# Patient Record
Sex: Female | Born: 1940
Health system: Southern US, Community
[De-identification: ages and names within clinical notes are randomized; demographics above are authoritative.]

## PROBLEM LIST (undated history)

## (undated) DIAGNOSIS — J189 Pneumonia, unspecified organism: Secondary | ICD-10-CM

## (undated) DIAGNOSIS — I10 Essential (primary) hypertension: Secondary | ICD-10-CM

## (undated) DIAGNOSIS — C801 Malignant (primary) neoplasm, unspecified: Secondary | ICD-10-CM

## (undated) DIAGNOSIS — D509 Iron deficiency anemia, unspecified: Secondary | ICD-10-CM

## (undated) DIAGNOSIS — K922 Gastrointestinal hemorrhage, unspecified: Secondary | ICD-10-CM

## (undated) DIAGNOSIS — E7800 Pure hypercholesterolemia, unspecified: Secondary | ICD-10-CM

## (undated) DIAGNOSIS — K219 Gastro-esophageal reflux disease without esophagitis: Secondary | ICD-10-CM

## (undated) HISTORY — PX: COLONOSCOPY: SHX174

## (undated) HISTORY — PX: TONSILLECTOMY: SUR1361

## (undated) HISTORY — DX: Pneumonia, unspecified organism: J18.9

---

## 1998-12-08 ENCOUNTER — Inpatient Hospital Stay (HOSPITAL_COMMUNITY): Admission: AD | Admit: 1998-12-08 | Discharge: 1998-12-09 | Payer: Self-pay | Admitting: Family Medicine

## 1998-12-08 ENCOUNTER — Encounter: Payer: Self-pay | Admitting: Family Medicine

## 1998-12-11 ENCOUNTER — Encounter: Admission: RE | Admit: 1998-12-11 | Discharge: 1998-12-11 | Payer: Self-pay | Admitting: Family Medicine

## 2000-11-07 ENCOUNTER — Inpatient Hospital Stay (HOSPITAL_COMMUNITY): Admission: EM | Admit: 2000-11-07 | Discharge: 2000-11-08 | Payer: Self-pay | Admitting: *Deleted

## 2000-11-07 ENCOUNTER — Encounter: Payer: Self-pay | Admitting: Emergency Medicine

## 2010-07-05 HISTORY — PX: TOTAL HIP ARTHROPLASTY: SHX124

## 2016-01-05 ENCOUNTER — Observation Stay (HOSPITAL_COMMUNITY)
Admission: AD | Admit: 2016-01-05 | Discharge: 2016-01-07 | Disposition: A | Discharge status: Home / Self Care | Payer: Commercial Managed Care - HMO | Source: Other Acute Inpatient Hospital | Attending: Internal Medicine | Admitting: Internal Medicine

## 2016-01-05 DIAGNOSIS — I159 Secondary hypertension, unspecified: Secondary | ICD-10-CM | POA: Diagnosis not present

## 2016-01-05 DIAGNOSIS — K922 Gastrointestinal hemorrhage, unspecified: Principal | ICD-10-CM | POA: Diagnosis present

## 2016-01-05 DIAGNOSIS — D62 Acute posthemorrhagic anemia: Secondary | ICD-10-CM | POA: Diagnosis present

## 2016-01-05 DIAGNOSIS — I10 Essential (primary) hypertension: Secondary | ICD-10-CM | POA: Diagnosis present

## 2016-01-05 DIAGNOSIS — Z96641 Presence of right artificial hip joint: Secondary | ICD-10-CM | POA: Diagnosis not present

## 2016-01-05 HISTORY — DX: Essential (primary) hypertension: I10

## 2016-01-06 ENCOUNTER — Encounter (HOSPITAL_COMMUNITY): Payer: Self-pay | Admitting: Family Medicine

## 2016-01-06 ENCOUNTER — Ambulatory Visit: Payer: Self-pay | Admitting: Gastroenterology

## 2016-01-06 DIAGNOSIS — I10 Essential (primary) hypertension: Secondary | ICD-10-CM | POA: Diagnosis present

## 2016-01-06 DIAGNOSIS — D509 Iron deficiency anemia, unspecified: Secondary | ICD-10-CM | POA: Diagnosis not present

## 2016-01-06 DIAGNOSIS — Z96641 Presence of right artificial hip joint: Secondary | ICD-10-CM | POA: Diagnosis not present

## 2016-01-06 DIAGNOSIS — D62 Acute posthemorrhagic anemia: Secondary | ICD-10-CM | POA: Diagnosis present

## 2016-01-06 DIAGNOSIS — K922 Gastrointestinal hemorrhage, unspecified: Secondary | ICD-10-CM | POA: Diagnosis present

## 2016-01-06 LAB — CBC
HEMATOCRIT: 27.5 % — AB (ref 36.0–46.0)
HEMATOCRIT: 29.2 % — AB (ref 36.0–46.0)
HEMOGLOBIN: 9.1 g/dL — AB (ref 12.0–15.0)
Hemoglobin: 8.6 g/dL — ABNORMAL LOW (ref 12.0–15.0)
MCH: 24 pg — ABNORMAL LOW (ref 26.0–34.0)
MCH: 23.9 pg — AB (ref 26.0–34.0)
MCHC: 31.3 g/dL (ref 30.0–36.0)
MCHC: 31.2 g/dL (ref 30.0–36.0)
MCV: 76.6 fL — ABNORMAL LOW (ref 78.0–100.0)
MCV: 76.6 fL — ABNORMAL LOW (ref 78.0–100.0)
Platelets: 174 10*3/uL (ref 150–400)
Platelets: 175 10*3/uL (ref 150–400)
RBC: 3.59 MIL/uL — AB (ref 3.87–5.11)
RBC: 3.81 MIL/uL — AB (ref 3.87–5.11)
RDW: 16.3 % — AB (ref 11.5–15.5)
RDW: 16.4 % — ABNORMAL HIGH (ref 11.5–15.5)
WBC: 4 10*3/uL (ref 4.0–10.5)
WBC: 4.4 10*3/uL (ref 4.0–10.5)

## 2016-01-06 LAB — MAGNESIUM: MAGNESIUM: 2 mg/dL (ref 1.7–2.4)

## 2016-01-06 LAB — BASIC METABOLIC PANEL
Anion gap: 6 (ref 5–15)
BUN: 12 mg/dL (ref 6–20)
CHLORIDE: 111 mmol/L (ref 101–111)
CO2: 24 mmol/L (ref 22–32)
CREATININE: 0.78 mg/dL (ref 0.44–1.00)
Calcium: 8.4 mg/dL — ABNORMAL LOW (ref 8.9–10.3)
GFR calc Af Amer: >60 mL/min (ref >60)
GLUCOSE: 106 mg/dL — AB (ref 65–99)
Potassium: 3.1 mmol/L — ABNORMAL LOW (ref 3.5–5.1)
SODIUM: 141 mmol/L (ref 135–145)

## 2016-01-06 LAB — GLUCOSE, CAPILLARY: GLUCOSE-CAPILLARY: 102 mg/dL — AB (ref 65–99)

## 2016-01-06 MED ORDER — ACETAMINOPHEN 325 MG PO TABS: 650.0000 mg | ORAL_TABLET | Freq: Four times a day (QID) | ORAL | Status: DC | PRN

## 2016-01-06 MED ORDER — ACETAMINOPHEN 650 MG RE SUPP: 650.0000 mg | Freq: Four times a day (QID) | RECTAL | Status: DC | PRN

## 2016-01-06 MED ORDER — POLYETHYLENE GLYCOL 3350 17 GM/SCOOP PO POWD
1.0000 | Freq: Once | ORAL | Status: AC
  Administered 2016-01-06: 255 g via ORAL
  Filled 2016-01-06: qty 255

## 2016-01-06 MED ORDER — SODIUM CHLORIDE 0.9 % IV SOLN
INTRAVENOUS | Status: DC
  Administered 2016-01-06: 13:00:00 via INTRAVENOUS

## 2016-01-06 MED ORDER — ONDANSETRON HCL 4 MG/2ML IJ SOLN: 4.0000 mg | Freq: Four times a day (QID) | INTRAMUSCULAR | Status: DC | PRN

## 2016-01-06 MED ORDER — POTASSIUM CHLORIDE CRYS ER 20 MEQ PO TBCR
40.0000 meq | EXTENDED_RELEASE_TABLET | ORAL | Status: AC
  Administered 2016-01-06 (×2): 40 meq via ORAL
  Filled 2016-01-06: qty 2
  Filled 2016-01-06: qty 4

## 2016-01-06 MED ORDER — SODIUM CHLORIDE 0.9 % IV SOLN
INTRAVENOUS | Status: DC
  Administered 2016-01-06: 03:00:00 via INTRAVENOUS

## 2016-01-06 MED ORDER — ONDANSETRON HCL 4 MG PO TABS
4.0000 mg | ORAL_TABLET | Freq: Four times a day (QID) | ORAL | Status: DC | PRN
  Administered 2016-01-06: 4 mg via ORAL
  Filled 2016-01-06: qty 1

## 2016-01-06 MED ORDER — AMLODIPINE BESYLATE 5 MG PO TABS
5.0000 mg | ORAL_TABLET | Freq: Every day | ORAL | Status: DC
  Administered 2016-01-06 – 2016-01-07 (×2): 5 mg via ORAL
  Filled 2016-01-06 (×3): qty 1

## 2016-01-06 MED ORDER — PEG 3350-KCL-NA BICARB-NACL 420 G PO SOLR
4000.0000 mL | Freq: Once | ORAL | Status: DC
Start: 2016-01-06 — End: 2016-01-06
  Administered 2016-01-06: 4000 mL via ORAL
  Filled 2016-01-06: qty 4000

## 2016-01-06 NOTE — H&P (Signed)
History and Physical  Patient Name: Elizabeth Durham     IBB:[id number]    DOB: 03-03-1941    DOA: 01/05/2016 PCP: Arlyss Queen   Patient coming from: Home  Chief Complaint: Rectal bleeding  HPI: Elizabeth Durham is a 75 y.o. female with a past medical history significant for HTN who presents with rectal bleeding.  The patient was in her usual state of health until last week when she was wiping and "there was a blood clot on the toilet tissue."  This resolved that day, but two days ago, she had several loose bowel movements and there was bright red blood "squirting in the bowl" and filling the commode.  Then yesterday (Monday) she had blood mixed with the bowel movement, went to her PCP, who sent her straight to the ER.  ED course: -Afebrile, hemodynamically stable, hypertensive, saturating well on room air -Na 142, K 3.3, BUN 22, Cr 0.9, Hgb 8.3 -She was given a PPI, the case was declined by the local hospitalist given lack of GI coverage at [facility], discussed with Dr. Matthias Hughs of [facility], and transferred after transfusion of 1U PRBCs -She had one further bright red bowel movement on arrival to [facility], not observed by nursing     ROS: Pt complains of rectal bleeding, loose stools.  Pt denies any melena, hematemesis, dyspnea on exertion, chest pressure, abdominal discomfort.    All other systems negative except as just noted or noted in the history of present illness.    Past Medical History  Diagnosis Date  . Essential hypertension     Past Surgical History  Procedure Laterality Date  . Total hip arthroplasty Right 2012    Social History: Patient lives With her brother.  The patient walks unassisted. She does not smoke. She is not using alcohol. She does not use a cane. She does not work. Her daughter lives in [place].    No Known Allergies  Family history: family history includes Leukemia in her mother.  Prior to Admission medications   Not on File        Physical Exam: BP 194/96 mmHg  Pulse 63  Temp(Src) 98 F (36.7 C) (Oral)  Resp 16  Ht 5' 4.8" (1.646 m)  Wt 51.256 kg (113 lb)  BMI 18.92 kg/m2  SpO2 100% General appearance: Well-developed, elderly female, alert and in no acute distress.   Eyes: Conjunctiva normal, lids and lashes normal.   PERRL, right esotropia.  ENT: No nasal deformity, discharge.  OP moist without lesions.  Dentures in place. Lymph: No cervical or supraclavicular lymphadenopathy. Skin: Warm and dry.  No suspicious rashes or lesions. Cardiac: RRR, nl S1-S2, no murmurs appreciated.  Capillary refill is brisk.  JVP normal.  No LE edema.  Radial and DP pulses 2+ and symmetric. Respiratory: Normal respiratory rate and rhythm.  CTAB without rales or wheezes. GI: Abdomen soft without rigidity.  No TTP. No ascites, distension, hepatosplenomegaly.   Rectal: There is a small noninflamed exterior hemorrhoid, bright red blood around the anus. MSK: No deformities or effusions.  No clubbing/cyanosis. Neuro: Cranial nerves normal.  Sensorium intact and responding to questions, attention normal.  Speech is fluent.  Moves all extremities equally and with normal coordination.    Psych: Affect normal.  Judgment and insight appear normal.       Labs on Admission:  I have personally reviewed following labs and imaging studies: CBC: No results for input(s): WBC, NEUTROABS, HGB, HCT, MCV, PLT in the last 168  hours. Basic Metabolic Panel: No results for input(s): NA, K, CL, CO2, GLUCOSE, BUN, CREATININE, CALCIUM, MG, PHOS in the last 168 hours. GFR: CrCl cannot be calculated (Patient has no serum creatinine result on file.).  Liver Function Tests: No results for input(s): AST, ALT, ALKPHOS, BILITOT, PROT, ALBUMIN in the last 168 hours. No results for input(s): LIPASE, AMYLASE in the last 168 hours. No results for input(s): AMMONIA in the last 168 hours. Coagulation Profile: No results for input(s): INR, PROTIME in the  last 168 hours. Cardiac Enzymes: No results for input(s): CKTOTAL, CKMB, CKMBINDEX, TROPONINI in the last 168 hours. BNP (last 3 results) No results for input(s): PROBNP in the last 8760 hours. HbA1C: No results for input(s): HGBA1C in the last 72 hours. CBG: No results for input(s): GLUCAP in the last 168 hours. Lipid Profile: No results for input(s): CHOL, HDL, LDLCALC, TRIG, CHOLHDL, LDLDIRECT in the last 72 hours. Thyroid Function Tests: No results for input(s): TSH, T4TOTAL, FREET4, T3FREE, THYROIDAB in the last 72 hours. Anemia Panel: No results for input(s): VITAMINB12, FOLATE, FERRITIN, TIBC, IRON, RETICCTPCT in the last 72 hours. Sepsis Labs:  Invalid input(s): PROCALCITONIN, LACTICIDVEN No results found for this or any previous visit (from the past 240 hour(s)).       Radiological Exams on Admission: Personally reviewed: No results found.      Assessment/Plan  1. GI bleed:  Diverticular bleed versus internal hemorrhoid (although nothing observed on my exam).  Ischemic or infectious colitis seem less likely. -Serial hemoglobin q12 hrs -NPO and MIVF -[facility] consulted, appreciate cares   2. HTN:  Not on medications at home.  3. Acute blood loss anemia:  Transfused 1 unit at OSH.   -Serial hemoglobins      DVT prophylaxis: SCDs  Code Status: FULL  Family Communication: Daughter at bedside  Disposition Plan: Anticipate serial hemoglobins and monitor for further bleeding.  Consult to GI, appreciate cares. Consults called: Gastroenterology called by OSH Admission status: OBS, med surg At the point of initial evaluation, it is my clinical opinion that admission for OBSERVATION is reasonable and necessary because the patient's presenting complaints in the context of their chronic conditions represent sufficient risk of deterioration or significant morbidity to constitute reasonable grounds for close observation in the hospital setting, but that the patient  may be medically stable for discharge from the hospital within 24 to 48 hours.    Medical decision making: Patient seen at 1:58 AM on 01/06/2016.  The patient was discussed with Ezra Sites, PA-C at [place]. What exists of the patient's chart was reviewed in depth.  Clinical condition: stable.        Edwin Dada [facility] Pager [phone]

## 2016-01-06 NOTE — Care Management Obs Status (Signed)
[place] NOTIFICATION   Patient Details  Name: Elizabeth Durham MRN: [id number] Date of Birth: 22-Dec-1940   Medicare Observation Status Notification Given:  Yes    Sharin Mons, RN 01/06/2016, 1:42 PM

## 2016-01-06 NOTE — Progress Notes (Signed)
I have seen and assessed the patient and agree with Dr. Sabino Niemann assessment and plan. Patient is a 75 year old female transferred from [facility] due to lack of GI coverage to [facility] for rectal bleeding. Likely diverticular versus hemorrhoidal bleeding. GI has been consulted and patient will be scheduled for inpatient colonoscopy for further evaluation tomorrow. Patient currently on clear liquid diet. Follow.

## 2016-01-06 NOTE — Progress Notes (Signed)
Pt presented with a pitcher of 20 ounces Gatorade and 1/3 the Miralax prep ordered for colonoscopy prep--pt consumed the entire amount in 1 hour and c/o being chilly and was shaking--she began the second 20 ounces of Gatorade with miralax and within a few minutes she vomited approximately 2 cups of the fluid--she states she feels a bit better=less cold and she is continuing to attemp to consume the second pitcher of prep

## 2016-01-06 NOTE — Progress Notes (Signed)
Pt unable to tolerate original ordered prep. Schooler contacted and gave verbal orders to change to Miralax prep with clears.

## 2016-01-06 NOTE — Consult Note (Signed)
Referring Provider: Dr. Janee Morn Primary Care Physician:  Arlyss Queen Primary Gastroenterologist:  Gentry Fitz  Reason for Consultation:  GI Bleed  HPI: Elizabeth Durham is a 75 y.o. female transferred from [facility] due to lack of GI coverage being seen for a consult due to rectal bleeding. Reports onset of red blood per rectum everyday last week where she would see clots with wiping. Saw dark red blood this morning since admit. Denies melena. Denies abdominal pain, nausea, vomiting, dizziness. Rare NSAIDs use. Denies alcohol. Has never had a colonoscopy. Daughter at bedside. Hgb 8.6.    Past Medical History  Diagnosis Date  . Essential hypertension     Past Surgical History  Procedure Laterality Date  . Total hip arthroplasty Right 2012    Prior to Admission medications   Medication Sig Start Date End Date Taking? Authorizing Provider  naproxen sodium (ANAPROX) 220 MG tablet Take 220 mg by mouth 2 (two) times daily with a meal.   Yes Historical Provider, MD    Scheduled Meds: . potassium chloride  40 mEq Oral Q4H   Continuous Infusions: . sodium chloride 100 mL/hr at 01/06/16 0250   PRN Meds:.acetaminophen **OR** acetaminophen, ondansetron **OR** ondansetron (ZOFRAN) IV  Allergies as of 01/05/2016  . (Not on File)    Family History  Problem Relation Age of Onset  . Leukemia Mother     Social History   Social History  . Marital Status: Married    Spouse Name: N/A  . Number of Children: N/A  . Years of Education: N/A   Occupational History  . Not on file.   Social History Main Topics  . Smoking status: Never Smoker   . Smokeless tobacco: Not on file  . Alcohol Use: No  . Drug Use: Not on file  . Sexual Activity: Not on file   Other Topics Concern  . Not on file   Social History Narrative  . No narrative on file    Review of Systems: All negative except as stated above in HPI.  Physical Exam: Vital signs: Filed Vitals:   01/06/16  0100 01/06/16 0530  BP:  132/69  Pulse: 69 56  Temp:  98.1 F (36.7 C)  Resp:  16   Last BM Date: 01/05/16 General:   Alert,  Thin, elderly, pleasant and cooperative in NAD HEENT: anicteric sclera, oropharynx clear Neck: supple, nontender Lungs:  Clear throughout to auscultation.   No wheezes, crackles, or rhonchi. No acute distress. Heart:  Regular rate and rhythm; no murmurs, clicks, rubs,  or gallops. Abdomen: soft, nontender, nondistended, +BS  Rectal:  Deferred Ext: no edema  GI:  Lab Results:  Recent Labs  01/06/16 0526  WBC 4.0  HGB 8.6*  HCT 27.5*  PLT 174   BMET  Recent Labs  01/06/16 0526  NA 141  K 3.1*  CL 111  CO2 24  GLUCOSE 106*  BUN 12  CREATININE 0.78  CALCIUM 8.4*   LFT No results for input(s): PROT, ALBUMIN, AST, ALT, ALKPHOS, BILITOT, BILIDIR, IBILI in the last 72 hours. PT/INR No results for input(s): LABPROT, INR in the last 72 hours.   Studies/Results: No results found.  Impression/Plan: Rectal bleeding likely diverticular in origin but malignancy still possible. Will recommend an inpt colonoscopy to further evaluate. Clear liquid diet. Colon prep this afternoon. Colonoscopy tomorrow. NPO p MN.    LOS: 1 day   Stuart Guillen C.  01/06/2016, 11:28 AM  Pager [phone]  If no answer or after 5 PM  call [phone]

## 2016-01-07 ENCOUNTER — Encounter (HOSPITAL_COMMUNITY)
Admission: AD | Disposition: A | Discharge status: Home / Self Care | Payer: Self-pay | Source: Other Acute Inpatient Hospital | Attending: Internal Medicine

## 2016-01-07 DIAGNOSIS — D62 Acute posthemorrhagic anemia: Secondary | ICD-10-CM | POA: Diagnosis not present

## 2016-01-07 DIAGNOSIS — Z96641 Presence of right artificial hip joint: Secondary | ICD-10-CM | POA: Diagnosis not present

## 2016-01-07 DIAGNOSIS — K922 Gastrointestinal hemorrhage, unspecified: Secondary | ICD-10-CM

## 2016-01-07 DIAGNOSIS — D509 Iron deficiency anemia, unspecified: Secondary | ICD-10-CM | POA: Diagnosis not present

## 2016-01-07 DIAGNOSIS — I10 Essential (primary) hypertension: Secondary | ICD-10-CM | POA: Diagnosis not present

## 2016-01-07 LAB — BASIC METABOLIC PANEL
ANION GAP: 8 (ref 5–15)
BUN: 9 mg/dL (ref 6–20)
CHLORIDE: 108 mmol/L (ref 101–111)
CO2: 25 mmol/L (ref 22–32)
CREATININE: 0.9 mg/dL (ref 0.44–1.00)
Calcium: 9.5 mg/dL (ref 8.9–10.3)
GFR calc non Af Amer: >60 mL/min (ref >60)
GLUCOSE: 110 mg/dL — AB (ref 65–99)
Potassium: 4.1 mmol/L (ref 3.5–5.1)
Sodium: 141 mmol/L (ref 135–145)

## 2016-01-07 LAB — CBC
HEMATOCRIT: 32.3 % — AB (ref 36.0–46.0)
HEMOGLOBIN: 9.9 g/dL — AB (ref 12.0–15.0)
MCH: 24 pg — ABNORMAL LOW (ref 26.0–34.0)
MCHC: 30.7 g/dL (ref 30.0–36.0)
MCV: 78.2 fL (ref 78.0–100.0)
Platelets: 202 10*3/uL (ref 150–400)
RBC: 4.13 MIL/uL (ref 3.87–5.11)
RDW: 16.3 % — AB (ref 11.5–15.5)
WBC: 6.4 10*3/uL (ref 4.0–10.5)

## 2016-01-07 LAB — GLUCOSE, CAPILLARY
Glucose-Capillary: 110 mg/dL — ABNORMAL HIGH (ref 65–99)
Glucose-Capillary: 102 mg/dL — ABNORMAL HIGH (ref 65–99)

## 2016-01-07 LAB — MAGNESIUM: Magnesium: 2 mg/dL (ref 1.7–2.4)

## 2016-01-07 SURGERY — COLONOSCOPY
Anesthesia: Monitor Anesthesia Care

## 2016-01-07 MED ORDER — AMLODIPINE BESYLATE 5 MG PO TABS: 5.0000 mg | ORAL_TABLET | Freq: Every day | ORAL | Status: DC

## 2016-01-07 MED ORDER — PROMETHAZINE HCL 12.5 MG PO TABS: 12.5000 mg | ORAL_TABLET | Freq: Four times a day (QID) | ORAL | Status: DC | PRN

## 2016-01-07 MED ORDER — PANTOPRAZOLE SODIUM 40 MG PO TBEC: 40.0000 mg | DELAYED_RELEASE_TABLET | Freq: Every day | ORAL | Status: DC

## 2016-01-07 NOTE — Progress Notes (Signed)
Patient ID: Elizabeth Durham, female   DOB: 1941-06-18, 75 y.o.   MRN: [id number] [facility] Gastroenterology Progress Note  Elizabeth Durham 75 y.o. 27-Sep-1940   Subjective: Could not tolerate colon preps without vomiting and no BMs. Denies further bleeding. Feels ok now.  Objective: Vital signs in last 24 hours: Filed Vitals:   01/06/16 2123 01/07/16 0619  BP: 139/79 134/77  Pulse: 61 66  Temp: 98 F (36.7 C) 98.4 F (36.9 C)  Resp: 18 18    Physical Exam: Gen: alert, no acute distress CV: RRR Chest: CTA B Abd: soft, nontender, nondistended, +BS  Lab Results:  Recent Labs  01/06/16 0526 01/06/16 0840 01/07/16 0528  NA 141  --  141  K 3.1*  --  4.1  CL 111  --  108  CO2 24  --  25  GLUCOSE 106*  --  110*  BUN 12  --  9  CREATININE 0.78  --  0.90  CALCIUM 8.4*  --  9.5  MG  --  2.0 2.0   No results for input(s): AST, ALT, ALKPHOS, BILITOT, PROT, ALBUMIN in the last 72 hours.  Recent Labs  01/06/16 1631 01/07/16 0528  WBC 4.4 6.4  HGB 9.1* 9.9*  HCT 29.2* 32.3*  MCV 76.6* 78.2  PLT 175 202   No results for input(s): LABPROT, INR in the last 72 hours.    Assessment/Plan: Resolved lower GI bleed likely diverticular. Hgb stable . Colonoscopy cancelled due to inability to tolerate prep and since bleeding has resolved will do procedure as an outpt with a lower volume prep. Changed to regular diet. Ok to go home if she can tolerate lunch without vomiting. Will have office set her up to see me in August to arrange outpt colon. Will sign off. Call if questions.   Crandon C. 01/07/2016, 9:17 AM  Pager [phone]  If no answer or after 5 PM call [phone]

## 2016-01-07 NOTE — Progress Notes (Signed)
Nsg Discharge Note  Admit Date:  01/05/2016 Discharge date: 01/07/2016   Elizabeth Durham to be D/C'd Home per MD order.  AVS completed.  Copy for chart, and copy for patient signed, and dated. Patient/caregiver able to verbalize understanding.  Discharge Medication:   Medication List    STOP taking these medications        naproxen sodium 220 MG tablet  Commonly known as:  ANAPROX      TAKE these medications        amLODipine 5 MG tablet  Commonly known as:  NORVASC  Take 1 tablet (5 mg total) by mouth daily.     pantoprazole 40 MG tablet  Commonly known as:  PROTONIX  Take 1 tablet (40 mg total) by mouth daily.     promethazine 12.5 MG tablet  Commonly known as:  PHENERGAN  Take 1 tablet (12.5 mg total) by mouth every 6 (six) hours as needed for nausea or vomiting.        Discharge Assessment: Filed Vitals:   01/06/16 2123 01/07/16 0619  BP: 139/79 134/77  Pulse: 61 66  Temp: 98 F (36.7 C) 98.4 F (36.9 C)  Resp: 18 18   Skin clean, dry and intact without evidence of skin break down, no evidence of skin tears noted. IV catheter discontinued intact. Site without signs and symptoms of complications - no redness or edema noted at insertion site, patient denies c/o pain - only slight tenderness at site.  Dressing with slight pressure applied.  D/c Instructions-Education: Discharge instructions given to patient/family with verbalized understanding. D/c education completed with patient/family including follow up instructions, medication list, d/c activities limitations if indicated, with other d/c instructions as indicated by MD - patient able to verbalize understanding, all questions fully answered. Patient instructed to return to ED, call 911, or call MD for any changes in condition.  Patient escorted via [place], and D/C home via private auto.  Dayle Points, RN 01/07/2016 1:21 PM

## 2016-01-07 NOTE — Progress Notes (Signed)
Call to Dr Michail Sermon at this time to report pt was unsuccessful in completing the oral prep for her colonoscopy scheduled for today.  Pt did make a serious attempt to complete the prep but did nothing but vomit it all back up until she finally refused to try anymore.  Dr Michail Sermon stated he wi;; call Endo to cancel the procedure.

## 2016-01-07 NOTE — Discharge Summary (Signed)
Physician Discharge Summary   Patient ID: SADIELYNN ONION MRN: [id number] DOB/AGE: [birth year] 75 y.o.  Admit date: 01/05/2016 Discharge date: 01/07/2016  Primary Care Physician:  Fae Pippin  Discharge Diagnoses:    . Essential hypertension . GI bleed . Acute blood loss anemia  Consults:Gastroenterology  Recommendations for Outpatient Follow-up:  1. Patient to follow up with GI, Dr. Michail Sermon outpatient in 2 weeks to schedule colonoscopy 2. Please repeat CBC/BMET at next visit 3. Please note patient started on amlodipine this admission 5 mg daily naproxen has been discontinued 4. Patient also placed on PPI daily   DIET: Heart healthy diet    Allergies:  No Known Allergies   DISCHARGE MEDICATIONS: Current Discharge Medication List    START taking these medications   Details  amLODipine (NORVASC) 5 MG tablet Take 1 tablet (5 mg total) by mouth daily. Qty: 30 tablet, Refills: 3    pantoprazole (PROTONIX) 40 MG tablet Take 1 tablet (40 mg total) by mouth daily. Qty: 30 tablet, Refills: 0    promethazine (PHENERGAN) 12.5 MG tablet Take 1 tablet (12.5 mg total) by mouth every 6 (six) hours as needed for nausea or vomiting. Qty: 30 tablet, Refills: 0      STOP taking these medications     naproxen sodium (ANAPROX) 220 MG tablet          Brief H and P: For complete details please refer to admission H and P, but in brief Patient is a 75 year old female with hypertension presented with rectal bleeding. Patient was in her usual state of health until last week when she noticed she was wiping and there was bleeding on the toilet tissue. This was resolved however 2 days before the admission she had several loose bowel movements with bright red blood. Patient went to her PCP and was sent to the ER. Patient was transferred from [facility]. She was admitted for further workup.  Hospital Course:     GI bleed: Possibly diverticular bleeding versus internal  hemorrhoid, resolved.  - Bleeding has resolved. Serial hemoglobin was checked and remained stable. Hemoglobin 9.9 at the time of discharge. GI was consulted and recommended colonoscopy. Patient however was not able to tolerate the colonoscopy prep and since the bleeding had resolved, GI, Dr. Michail Sermon recommended outpatient colonoscopy at this time. She is not tolerating diet without any difficulty and will be discharged home. Naproxen was discontinued and patient was placed on PPI.     Essential hypertension -Patient was started on Norvasc     Acute blood loss anemia Likely due to #1, hemoglobin has remained stable   Day of Discharge BP 134/77 mmHg  Pulse 66  Temp(Src) 98.4 F (36.9 C) (Oral)  Resp 18  Ht 5' 4.8" (1.646 m)  Wt 51.256 kg (113 lb)  BMI 18.92 kg/m2  SpO2 100%  Physical Exam: General: Alert and awake oriented x3 not in any acute distress. HEENT: anicteric sclera, pupils reactive to light and accommodation CVS: S1-S2 clear no murmur rubs or gallops Chest: clear to auscultation bilaterally, no wheezing rales or rhonchi Abdomen: soft nontender, nondistended, normal bowel sounds Extremities: no cyanosis, clubbing or edema noted bilaterally Neuro: Cranial nerves II-XII intact, no focal neurological deficits   The results of significant diagnostics from this hospitalization (including imaging, microbiology, ancillary and laboratory) are listed below for reference.    LAB RESULTS: Basic Metabolic Panel:  Recent Labs Lab 01/06/16 0526  01/07/16 0528  NA 141  --  141  K 3.1*  --  4.1  CL 111  --  108  CO2 24  --  25  GLUCOSE 106*  --  110*  BUN 12  --  9  CREATININE 0.78  --  0.90  CALCIUM 8.4*  --  9.5  MG  --   < > 2.0  < > = values in this interval not displayed. Liver Function Tests: No results for input(s): AST, ALT, ALKPHOS, BILITOT, PROT, ALBUMIN in the last 168 hours. No results for input(s): LIPASE, AMYLASE in the last 168 hours. No results for  input(s): AMMONIA in the last 168 hours. CBC:  Recent Labs Lab 01/06/16 1631 01/07/16 0528  WBC 4.4 6.4  HGB 9.1* 9.9*  HCT 29.2* 32.3*  MCV 76.6* 78.2  PLT 175 202   Cardiac Enzymes: No results for input(s): CKTOTAL, CKMB, CKMBINDEX, TROPONINI in the last 168 hours. BNP: Invalid input(s): POCBNP CBG:  Recent Labs Lab 01/07/16 0805 01/07/16 1156  GLUCAP 110* 102*    Significant Diagnostic Studies:  No results found.  2D ECHO:   Disposition and Follow-up:    DISPOSITION:Home  DISCHARGE FOLLOW-UP Follow-up Information    Follow up with SCHOOLER,VINCENT C., MD. Schedule an appointment as soon as possible for a visit in 2 weeks.   Specialty:  Gastroenterology   Why:  for hospital follow-up   Contact information:   [address] [place] [address] [phone]       Follow up with Cyndi Bender, PA-C. Schedule an appointment as soon as possible for a visit in 2 weeks.   Specialty:  Physician Assistant   Why:  for hospital follow-up   Contact information:   [place] [address] [phone]        Time spent on Discharge: 77mins   Signed:   Dave Mannes M.D. [facility] 01/07/2016, 12:45 PM Pager: [phone]

## 2016-01-07 NOTE — Progress Notes (Signed)
Pt has continued to sip at the colonoscopy prep and has experienced 2 vomiting episodes since--she has not had a BM and is refusing at this time to drink anymore

## 2016-01-21 DIAGNOSIS — D649 Anemia, unspecified: Secondary | ICD-10-CM | POA: Diagnosis not present

## 2016-01-21 DIAGNOSIS — I10 Essential (primary) hypertension: Secondary | ICD-10-CM | POA: Diagnosis not present

## 2016-01-21 DIAGNOSIS — Z79899 Other long term (current) drug therapy: Secondary | ICD-10-CM | POA: Diagnosis not present

## 2016-01-21 DIAGNOSIS — K922 Gastrointestinal hemorrhage, unspecified: Secondary | ICD-10-CM | POA: Diagnosis not present

## 2016-01-21 DIAGNOSIS — Z1389 Encounter for screening for other disorder: Secondary | ICD-10-CM | POA: Diagnosis not present

## 2016-01-21 DIAGNOSIS — Z9181 History of falling: Secondary | ICD-10-CM | POA: Diagnosis not present

## 2016-02-05 DIAGNOSIS — D509 Iron deficiency anemia, unspecified: Secondary | ICD-10-CM | POA: Diagnosis not present

## 2016-05-07 DIAGNOSIS — K922 Gastrointestinal hemorrhage, unspecified: Secondary | ICD-10-CM | POA: Diagnosis not present

## 2016-05-07 DIAGNOSIS — I10 Essential (primary) hypertension: Secondary | ICD-10-CM | POA: Diagnosis not present

## 2016-05-07 DIAGNOSIS — Z681 Body mass index (BMI) 19 or less, adult: Secondary | ICD-10-CM | POA: Diagnosis not present

## 2016-05-07 DIAGNOSIS — D509 Iron deficiency anemia, unspecified: Secondary | ICD-10-CM | POA: Diagnosis not present

## 2016-09-21 DIAGNOSIS — Z6820 Body mass index (BMI) 20.0-20.9, adult: Secondary | ICD-10-CM | POA: Diagnosis not present

## 2016-09-21 DIAGNOSIS — R509 Fever, unspecified: Secondary | ICD-10-CM | POA: Diagnosis not present

## 2016-09-21 DIAGNOSIS — J209 Acute bronchitis, unspecified: Secondary | ICD-10-CM | POA: Diagnosis not present

## 2016-11-08 DIAGNOSIS — Z681 Body mass index (BMI) 19 or less, adult: Secondary | ICD-10-CM | POA: Diagnosis not present

## 2016-11-08 DIAGNOSIS — D509 Iron deficiency anemia, unspecified: Secondary | ICD-10-CM | POA: Diagnosis not present

## 2016-11-08 DIAGNOSIS — K922 Gastrointestinal hemorrhage, unspecified: Secondary | ICD-10-CM | POA: Diagnosis not present

## 2016-11-08 DIAGNOSIS — I10 Essential (primary) hypertension: Secondary | ICD-10-CM | POA: Diagnosis not present

## 2016-12-08 DIAGNOSIS — S92312A Displaced fracture of first metatarsal bone, left foot, initial encounter for closed fracture: Secondary | ICD-10-CM | POA: Diagnosis not present

## 2016-12-08 DIAGNOSIS — S92902A Unspecified fracture of left foot, initial encounter for closed fracture: Secondary | ICD-10-CM | POA: Diagnosis not present

## 2016-12-08 DIAGNOSIS — S92232A Displaced fracture of intermediate cuneiform of left foot, initial encounter for closed fracture: Secondary | ICD-10-CM | POA: Diagnosis not present

## 2016-12-08 DIAGNOSIS — S92242A Displaced fracture of medial cuneiform of left foot, initial encounter for closed fracture: Secondary | ICD-10-CM | POA: Diagnosis not present

## 2016-12-17 DIAGNOSIS — S92245A Nondisplaced fracture of medial cuneiform of left foot, initial encounter for closed fracture: Secondary | ICD-10-CM | POA: Diagnosis not present

## 2016-12-22 DIAGNOSIS — S92315A Nondisplaced fracture of first metatarsal bone, left foot, initial encounter for closed fracture: Secondary | ICD-10-CM | POA: Diagnosis not present

## 2016-12-22 DIAGNOSIS — S92325A Nondisplaced fracture of second metatarsal bone, left foot, initial encounter for closed fracture: Secondary | ICD-10-CM | POA: Diagnosis not present

## 2016-12-23 DIAGNOSIS — S92315A Nondisplaced fracture of first metatarsal bone, left foot, initial encounter for closed fracture: Secondary | ICD-10-CM | POA: Diagnosis not present

## 2016-12-23 DIAGNOSIS — S92325A Nondisplaced fracture of second metatarsal bone, left foot, initial encounter for closed fracture: Secondary | ICD-10-CM | POA: Diagnosis not present

## 2017-01-19 DIAGNOSIS — S92325D Nondisplaced fracture of second metatarsal bone, left foot, subsequent encounter for fracture with routine healing: Secondary | ICD-10-CM | POA: Diagnosis not present

## 2017-01-19 DIAGNOSIS — S92245D Nondisplaced fracture of medial cuneiform of left foot, subsequent encounter for fracture with routine healing: Secondary | ICD-10-CM | POA: Diagnosis not present

## 2017-01-19 DIAGNOSIS — S92315D Nondisplaced fracture of first metatarsal bone, left foot, subsequent encounter for fracture with routine healing: Secondary | ICD-10-CM | POA: Diagnosis not present

## 2017-01-25 DIAGNOSIS — Z681 Body mass index (BMI) 19 or less, adult: Secondary | ICD-10-CM | POA: Diagnosis not present

## 2017-01-25 DIAGNOSIS — R600 Localized edema: Secondary | ICD-10-CM | POA: Diagnosis not present

## 2017-01-25 DIAGNOSIS — M810 Age-related osteoporosis without current pathological fracture: Secondary | ICD-10-CM | POA: Diagnosis not present

## 2017-01-25 DIAGNOSIS — Z780 Asymptomatic menopausal state: Secondary | ICD-10-CM | POA: Diagnosis not present

## 2017-01-25 DIAGNOSIS — Z1231 Encounter for screening mammogram for malignant neoplasm of breast: Secondary | ICD-10-CM | POA: Diagnosis not present

## 2017-02-08 DIAGNOSIS — R600 Localized edema: Secondary | ICD-10-CM | POA: Diagnosis not present

## 2017-02-08 DIAGNOSIS — R636 Underweight: Secondary | ICD-10-CM | POA: Diagnosis not present

## 2017-02-08 DIAGNOSIS — Z9181 History of falling: Secondary | ICD-10-CM | POA: Diagnosis not present

## 2017-02-08 DIAGNOSIS — Z1389 Encounter for screening for other disorder: Secondary | ICD-10-CM | POA: Diagnosis not present

## 2017-02-08 DIAGNOSIS — Z139 Encounter for screening, unspecified: Secondary | ICD-10-CM | POA: Diagnosis not present

## 2017-02-08 DIAGNOSIS — Z681 Body mass index (BMI) 19 or less, adult: Secondary | ICD-10-CM | POA: Diagnosis not present

## 2017-02-16 DIAGNOSIS — S92315D Nondisplaced fracture of first metatarsal bone, left foot, subsequent encounter for fracture with routine healing: Secondary | ICD-10-CM | POA: Diagnosis not present

## 2017-02-16 DIAGNOSIS — S92245D Nondisplaced fracture of medial cuneiform of left foot, subsequent encounter for fracture with routine healing: Secondary | ICD-10-CM | POA: Diagnosis not present

## 2017-02-16 DIAGNOSIS — S92325D Nondisplaced fracture of second metatarsal bone, left foot, subsequent encounter for fracture with routine healing: Secondary | ICD-10-CM | POA: Diagnosis not present

## 2017-02-21 DIAGNOSIS — M810 Age-related osteoporosis without current pathological fracture: Secondary | ICD-10-CM | POA: Diagnosis not present

## 2017-02-21 DIAGNOSIS — Z780 Asymptomatic menopausal state: Secondary | ICD-10-CM | POA: Diagnosis not present

## 2017-02-23 DIAGNOSIS — E785 Hyperlipidemia, unspecified: Secondary | ICD-10-CM | POA: Diagnosis not present

## 2017-02-23 DIAGNOSIS — Z1389 Encounter for screening for other disorder: Secondary | ICD-10-CM | POA: Diagnosis not present

## 2017-02-23 DIAGNOSIS — Z139 Encounter for screening, unspecified: Secondary | ICD-10-CM | POA: Diagnosis not present

## 2017-02-23 DIAGNOSIS — Z9181 History of falling: Secondary | ICD-10-CM | POA: Diagnosis not present

## 2017-02-23 DIAGNOSIS — Z0000 Encounter for general adult medical examination without abnormal findings: Secondary | ICD-10-CM | POA: Diagnosis not present

## 2017-02-23 DIAGNOSIS — Z136 Encounter for screening for cardiovascular disorders: Secondary | ICD-10-CM | POA: Diagnosis not present

## 2017-03-10 DIAGNOSIS — Z1211 Encounter for screening for malignant neoplasm of colon: Secondary | ICD-10-CM | POA: Diagnosis not present

## 2017-03-10 DIAGNOSIS — K922 Gastrointestinal hemorrhage, unspecified: Secondary | ICD-10-CM | POA: Diagnosis not present

## 2017-03-10 DIAGNOSIS — D509 Iron deficiency anemia, unspecified: Secondary | ICD-10-CM | POA: Diagnosis not present

## 2017-03-10 DIAGNOSIS — K5900 Constipation, unspecified: Secondary | ICD-10-CM | POA: Diagnosis not present

## 2017-03-22 DIAGNOSIS — E785 Hyperlipidemia, unspecified: Secondary | ICD-10-CM | POA: Diagnosis not present

## 2017-03-22 DIAGNOSIS — K635 Polyp of colon: Secondary | ICD-10-CM | POA: Diagnosis not present

## 2017-03-22 DIAGNOSIS — K219 Gastro-esophageal reflux disease without esophagitis: Secondary | ICD-10-CM | POA: Diagnosis not present

## 2017-03-22 DIAGNOSIS — C189 Malignant neoplasm of colon, unspecified: Secondary | ICD-10-CM | POA: Diagnosis not present

## 2017-03-22 DIAGNOSIS — I10 Essential (primary) hypertension: Secondary | ICD-10-CM | POA: Diagnosis not present

## 2017-03-22 DIAGNOSIS — K6389 Other specified diseases of intestine: Secondary | ICD-10-CM | POA: Diagnosis not present

## 2017-03-22 DIAGNOSIS — D649 Anemia, unspecified: Secondary | ICD-10-CM | POA: Diagnosis not present

## 2017-03-22 DIAGNOSIS — K922 Gastrointestinal hemorrhage, unspecified: Secondary | ICD-10-CM | POA: Diagnosis not present

## 2017-03-22 DIAGNOSIS — D509 Iron deficiency anemia, unspecified: Secondary | ICD-10-CM | POA: Diagnosis not present

## 2017-03-22 DIAGNOSIS — K5900 Constipation, unspecified: Secondary | ICD-10-CM | POA: Diagnosis not present

## 2017-03-22 DIAGNOSIS — D126 Benign neoplasm of colon, unspecified: Secondary | ICD-10-CM | POA: Diagnosis not present

## 2017-03-22 DIAGNOSIS — M810 Age-related osteoporosis without current pathological fracture: Secondary | ICD-10-CM | POA: Diagnosis not present

## 2017-03-30 ENCOUNTER — Other Ambulatory Visit: Payer: Self-pay | Admitting: General Surgery

## 2017-03-30 DIAGNOSIS — C187 Malignant neoplasm of sigmoid colon: Secondary | ICD-10-CM | POA: Diagnosis not present

## 2017-03-30 MED ORDER — SODIUM CHLORIDE 0.9 % IV SOLN: 1.0000 "application " | INTRAVENOUS | Status: AC

## 2017-04-01 NOTE — Pre-Procedure Instructions (Signed)
Elizabeth Durham  04/01/2017      [id number] - [address], [address] [place] [address] Phone: [phone] Fax: [phone]    Your procedure is scheduled on October 4  Report to [place] at 1200 A.M.  Call this number if you have problems the morning of surgery:  [phone]   Remember:  Do not eat food or drink liquids after midnight.  Continue all other medications as directed by your physician except follow these medication instructions before surgery   Take these medicines the morning of surgery with A SIP OF WATER amLODipine (NORVASC)  pantoprazole (PROTONIX) promethazine (PHENERGAN)  7 days prior to surgery STOP taking any Aspirin, Aleve, Naproxen, Ibuprofen, Motrin, Advil, Goody's, BC's, all herbal medications, fish oil, and all vitamins    Do not wear jewelry, make-up or nail polish.  Do not wear lotions, powders, or perfumes, or deoderant.  Do not shave 48 hours prior to surgery.  Men may shave face and neck.  Do not bring valuables to the hospital.  [facility] is not responsible for any belongings or valuables.  Contacts, dentures or bridgework may not be worn into surgery.  Leave your suitcase in the car.  After surgery it may be brought to your room.  For patients admitted to the hospital, discharge time will be determined by your treatment team.  Patients discharged the day of surgery will not be allowed to drive home.    Special instructions:   [place]- Preparing For Surgery  Before surgery, you can play an important role. Because skin is not sterile, your skin needs to be as free of germs as possible. You can reduce the number of germs on your skin by washing with CHG (chlorahexidine gluconate) Soap before surgery.  CHG is an antiseptic cleaner which kills germs and bonds with the skin to continue killing germs even after washing.  Please do not use if you  have an allergy to CHG or antibacterial soaps. If your skin becomes reddened/irritated stop using the CHG.  Do not shave (including legs and underarms) for at least 48 hours prior to first CHG shower. It is OK to shave your face.  Please follow these instructions carefully.   1. Shower the NIGHT BEFORE SURGERY and the MORNING OF SURGERY with CHG.   2. If you chose to wash your hair, wash your hair first as usual with your normal shampoo.  3. After you shampoo, rinse your hair and body thoroughly to remove the shampoo.  4. Use CHG as you would any other liquid soap. You can apply CHG directly to the skin and wash gently with a scrungie or a clean washcloth.   5. Apply the CHG Soap to your body ONLY FROM THE NECK DOWN.  Do not use on open wounds or open sores. Avoid contact with your eyes, ears, mouth and genitals (private parts). Wash genitals (private parts) with your normal soap.  6. Wash thoroughly, paying special attention to the area where your surgery will be performed.  7. Thoroughly rinse your body with warm water from the neck down.  8. DO NOT shower/wash with your normal soap after using and rinsing off the CHG Soap.  9. Pat yourself dry with a CLEAN TOWEL.   10. Wear CLEAN PAJAMAS   11. Place CLEAN SHEETS on your bed the night of your first shower and DO NOT SLEEP WITH PETS.  Day of Surgery: Do not apply any deodorants/lotions. Please wear clean clothes to the hospital/surgery center.      Please read over the following fact sheets that you were given.

## 2017-04-04 ENCOUNTER — Encounter (HOSPITAL_COMMUNITY)
Admission: RE | Admit: 2017-04-04 | Discharge: 2017-04-04 | Disposition: A | Discharge status: Home / Self Care | Payer: Medicare HMO | Source: Ambulatory Visit | Attending: General Surgery | Admitting: General Surgery

## 2017-04-04 ENCOUNTER — Encounter (HOSPITAL_COMMUNITY): Payer: Self-pay

## 2017-04-04 ENCOUNTER — Other Ambulatory Visit: Payer: Self-pay | Admitting: General Surgery

## 2017-04-04 DIAGNOSIS — M810 Age-related osteoporosis without current pathological fracture: Secondary | ICD-10-CM | POA: Diagnosis present

## 2017-04-04 DIAGNOSIS — E876 Hypokalemia: Secondary | ICD-10-CM | POA: Diagnosis not present

## 2017-04-04 DIAGNOSIS — Z87891 Personal history of nicotine dependence: Secondary | ICD-10-CM | POA: Diagnosis not present

## 2017-04-04 DIAGNOSIS — K922 Gastrointestinal hemorrhage, unspecified: Secondary | ICD-10-CM | POA: Diagnosis not present

## 2017-04-04 DIAGNOSIS — C187 Malignant neoplasm of sigmoid colon: Secondary | ICD-10-CM | POA: Diagnosis present

## 2017-04-04 DIAGNOSIS — I10 Essential (primary) hypertension: Secondary | ICD-10-CM | POA: Diagnosis present

## 2017-04-04 DIAGNOSIS — Z681 Body mass index (BMI) 19 or less, adult: Secondary | ICD-10-CM | POA: Diagnosis not present

## 2017-04-04 DIAGNOSIS — K219 Gastro-esophageal reflux disease without esophagitis: Secondary | ICD-10-CM | POA: Diagnosis present

## 2017-04-04 DIAGNOSIS — Z806 Family history of leukemia: Secondary | ICD-10-CM | POA: Diagnosis not present

## 2017-04-04 DIAGNOSIS — R634 Abnormal weight loss: Secondary | ICD-10-CM | POA: Diagnosis present

## 2017-04-04 DIAGNOSIS — C772 Secondary and unspecified malignant neoplasm of intra-abdominal lymph nodes: Secondary | ICD-10-CM | POA: Diagnosis not present

## 2017-04-04 DIAGNOSIS — D62 Acute posthemorrhagic anemia: Secondary | ICD-10-CM | POA: Diagnosis not present

## 2017-04-04 DIAGNOSIS — E785 Hyperlipidemia, unspecified: Secondary | ICD-10-CM | POA: Diagnosis present

## 2017-04-04 HISTORY — DX: Gastro-esophageal reflux disease without esophagitis: K21.9

## 2017-04-04 HISTORY — DX: Gastrointestinal hemorrhage, unspecified: K92.2

## 2017-04-04 HISTORY — DX: Pure hypercholesterolemia, unspecified: E78.00

## 2017-04-04 HISTORY — DX: Iron deficiency anemia, unspecified: D50.9

## 2017-04-04 LAB — CBC WITH DIFFERENTIAL/PLATELET
BASOS PCT: 0 %
Basophils Absolute: 0 10*3/uL (ref 0.0–0.1)
EOS ABS: 0.1 10*3/uL (ref 0.0–0.7)
EOS PCT: 1 %
HEMATOCRIT: 31.7 % — AB (ref 36.0–46.0)
Hemoglobin: 10.6 g/dL — ABNORMAL LOW (ref 12.0–15.0)
Lymphocytes Relative: 15 %
Lymphs Abs: 0.9 10*3/uL (ref 0.7–4.0)
MCH: 30.2 pg (ref 26.0–34.0)
MCHC: 33.4 g/dL (ref 30.0–36.0)
MCV: 90.3 fL (ref 78.0–100.0)
MONO ABS: 0.6 10*3/uL (ref 0.1–1.0)
MONOS PCT: 9 %
NEUTROS ABS: 4.4 10*3/uL (ref 1.7–7.7)
Neutrophils Relative %: 75 %
PLATELETS: 209 10*3/uL (ref 150–400)
RBC: 3.51 MIL/uL — ABNORMAL LOW (ref 3.87–5.11)
RDW: 13.5 % (ref 11.5–15.5)
WBC: 6 10*3/uL (ref 4.0–10.5)

## 2017-04-04 LAB — COMPREHENSIVE METABOLIC PANEL
ALBUMIN: 3.9 g/dL (ref 3.5–5.0)
ALT: 10 U/L — ABNORMAL LOW (ref 14–54)
ANION GAP: 8 (ref 5–15)
AST: 21 U/L (ref 15–41)
Alkaline Phosphatase: 67 U/L (ref 38–126)
BILIRUBIN TOTAL: 0.8 mg/dL (ref 0.3–1.2)
BUN: 19 mg/dL (ref 6–20)
CHLORIDE: 109 mmol/L (ref 101–111)
CO2: 22 mmol/L (ref 22–32)
Calcium: 9.4 mg/dL (ref 8.9–10.3)
Creatinine, Ser: 1.24 mg/dL — ABNORMAL HIGH (ref 0.44–1.00)
GFR calc Af Amer: 49 mL/min — ABNORMAL LOW (ref >60)
GFR calc non Af Amer: 42 mL/min — ABNORMAL LOW (ref >60)
GLUCOSE: 167 mg/dL — AB (ref 65–99)
POTASSIUM: 3.2 mmol/L — AB (ref 3.5–5.1)
Sodium: 139 mmol/L (ref 135–145)
TOTAL PROTEIN: 6.3 g/dL — AB (ref 6.5–8.1)

## 2017-04-04 LAB — HEMOGLOBIN A1C
HEMOGLOBIN A1C: 4.8 % (ref 4.8–5.6)
Mean Plasma Glucose: 91.06 mg/dL

## 2017-04-04 LAB — PROTIME-INR
INR: 0.93
Prothrombin Time: 12.4 seconds (ref 11.4–15.2)

## 2017-04-04 NOTE — Progress Notes (Signed)
PCP - Dr. Heloise Purpura Cardiologist - patient denies  Chest x-ray - n/a EKG - 04/04/2017 Stress Test - patient denies ECHO - patient denies Cardiac Cath - patient denies  Sleep Study - patient denies   Patient denies shortness of breath, fever, cough and chest pain at PAT appointment   Patient verbalized understanding of instructions that were given to them at the PAT appointment. Patient was also instructed that they will need to review over the PAT instructions again at home before surgery.

## 2017-04-05 LAB — CEA: CEA: 1.4 ng/mL (ref 0.0–4.7)

## 2017-04-05 NOTE — Progress Notes (Signed)
Can one of you folks contact pt and ask her to make sure she is taking in adequate fluids (and maybe add some gatorade)?  Also recommend taking KCl 20 mEq BID.  tx FB

## 2017-04-06 MED ORDER — ACETAMINOPHEN 500 MG PO TABS
1000.0000 mg | ORAL_TABLET | ORAL | Status: AC
  Administered 2017-04-07: 1000 mg via ORAL
  Filled 2017-04-06: qty 2

## 2017-04-06 MED ORDER — GABAPENTIN 300 MG PO CAPS
300.0000 mg | ORAL_CAPSULE | ORAL | Status: AC
  Administered 2017-04-07: 300 mg via ORAL
  Filled 2017-04-06: qty 1

## 2017-04-06 MED ORDER — ALVIMOPAN 12 MG PO CAPS
12.0000 mg | ORAL_CAPSULE | ORAL | Status: AC
  Administered 2017-04-07: 12 mg via ORAL
  Filled 2017-04-06: qty 1

## 2017-04-06 MED ORDER — CEFOTETAN DISODIUM-DEXTROSE 2-2.08 GM-% IV SOLR
2.0000 g | INTRAVENOUS | Status: AC
  Administered 2017-04-07: 2 g via INTRAVENOUS
  Filled 2017-04-06: qty 50

## 2017-04-06 NOTE — H&P (Deleted)
Elizabeth Durham 03/14/2017 9:39 AM Location: [facility] Patient #: [id number] DOB: 10/17/1952 Married / Language: English / Race: White Female   History of Present Illness [name] MD; 03/14/2017 10:54 AM) The patient is a 76 year old female who presents for a follow-up for Breast cancer. Pt is a 76 yo F referred for consultation by Dr. Owens Shark for a new diagnosis of left breast cancer. She presented with screening detected left breast architectural distortion. She subsequently underwent diagnostic imaging. She was found to have an 8 mm area at 3 o'clock and a suspicious axillary lymph node. these were both biopsied and were invasive ductal carcinoma with DCIS, weakly +/-/+. She denies breast pain. She has not been able to palpate a mass. She does not have any family history of cancer of which she is aware.   She subsequently underwent a MRI which showed 5 positive lymph nodes and a 1.7 cm mass. She had neoadjuvant chemotherapy. MRI shows very good response with resolution of abnormality in the breast and significant decrease in size of the metastatic lymph nodes. She is here for treatment planning.  She has had some issues with chemotherapy. She has some tremors in her hands. She also has felt like they are extremely tight. There were some lesions in her spine that have been biopsied twice and were negative for metastatic disease despite appearing suspicious.  MRI 03/02/2017 FINDINGS: Breast composition: b. Scattered fibroglandular tissue.  Background parenchymal enhancement: Minimal.  Right breast: No mass or abnormal enhancement.  Left breast: The previously noted biopsy proven mass/cancer in the lateral left breast is not seen on the current exam. No mass or abnormal enhancement.  Lymph nodes: The previously noted biopsy proven metastatic enlarged lymph nodes in the left axilla are significantly smaller, largest measuring 1.3 x 0.5 cm on the current  exam.  Ancillary findings: None.  IMPRESSION: The previously biopsy proven cancer in the lateral left breast is not seen on current exam. No abnormal enhancement is identified in either breast.  The previously noted biopsy-proven metastatic left axillary lymph node are significantly smaller.  RECOMMENDATION: Treatment plan.  BI-RADS CATEGORY 6: Known biopsy-proven malignancy.   Allergies (April Staton, CMA; 03/14/2017 9:40 AM) No Known Drug Allergies 03/14/2017  Medication History (April Staton, CMA; 03/14/2017 9:46 AM) Acetaminophen-Codeine #3 (300-30MG  Tablet, Oral) Active. Albuterol (90MCG/ACT Aerosol Soln, Inhalation) Active. Benzonatate (200MG  Capsule, Oral) Active. Dexamethasone (4MG  Tablet, Oral) Active. Diclofenac Sodium (75MG  Tablet DR, Oral) Active. Hydrocodone-Acetaminophen (5-325MG  Tablet, Oral) Active. Lidocaine-Prilocaine (External) Specific strength unknown - Active. LORazepam (0.5MG  Tablet, Oral) Active. Magnesium Chloride (64MG  Tablet ER, Oral) Active. Singulair (10MG  Tablet, Oral) Active. Zofran ODT (8MG  Tablet Disint, Oral) Active. Potassium Chloride (20 MEQ/15ML(10%) Solution, Oral) Active. Prochlorperazine Maleate (10MG  Tablet, Oral) Active. Medications Reconciled    Review of Systems [name] MD; 03/14/2017 10:54 AM) All other systems negative  Vitals (April Staton Burdette; 03/14/2017 9:46 AM) 03/14/2017 9:46 AM Weight: 236 lb Height: 61in Body Surface Area: 2.03 m Body Mass Index: 44.59 kg/m  Temp.: 98.76F(Oral)  Pulse: 94 (Regular)  BP: 128/82 (Sitting, Left Arm, Standard)       Physical Exam [name] MD; 03/14/2017 10:55 AM) General Mental Status-Alert. General Appearance-Consistent with stated age. Hydration-Well hydrated. Voice-Normal.  Head and Neck Head-normocephalic, atraumatic with no lesions or palpable masses. Note: alopecia   Eye Sclera/Conjunctiva - Bilateral-No scleral  icterus.  Chest and Lung Exam Chest and lung exam reveals -quiet, even and easy respiratory effort with no use of accessory muscles. Inspection  Chest Wall - Normal. Back - normal.  Breast Note: no palpable mass or LAD. no erythema. no nipple retraction or skin dimpling.   Cardiovascular Cardiovascular examination reveals -normal pedal pulses bilaterally. Note: regular rate and rhythm  Abdomen Inspection-Inspection Normal. Palpation/Percussion Palpation and Percussion of the abdomen reveal - Soft, Non Tender, No Rebound tenderness, No Rigidity (guarding) and No hepatosplenomegaly.  Peripheral Vascular Upper Extremity Inspection - Bilateral - Normal - No Clubbing, No Cyanosis, No Edema, Pulses Intact. Lower Extremity Palpation - Edema - Bilateral - No edema.  Neurologic Neurologic evaluation reveals -alert and oriented x 3 with no impairment of recent or remote memory. Mental Status-Normal.  Musculoskeletal Global Assessment -Note: no gross deformities.  Normal Exam - Left-Upper Extremity Strength Normal and Lower Extremity Strength Normal. Normal Exam - Right-Upper Extremity Strength Normal and Lower Extremity Strength Normal.  Lymphatic Head & Neck  General Head & Neck Lymphatics: Bilateral - Description - Normal. Axillary  General Axillary Region: Bilateral - Description - Normal. Tenderness - Non Tender.    Assessment & Plan [name] MD; 03/14/2017 10:56 AM) MALIGNANT NEOPLASM OF UPPER-OUTER QUADRANT OF LEFT BREAST IN FEMALE, ESTROGEN RECEPTOR POSITIVE (C50.412) Impression: We had a long discussion about surgery. Because of the number of abnormal lymph nodes, we're not to proceed with an axillary lymph node dissection. We will also plan to see local lumpectomy. I discussed the risks of surgery including bleeding, infection, seroma, possible need for additional surgery, heart or lung complications, possible recurrence of cancer. Patient  understands and wished to proceed.  We discussed the potential for targeted node dissection, but given the number of abnormal nodes appearing up front, we mutually decided that the better to perform a axillary lymph node dissection rather than risk leaving behind cancer in the axilla. She is most comfortable with this plan.  I advised the patient this evening to stay overnight and would have a drain. We will schedule this at the first available opportunity.  45 min spent in evaluation, examination, counseling, and coordination of care. >50% spent in counseling. Current Plans Pt Education - [facility] breast cancer surgery: discussed with patient and provided information. Schedule for Surgery   Signed by [name], MD (03/14/2017 10:57 AM)

## 2017-04-07 ENCOUNTER — Inpatient Hospital Stay (HOSPITAL_COMMUNITY): Payer: Medicare HMO | Admitting: Anesthesiology

## 2017-04-07 ENCOUNTER — Inpatient Hospital Stay (HOSPITAL_COMMUNITY)
Admission: RE | Admit: 2017-04-07 | Discharge: 2017-04-10 | DRG: 330 | Disposition: A | Discharge status: Home / Self Care | Payer: Medicare HMO | Source: Ambulatory Visit | Attending: General Surgery | Admitting: General Surgery

## 2017-04-07 ENCOUNTER — Encounter (HOSPITAL_COMMUNITY)
Admission: RE | Disposition: A | Discharge status: Home / Self Care | Payer: Self-pay | Source: Ambulatory Visit | Attending: General Surgery

## 2017-04-07 ENCOUNTER — Encounter (HOSPITAL_COMMUNITY): Payer: Self-pay | Admitting: *Deleted

## 2017-04-07 DIAGNOSIS — E785 Hyperlipidemia, unspecified: Secondary | ICD-10-CM | POA: Diagnosis present

## 2017-04-07 DIAGNOSIS — Z87891 Personal history of nicotine dependence: Secondary | ICD-10-CM | POA: Diagnosis not present

## 2017-04-07 DIAGNOSIS — C772 Secondary and unspecified malignant neoplasm of intra-abdominal lymph nodes: Secondary | ICD-10-CM | POA: Diagnosis not present

## 2017-04-07 DIAGNOSIS — R634 Abnormal weight loss: Secondary | ICD-10-CM | POA: Diagnosis present

## 2017-04-07 DIAGNOSIS — Z806 Family history of leukemia: Secondary | ICD-10-CM | POA: Diagnosis not present

## 2017-04-07 DIAGNOSIS — E876 Hypokalemia: Secondary | ICD-10-CM | POA: Diagnosis not present

## 2017-04-07 DIAGNOSIS — D62 Acute posthemorrhagic anemia: Secondary | ICD-10-CM | POA: Diagnosis not present

## 2017-04-07 DIAGNOSIS — I10 Essential (primary) hypertension: Secondary | ICD-10-CM | POA: Diagnosis present

## 2017-04-07 DIAGNOSIS — M810 Age-related osteoporosis without current pathological fracture: Secondary | ICD-10-CM | POA: Diagnosis present

## 2017-04-07 DIAGNOSIS — K219 Gastro-esophageal reflux disease without esophagitis: Secondary | ICD-10-CM | POA: Diagnosis present

## 2017-04-07 DIAGNOSIS — Z681 Body mass index (BMI) 19 or less, adult: Secondary | ICD-10-CM

## 2017-04-07 DIAGNOSIS — C187 Malignant neoplasm of sigmoid colon: Secondary | ICD-10-CM | POA: Diagnosis present

## 2017-04-07 HISTORY — PX: LAPAROSCOPIC SIGMOID COLECTOMY: SHX5928

## 2017-04-07 LAB — CBC
HCT: 29.5 % — ABNORMAL LOW (ref 36.0–46.0)
HEMOGLOBIN: 10 g/dL — AB (ref 12.0–15.0)
MCH: 30.3 pg (ref 26.0–34.0)
MCHC: 33.9 g/dL (ref 30.0–36.0)
MCV: 89.4 fL (ref 78.0–100.0)
Platelets: 185 10*3/uL (ref 150–400)
RBC: 3.3 MIL/uL — AB (ref 3.87–5.11)
RDW: 13.2 % (ref 11.5–15.5)
WBC: 11.3 10*3/uL — ABNORMAL HIGH (ref 4.0–10.5)

## 2017-04-07 LAB — CREATININE, SERUM
Creatinine, Ser: 1.01 mg/dL — ABNORMAL HIGH (ref 0.44–1.00)
GFR calc Af Amer: >60 mL/min (ref >60)
GFR calc non Af Amer: 54 mL/min — ABNORMAL LOW (ref >60)

## 2017-04-07 SURGERY — COLECTOMY, SIGMOID, LAPAROSCOPIC
Anesthesia: General | Site: Abdomen

## 2017-04-07 MED ORDER — OXYCODONE HCL 5 MG/5ML PO SOLN: 5.0000 mg | Freq: Once | ORAL | Status: DC | PRN

## 2017-04-07 MED ORDER — SUGAMMADEX SODIUM 200 MG/2ML IV SOLN
INTRAVENOUS | Status: AC
  Filled 2017-04-07: qty 2

## 2017-04-07 MED ORDER — MORPHINE SULFATE (PF) 4 MG/ML IV SOLN: 0.5000 mg | INTRAVENOUS | Status: DC | PRN

## 2017-04-07 MED ORDER — PROPOFOL 10 MG/ML IV BOLUS
INTRAVENOUS | Status: AC
  Filled 2017-04-07: qty 20

## 2017-04-07 MED ORDER — ONDANSETRON HCL 4 MG/2ML IJ SOLN: 4.0000 mg | Freq: Once | INTRAMUSCULAR | Status: DC | PRN

## 2017-04-07 MED ORDER — GABAPENTIN 300 MG PO CAPS
300.0000 mg | ORAL_CAPSULE | Freq: Two times a day (BID) | ORAL | Status: DC
  Administered 2017-04-08 – 2017-04-10 (×5): 300 mg via ORAL
  Filled 2017-04-07 (×5): qty 1

## 2017-04-07 MED ORDER — CHLORHEXIDINE GLUCONATE CLOTH 2 % EX PADS: 6.0000 | MEDICATED_PAD | Freq: Once | CUTANEOUS | Status: DC

## 2017-04-07 MED ORDER — ACETAMINOPHEN 325 MG PO TABS: 650.0000 mg | ORAL_TABLET | Freq: Four times a day (QID) | ORAL | Status: DC | PRN

## 2017-04-07 MED ORDER — ENOXAPARIN SODIUM 40 MG/0.4ML ~~LOC~~ SOLN
40.0000 mg | SUBCUTANEOUS | Status: DC
  Administered 2017-04-08 – 2017-04-09 (×2): 40 mg via SUBCUTANEOUS
  Filled 2017-04-07 (×2): qty 0.4

## 2017-04-07 MED ORDER — ROCURONIUM BROMIDE 10 MG/ML (PF) SYRINGE
PREFILLED_SYRINGE | INTRAVENOUS | Status: AC
  Filled 2017-04-07: qty 5

## 2017-04-07 MED ORDER — PROPOFOL 10 MG/ML IV BOLUS: INTRAVENOUS | Status: DC | PRN

## 2017-04-07 MED ORDER — TRAMADOL HCL 50 MG PO TABS: 50.0000 mg | ORAL_TABLET | Freq: Four times a day (QID) | ORAL | Status: DC | PRN

## 2017-04-07 MED ORDER — LIDOCAINE HCL (PF) 1 % IJ SOLN
INTRAMUSCULAR | Status: AC
  Filled 2017-04-07: qty 30

## 2017-04-07 MED ORDER — ROCURONIUM BROMIDE 100 MG/10ML IV SOLN
INTRAVENOUS | Status: DC | PRN
  Administered 2017-04-07: 50 mg via INTRAVENOUS

## 2017-04-07 MED ORDER — ALBUMIN HUMAN 5 % IV SOLN
INTRAVENOUS | Status: DC | PRN
  Administered 2017-04-07 (×2): via INTRAVENOUS

## 2017-04-07 MED ORDER — ALVIMOPAN 12 MG PO CAPS
12.0000 mg | ORAL_CAPSULE | Freq: Two times a day (BID) | ORAL | Status: DC
  Administered 2017-04-08: 12 mg via ORAL
  Filled 2017-04-07 (×2): qty 1

## 2017-04-07 MED ORDER — SODIUM CHLORIDE 0.9% FLUSH: 9.0000 mL | INTRAVENOUS | Status: DC | PRN

## 2017-04-07 MED ORDER — DIPHENHYDRAMINE HCL 50 MG/ML IJ SOLN: 12.5000 mg | Freq: Four times a day (QID) | INTRAMUSCULAR | Status: DC | PRN

## 2017-04-07 MED ORDER — PANTOPRAZOLE SODIUM 40 MG PO TBEC
40.0000 mg | DELAYED_RELEASE_TABLET | Freq: Every day | ORAL | Status: DC
Start: 2017-04-08 — End: 2017-04-10
  Administered 2017-04-08 – 2017-04-10 (×3): 40 mg via ORAL
  Filled 2017-04-07 (×3): qty 1

## 2017-04-07 MED ORDER — OXYCODONE HCL 5 MG PO TABS: 5.0000 mg | ORAL_TABLET | Freq: Once | ORAL | Status: DC | PRN

## 2017-04-07 MED ORDER — AMLODIPINE BESYLATE 10 MG PO TABS
10.0000 mg | ORAL_TABLET | Freq: Every day | ORAL | Status: DC
  Administered 2017-04-08 – 2017-04-10 (×3): 10 mg via ORAL
  Filled 2017-04-07 (×3): qty 1

## 2017-04-07 MED ORDER — SODIUM CHLORIDE 0.9 % IR SOLN
Status: DC | PRN
  Administered 2017-04-07: 1000 mL

## 2017-04-07 MED ORDER — DEXTROSE 5 % IV SOLN
2.0000 g | Freq: Two times a day (BID) | INTRAVENOUS | Status: AC
  Administered 2017-04-07: 2 g via INTRAVENOUS
  Filled 2017-04-07: qty 2

## 2017-04-07 MED ORDER — FENTANYL CITRATE (PF) 250 MCG/5ML IJ SOLN
INTRAMUSCULAR | Status: AC
  Filled 2017-04-07: qty 5

## 2017-04-07 MED ORDER — PHENYLEPHRINE 40 MCG/ML (10ML) SYRINGE FOR IV PUSH (FOR BLOOD PRESSURE SUPPORT)
PREFILLED_SYRINGE | INTRAVENOUS | Status: AC
  Filled 2017-04-07: qty 10

## 2017-04-07 MED ORDER — MORPHINE SULFATE 2 MG/ML IV SOLN
INTRAVENOUS | Status: DC
  Administered 2017-04-07: 1 mg via INTRAVENOUS
  Filled 2017-04-07 (×2): qty 30

## 2017-04-07 MED ORDER — EPHEDRINE 5 MG/ML INJ
INTRAVENOUS | Status: AC
  Filled 2017-04-07: qty 10

## 2017-04-07 MED ORDER — BUPIVACAINE-EPINEPHRINE (PF) 0.25% -1:200000 IJ SOLN
INTRAMUSCULAR | Status: AC
  Filled 2017-04-07: qty 30

## 2017-04-07 MED ORDER — LIDOCAINE 2% (20 MG/ML) 5 ML SYRINGE
INTRAMUSCULAR | Status: AC
  Filled 2017-04-07: qty 5

## 2017-04-07 MED ORDER — ONDANSETRON 4 MG PO TBDP: 4.0000 mg | ORAL_TABLET | Freq: Four times a day (QID) | ORAL | Status: DC | PRN

## 2017-04-07 MED ORDER — SIMETHICONE 80 MG PO CHEW: 40.0000 mg | CHEWABLE_TABLET | Freq: Four times a day (QID) | ORAL | Status: DC | PRN

## 2017-04-07 MED ORDER — ONDANSETRON HCL 4 MG/2ML IJ SOLN: 4.0000 mg | Freq: Four times a day (QID) | INTRAMUSCULAR | Status: DC | PRN

## 2017-04-07 MED ORDER — PHENYLEPHRINE HCL 10 MG/ML IJ SOLN
INTRAMUSCULAR | Status: DC | PRN
  Administered 2017-04-07 (×2): 80 ug via INTRAVENOUS

## 2017-04-07 MED ORDER — FENTANYL CITRATE (PF) 100 MCG/2ML IJ SOLN
INTRAMUSCULAR | Status: DC | PRN
  Administered 2017-04-07 (×3): 25 ug via INTRAVENOUS
  Administered 2017-04-07 (×2): 50 ug via INTRAVENOUS
  Administered 2017-04-07: 25 ug via INTRAVENOUS

## 2017-04-07 MED ORDER — 0.9 % SODIUM CHLORIDE (POUR BTL) OPTIME
TOPICAL | Status: DC | PRN
  Administered 2017-04-07 (×2): 1000 mL

## 2017-04-07 MED ORDER — METHOCARBAMOL 500 MG PO TABS: 500.0000 mg | ORAL_TABLET | Freq: Four times a day (QID) | ORAL | Status: DC | PRN

## 2017-04-07 MED ORDER — ONDANSETRON HCL 4 MG/2ML IJ SOLN
INTRAMUSCULAR | Status: AC
  Filled 2017-04-07: qty 2

## 2017-04-07 MED ORDER — ONDANSETRON HCL 4 MG/2ML IJ SOLN
INTRAMUSCULAR | Status: DC | PRN
  Administered 2017-04-07: 4 mg via INTRAVENOUS

## 2017-04-07 MED ORDER — EPHEDRINE SULFATE 50 MG/ML IJ SOLN
INTRAMUSCULAR | Status: DC | PRN
  Administered 2017-04-07: 5 mg via INTRAVENOUS
  Administered 2017-04-07: 10 mg via INTRAVENOUS
  Administered 2017-04-07: 5 mg via INTRAVENOUS

## 2017-04-07 MED ORDER — PROPOFOL 10 MG/ML IV BOLUS
INTRAVENOUS | Status: DC | PRN
  Administered 2017-04-07: 150 mg via INTRAVENOUS

## 2017-04-07 MED ORDER — SUGAMMADEX SODIUM 200 MG/2ML IV SOLN
INTRAVENOUS | Status: DC | PRN
  Administered 2017-04-07: 200 mg via INTRAVENOUS

## 2017-04-07 MED ORDER — ZOLPIDEM TARTRATE 5 MG PO TABS: 5.0000 mg | ORAL_TABLET | Freq: Every evening | ORAL | Status: DC | PRN

## 2017-04-07 MED ORDER — OXYCODONE HCL 5 MG PO TABS: 5.0000 mg | ORAL_TABLET | ORAL | Status: DC | PRN

## 2017-04-07 MED ORDER — LIDOCAINE HCL 1 % IJ SOLN
INTRAMUSCULAR | Status: DC | PRN
  Administered 2017-04-07: 20 mL
  Administered 2017-04-07: 9 mL

## 2017-04-07 MED ORDER — FUROSEMIDE 20 MG PO TABS: 20.0000 mg | ORAL_TABLET | Freq: Every day | ORAL | Status: DC | PRN

## 2017-04-07 MED ORDER — DIPHENHYDRAMINE HCL 12.5 MG/5ML PO ELIX: 12.5000 mg | ORAL_SOLUTION | Freq: Four times a day (QID) | ORAL | Status: DC | PRN

## 2017-04-07 MED ORDER — LACTATED RINGERS IV SOLN
INTRAVENOUS | Status: DC
  Administered 2017-04-07 (×2): via INTRAVENOUS

## 2017-04-07 MED ORDER — LIDOCAINE HCL (CARDIAC) 20 MG/ML IV SOLN
INTRAVENOUS | Status: DC | PRN
  Administered 2017-04-07: 80 mg via INTRAVENOUS

## 2017-04-07 MED ORDER — METOPROLOL TARTRATE 5 MG/5ML IV SOLN: 5.0000 mg | Freq: Four times a day (QID) | INTRAVENOUS | Status: DC | PRN

## 2017-04-07 MED ORDER — KCL IN DEXTROSE-NACL 20-5-0.45 MEQ/L-%-% IV SOLN
INTRAVENOUS | Status: DC
  Administered 2017-04-07 – 2017-04-10 (×4): via INTRAVENOUS
  Filled 2017-04-07 (×4): qty 1000

## 2017-04-07 MED ORDER — NALOXONE HCL 0.4 MG/ML IJ SOLN: 0.4000 mg | INTRAMUSCULAR | Status: DC | PRN

## 2017-04-07 MED ORDER — FENTANYL CITRATE (PF) 100 MCG/2ML IJ SOLN: 25.0000 ug | INTRAMUSCULAR | Status: DC | PRN

## 2017-04-07 MED ORDER — ACETAMINOPHEN 650 MG RE SUPP: 650.0000 mg | Freq: Four times a day (QID) | RECTAL | Status: DC | PRN

## 2017-04-07 SURGICAL SUPPLY — 95 items
APPLIER CLIP ROT 10 11.4 M/L (STAPLE)
BLADE CLIPPER SURG (BLADE) ×3 IMPLANT
BLADE SURG 10 STRL SS (BLADE) ×3 IMPLANT
CANISTER SUCT 3000ML PPV (MISCELLANEOUS) ×3 IMPLANT
CELLS DAT CNTRL 66122 CELL SVR (MISCELLANEOUS) ×1 IMPLANT
CHLORAPREP W/TINT 26ML (MISCELLANEOUS) ×3 IMPLANT
CLIP APPLIE ROT 10 11.4 M/L (STAPLE) IMPLANT
CLOSURE STERI-STRIP 1/2X4 (GAUZE/BANDAGES/DRESSINGS) ×1
CLSR STERI-STRIP ANTIMIC 1/2X4 (GAUZE/BANDAGES/DRESSINGS) ×2 IMPLANT
COVER MAYO STAND STRL (DRAPES) ×6 IMPLANT
COVER SURGICAL LIGHT HANDLE (MISCELLANEOUS) ×6 IMPLANT
DECANTER SPIKE VIAL GLASS SM (MISCELLANEOUS) ×3 IMPLANT
DERMABOND ADVANCED (GAUZE/BANDAGES/DRESSINGS) ×2
DERMABOND ADVANCED .7 DNX12 (GAUZE/BANDAGES/DRESSINGS) ×1 IMPLANT
DRAPE HALF SHEET 40X57 (DRAPES) ×3 IMPLANT
DRAPE UTILITY XL STRL (DRAPES) ×3 IMPLANT
DRAPE WARM FLUID 44X44 (DRAPE) ×3 IMPLANT
DRSG OPSITE POSTOP 4X10 (GAUZE/BANDAGES/DRESSINGS) IMPLANT
DRSG OPSITE POSTOP 4X8 (GAUZE/BANDAGES/DRESSINGS) IMPLANT
ELECT BLADE 6.5 EXT (BLADE) IMPLANT
ELECT CAUTERY BLADE 6.4 (BLADE) ×6 IMPLANT
ELECT REM PT RETURN 9FT ADLT (ELECTROSURGICAL) ×3
ELECTRODE REM PT RTRN 9FT ADLT (ELECTROSURGICAL) ×1 IMPLANT
GEL ULTRASOUND 20GR AQUASONIC (MISCELLANEOUS) IMPLANT
GLOVE BIO SURGEON STRL SZ 6 (GLOVE) ×6 IMPLANT
GLOVE BIO SURGEON STRL SZ7 (GLOVE) ×6 IMPLANT
GLOVE BIO SURGEON STRL SZ7.5 (GLOVE) ×6 IMPLANT
GLOVE BIO SURGEON STRL SZ8 (GLOVE) ×3 IMPLANT
GLOVE BIOGEL PI IND STRL 6.5 (GLOVE) ×5 IMPLANT
GLOVE BIOGEL PI IND STRL 7.0 (GLOVE) ×5 IMPLANT
GLOVE BIOGEL PI IND STRL 7.5 (GLOVE) ×2 IMPLANT
GLOVE BIOGEL PI IND STRL 8.5 (GLOVE) ×1 IMPLANT
GLOVE BIOGEL PI INDICATOR 6.5 (GLOVE) ×10
GLOVE BIOGEL PI INDICATOR 7.0 (GLOVE) ×10
GLOVE BIOGEL PI INDICATOR 7.5 (GLOVE) ×4
GLOVE BIOGEL PI INDICATOR 8.5 (GLOVE) ×2
GLOVE ECLIPSE 6.5 STRL STRAW (GLOVE) ×12 IMPLANT
GOWN STRL REUS W/ TWL LRG LVL3 (GOWN DISPOSABLE) ×6 IMPLANT
GOWN STRL REUS W/ TWL XL LVL3 (GOWN DISPOSABLE) ×1 IMPLANT
GOWN STRL REUS W/TWL 2XL LVL3 (GOWN DISPOSABLE) ×6 IMPLANT
GOWN STRL REUS W/TWL LRG LVL3 (GOWN DISPOSABLE) ×12
GOWN STRL REUS W/TWL XL LVL3 (GOWN DISPOSABLE) ×2
KIT BASIN OR (CUSTOM PROCEDURE TRAY) ×3 IMPLANT
KIT ROOM TURNOVER OR (KITS) ×3 IMPLANT
L-HOOK LAP DISP 36CM (ELECTROSURGICAL) ×3
LEGGING LITHOTOMY PAIR STRL (DRAPES) ×3 IMPLANT
LHOOK LAP DISP 36CM (ELECTROSURGICAL) ×1 IMPLANT
LIGASURE IMPACT 36 18CM CVD LR (INSTRUMENTS) IMPLANT
LIGASURE MARYLAND LAP STAND (ELECTROSURGICAL) IMPLANT
NS IRRIG 1000ML POUR BTL (IV SOLUTION) ×6 IMPLANT
PAD ARMBOARD 7.5X6 YLW CONV (MISCELLANEOUS) ×6 IMPLANT
PENCIL BUTTON HOLSTER BLD 10FT (ELECTRODE) ×6 IMPLANT
RTRCTR WOUND ALEXIS 18CM MED (MISCELLANEOUS) ×3
SCISSORS LAP 5X35 DISP (ENDOMECHANICALS) ×3 IMPLANT
SEALER TISSUE G2 STRG ARTC 35C (ENDOMECHANICALS) ×3 IMPLANT
SET IRRIG TUBING LAPAROSCOPIC (IRRIGATION / IRRIGATOR) ×3 IMPLANT
SHEARS HARMONIC ACE PLUS 36CM (ENDOMECHANICALS) IMPLANT
SLEEVE ENDOPATH XCEL 5M (ENDOMECHANICALS) ×9 IMPLANT
SPECIMEN JAR LARGE (MISCELLANEOUS) ×3 IMPLANT
SPONGE LAP 18X18 X RAY DECT (DISPOSABLE) ×3 IMPLANT
STAPLER CIRC CVD 29MM 37CM (STAPLE) ×3 IMPLANT
STAPLER PROXIMATE 75MM BLUE (STAPLE) ×3 IMPLANT
STAPLER VISISTAT 35W (STAPLE) IMPLANT
SUCTION POOLE TIP (SUCTIONS) ×3 IMPLANT
SURGILUBE 2OZ TUBE FLIPTOP (MISCELLANEOUS) IMPLANT
SUT MNCRL AB 4-0 PS2 18 (SUTURE) ×6 IMPLANT
SUT PDS AB 1 CT  36 (SUTURE) ×4
SUT PDS AB 1 CT 36 (SUTURE) ×2 IMPLANT
SUT PDS II 0 TP-1 LOOPED 60 (SUTURE) ×6 IMPLANT
SUT PROLENE 0 SH 30 (SUTURE) ×3 IMPLANT
SUT PROLENE 2 0 CT2 30 (SUTURE) ×3 IMPLANT
SUT PROLENE 2 0 KS (SUTURE) IMPLANT
SUT VIC AB 0 CT1 27 (SUTURE) ×2
SUT VIC AB 0 CT1 27XBRD ANBCTR (SUTURE) ×1 IMPLANT
SUT VIC AB 2-0 SH 18 (SUTURE) ×3 IMPLANT
SUT VIC AB 3-0 SH 18 (SUTURE) ×3 IMPLANT
SUT VIC AB 3-0 SH 8-18 (SUTURE) ×3 IMPLANT
SUT VICRYL AB 2 0 TIES (SUTURE) ×3 IMPLANT
SUT VICRYL AB 3 0 TIES (SUTURE) ×3 IMPLANT
SYR BULB IRRIGATION 50ML (SYRINGE) ×3 IMPLANT
SYS LAPSCP GELPORT 120MM (MISCELLANEOUS)
SYSTEM LAPSCP GELPORT 120MM (MISCELLANEOUS) IMPLANT
TOWEL OR 17X26 10 PK STRL BLUE (TOWEL DISPOSABLE) ×3 IMPLANT
TRAY FOLEY W/METER SILVER 16FR (SET/KITS/TRAYS/PACK) ×3 IMPLANT
TRAY LAPAROSCOPIC MC (CUSTOM PROCEDURE TRAY) ×3 IMPLANT
TRAY PROCTOSCOPIC FIBER OPTIC (SET/KITS/TRAYS/PACK) ×3 IMPLANT
TROCAR XCEL 12X100 BLDLESS (ENDOMECHANICALS) IMPLANT
TROCAR XCEL BLUNT TIP 100MML (ENDOMECHANICALS) IMPLANT
TROCAR XCEL NON-BLD 11X100MML (ENDOMECHANICALS) IMPLANT
TROCAR XCEL NON-BLD 5MMX100MML (ENDOMECHANICALS) ×3 IMPLANT
TUBE CONNECTING 12'X1/4 (SUCTIONS) ×2
TUBE CONNECTING 12X1/4 (SUCTIONS) ×4 IMPLANT
TUBING INSUF HEATED (TUBING) ×3 IMPLANT
TUBING INSUFFLATION (TUBING) IMPLANT
YANKAUER SUCT BULB TIP NO VENT (SUCTIONS) ×6 IMPLANT

## 2017-04-07 NOTE — Anesthesia Preprocedure Evaluation (Signed)
Anesthesia Evaluation  Patient identified by MRN, date of birth, ID band Patient awake    Reviewed: Allergy & Precautions, NPO status , Patient's Chart, lab work & pertinent test results  Airway Mallampati: II  TM Distance: >3 FB     Dental  (+) Edentulous Upper, Edentulous Lower   Pulmonary    breath sounds clear to auscultation       Cardiovascular hypertension,  Rhythm:Regular Rate:Normal     Neuro/Psych    GI/Hepatic   Endo/Other    Renal/GU      Musculoskeletal   Abdominal   Peds  Hematology   Anesthesia Other Findings   Reproductive/Obstetrics                             Anesthesia Physical Anesthesia Plan  ASA: III  Anesthesia Plan: General   Post-op Pain Management:    Induction: Intravenous  PONV Risk Score and Plan: 1 and Ondansetron and Dexamethasone  Airway Management Planned: Oral ETT  Additional Equipment:   Intra-op Plan:   Post-operative Plan: Extubation in OR  Informed Consent: I have reviewed the patients History and Physical, chart, labs and discussed the procedure including the risks, benefits and alternatives for the proposed anesthesia with the patient or authorized representative who has indicated his/her understanding and acceptance.     Plan Discussed with: CRNA and Anesthesiologist  Anesthesia Plan Comments:         Anesthesia Quick Evaluation

## 2017-04-07 NOTE — Anesthesia Procedure Notes (Signed)
Procedure Name: Intubation Date/Time: 04/07/2017 1:44 PM Performed by: Scheryl Darter Pre-anesthesia Checklist: Patient identified, Emergency Drugs available, Suction available and Patient being monitored Patient Re-evaluated:Patient Re-evaluated prior to induction Oxygen Delivery Method: Circle System Utilized Preoxygenation: Pre-oxygenation with 100% oxygen Induction Type: IV induction Ventilation: Mask ventilation without difficulty Laryngoscope Size: Mac and 3 Grade View: Grade I Tube type: Oral Tube size: 7.0 mm Number of attempts: 1 Airway Equipment and Method: Stylet and Oral airway Placement Confirmation: ETT inserted through vocal cords under direct vision,  positive ETCO2 and breath sounds checked- equal and bilateral Secured at: 21 cm Tube secured with: Tape Dental Injury: Teeth and Oropharynx as per pre-operative assessment

## 2017-04-07 NOTE — Interval H&P Note (Signed)
History and Physical Interval Note:  04/07/2017 1:07 PM  Elizabeth Durham  has presented today for surgery, with the diagnosis of SIGMOID COLON CANCER  The various methods of treatment have been discussed with the patient and family. After consideration of risks, benefits and other options for treatment, the patient has consented to  Procedure(s): [place] (N/A) as a surgical intervention .  The patient's history has been reviewed, patient examined, no change in status, stable for surgery.  I have reviewed the patient's chart and labs.  Questions were answered to the patient's satisfaction.     Leif Loflin

## 2017-04-07 NOTE — Anesthesia Postprocedure Evaluation (Signed)
Anesthesia Post Note  Patient: Elizabeth Durham  Procedure(s) Performed: LAPAROSCOPIC SIGMOID COLECTOMY, ERAS PATHWAY, splenic flexure takedown (N/A Abdomen)     Patient location during evaluation: PACU Anesthesia Type: General Level of consciousness: awake, awake and alert and oriented Pain management: pain level controlled Vital Signs Assessment: post-procedure vital signs reviewed and stable Respiratory status: spontaneous breathing, nonlabored ventilation and respiratory function stable Cardiovascular status: blood pressure returned to baseline Anesthetic complications: no    Last Vitals:  Vitals:   04/07/17 1943 04/07/17 2000  BP:  (!) 93/57  Pulse:  (!) 57  Resp:  14  Temp: (!) 36.1 C 36.5 C  SpO2:  100%    Last Pain:  Vitals:   04/07/17 2000  TempSrc: Axillary  PainSc:                  [name]

## 2017-04-07 NOTE — Op Note (Signed)
Laparoscopic Sigmoid Colectomy, takedown of splenic flexure  Indications: This patient presents for a laparoscopic partial colectomy for a polyp with carcinoma  Pre-operative Diagnosis: sigmoid colon cancer  Post-operative Diagnosis: Same  Surgeon: [name]   Assistants: Sharyn Dross, MD  Anesthesia: General endotracheal anesthesia and Local anesthesia 1% plain lidocaine, 0.5% bupivacaine, with epinephrine  ASA Class: 3  Procedure Details  The patient was seen in the Holding Room. The risks, benefits, complications, treatment options, and expected outcomes were discussed with the patient. The possibilities of reaction to medication, perforation of viscus, bleeding, recurrent infection, finding a normal colon, the need for additional procedures, failure to diagnose a condition, and creating a complication requiring transfusion or operation were discussed with the patient. The patient was advised of the risk of ostomy.  The patient concurred with the proposed plan, giving informed consent.   The patient was taken to the operating room, identified, and the procedure verified as laparoscopic sigmoid colectomy. A Time Out was held and the above information confirmed.  The patient was brought to the operating room and placed into low lithotomy position. After induction of a general anesthetic, a Foley catheter was inserted and the abdomen was prepped and draped in standard fashion. The patient was then placed into reverse trendelenburg position and rotated to the right. A 5 mm Optiview trocar was placed at the left costal margin under direct visualization. Pneumoperitoneum was insufflated to a pressure of 15 mm Hg. The laparoscope was introduced.   Exploration revealed a normal omentum, small bowel, peritoneum, liver, and stomach. The tattoo was visible in the pelvis.  Two left sided  5-mm trocars were then placed after anesthetizing the skin and peritoneum with Marcaine. One left sided 5 mm  trocar was placed.    The descending colon was mobilized with gentle retraction of the colon in a medial direction with mobilization of the peritoneal reflection with cautery and the EnSeal. Mobilization of this area was complete to expose the retroperitoneum.  The left ureter was identified and traced toward the pelvis.   The left colon was able to mobilize downward.    After completing mobilization, a small lower midline incision was made with a #10 blade for the colon extraction port.  The colon was divided at the proximal rectum with a GIA 75 mm stapler.  The mesocolon was divided with the enseal.   Once the sigmoid was fully mobilized, the proximal sigmoid was divided with the cautery. The sites of division were soft and uninflamed.  The specimen was submitted to pathology.      An end-to-end anastomosis was performed with the EEA stapling device. The proximal end was opened and the sizers used to determine which EEA to use.  The 29 mm stapler was selected.  A 0-0 prolene pursestring suture was placed around the anvil.  The length of the colon was good with no tension.  The stapler was placed via the anus, the spike deployed, and the stapler coupled without difficulty.   The colon ends were pulled together with the stapler.  Pressure was held for 1 minute, the vagina was rechecked to make sure the stapler did not incorporate the posterior vaginal wall.  The stapler was fired and pressure held for another 30 seconds.  The stapler was removed.  The anastamotic rings were robust and complete.  The proctoscope was used to insufflate the rectum to test the anastamosis.  No leak was seen.  The anastamosis was at approximately 15 cm.  No bleeding  was seen.    The colon protocol was followed.  The abdomen was irrigated with antibiotic irrigation.  The peritoneum was closed with 0-0 vicryl.  The fascia was closed with running 0-0 looped PDS suture.  The skin was irrigated.  Local anesthetic was administered in  the preperitoneal space.  The skin of the ports was closed with interrupted 3-0 vicryl and 4-0 monocryl  The port sites were closed with 4-0 monocryl interrupted sutures and dermabond.  The midline wound was dressed with a honeycomb dressing.    Instrument, sponge, and needle counts were correct prior to abdominal closure and at the conclusion of the case.   Findings: inflamed distal sigmoid adhesed to itself.  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    Estimated Blood Loss: less than 50 mL             Specimens: sigmoid            Complications: None; patient tolerated the procedure well.         Disposition: PACU - hemodynamically stable.         Condition: stable

## 2017-04-07 NOTE — Transfer of Care (Signed)
Immediate Anesthesia Transfer of Care Note  Patient: Elizabeth Durham  Procedure(s) Performed: LAPAROSCOPIC SIGMOID COLECTOMY, ERAS PATHWAY, splenic flexure takedown (N/A Abdomen)  Patient Location: PACU  Anesthesia Type:General  Level of Consciousness: drowsy  Airway & Oxygen Therapy: Patient Spontanous Breathing and Patient connected to nasal cannula oxygen  Post-op Assessment: Report given to RN and Post -op Vital signs reviewed and stable  Post vital signs: Reviewed and stable  Last Vitals:  Vitals:   04/07/17 1143 04/07/17 1635  BP: 126/76 96/62  Pulse: 74 64  Resp: 19 15  Temp: 36.6 C 36.6 C  SpO2: 99% 100%    Last Pain:  Vitals:   04/07/17 1143  TempSrc: Oral      Patients Stated Pain Goal: 2 (97/91/50 [id number])  Complications: No apparent anesthesia complications

## 2017-04-07 NOTE — H&P (Signed)
Elizabeth Durham 03/30/2017 7:51 AM Location: [facility] Patient #: [id number] DOB: [birth year] Widowed / Language: Elizabeth Durham / Race: White Female   History of Present Illness [name] MD; 03/30/2017 12:44 PM) The patient is a 76 year old female who presents with colorectal cancer. Pt is a 76 yo F referred by Dr. Kathreen Devoid for new diagnosis of colon cancer. She was admitted to [facility] in July 2017 with GI bleed. She required transfusion and colonoscopy was set up, but patient was unable to tolerate prep and canceled procedure. This was not rescheduled. She had some weight loss associated with this, but has regained it. Her anemia improved as well with iron. She presented to Dr. Marian Sorrow for colonoscopy. This was performed and showed a near obstructing mass at 25 cm. This was biopsied and tattooed 5 cm distal to mass. 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 An additional polyp was biopsied at 15 cm and was a tubular adenoma with a focus of high grade dysplasia. This was completely removed. She is here to discuss surgery.   Her mother had leukemia. She denies nausea/vomiting. She periodically loses weight and regains it, but does think she has lost a little more than normal. She also feels tired. She has been constipated for years, but this is slightly worse. She has decreased GI bleeding since her colonoscopy. She has some blood when she wipes after hard bowel movements.     Past Surgical History Elizabeth Durham, [place]; 03/30/2017 12:01 PM) Colon Polyp Removal - Colonoscopy  Hip Surgery  Left.  Diagnostic Studies History Elizabeth Durham, [place]; 03/30/2017 12:01 PM) Colonoscopy  within last year  Allergies Elizabeth Durham, RMA; 03/30/2017 12:02 PM) No Known Allergies 03/30/2017  Medication History Elizabeth Durham, [place]; 03/30/2017 12:09 PM) Alendronate Sodium (70MG  Tablet, Oral) Active. Atorvastatin Calcium (10MG  Tablet,  Oral) Active. Pantoprazole Sodium (40MG  Tablet DR, Oral) Active. Ferrous Sulfate (325 (65 Fe)MG Tablet, Oral) Active. Medications Reconciled  Social History Elizabeth Durham, [place]; 03/30/2017 12:01 PM) Caffeine use  Carbonated beverages, Tea. No alcohol use  No drug use  Tobacco use  Former smoker.  Family History Elizabeth Durham, [place]; 03/30/2017 12:01 PM) Migraine Headache  Mother.  Pregnancy / Birth History Elizabeth Durham, [place]; 03/30/2017 12:01 PM) Age at menarche  61 years. Age of menopause  36-50 Gravida  3 Irregular periods  Maternal age  49-30 Para  2  Other Problems Elizabeth Durham, [place]; 03/30/2017 12:01 PM) High blood pressure  Hypercholesterolemia     Review of Systems Elizabeth Durham RMA; 03/30/2017 12:01 PM) General Not Present- Appetite Loss, Chills, Fatigue, Fever, Night Sweats, Weight Gain and Weight Loss. Skin Not Present- Change in Wart/Mole, Dryness, Hives, Jaundice, New Lesions, Non-Healing Wounds, Rash and Ulcer. HEENT Not Present- Earache, Hearing Loss, Hoarseness, Nose Bleed, Oral Ulcers, Ringing in the Ears, Seasonal Allergies, Sinus Pain, Sore Throat, Visual Disturbances, Wears glasses/contact lenses and Yellow Eyes. Respiratory Not Present- Bloody sputum, Chronic Cough, Difficulty Breathing, Snoring and Wheezing. Breast Not Present- Breast Mass, Breast Pain, Nipple Discharge and Skin Changes. Cardiovascular Not Present- Chest Pain, Difficulty Breathing Lying Down, Leg Cramps, Palpitations, Rapid Heart Rate, Shortness of Breath and Swelling of Extremities. Gastrointestinal Not Present- Abdominal Pain, Bloating, Bloody Stool, Change in Bowel Habits, Chronic diarrhea, Constipation, Difficulty Swallowing, Excessive gas, Gets full quickly at meals, Hemorrhoids, Indigestion, Nausea, Rectal Pain and Vomiting. Female Genitourinary Not Present- Frequency, Nocturia, Painful Urination, Pelvic Pain and Urgency. Musculoskeletal Not Present- Back  Pain, Joint Pain, Joint Stiffness, Muscle  Pain, Muscle Weakness and Swelling of Extremities. Neurological Not Present- Decreased Memory, Fainting, Headaches, Numbness, Seizures, Tingling, Tremor, Trouble walking and Weakness. Psychiatric Not Present- Anxiety, Bipolar, Change in Sleep Pattern, Depression, Fearful and Frequent crying. Endocrine Not Present- Cold Intolerance, Excessive Hunger, Hair Changes, Heat Intolerance, Hot flashes and New Diabetes.  Vitals Elizabeth Durham RMA; 03/30/2017 12:09 PM) 03/30/2017 12:09 PM Weight: 104.8 lb Height: 64in Body Surface Area: 1.49 m Body Mass Index: 17.99 kg/m  Temp.: 98.65F  Pulse: 68 (Regular)  BP: 120/70 (Sitting, Left Arm, Standard)       Physical Exam [name] MD; 03/30/2017 8:03 AM) General Mental Status-Alert. General Appearance-Consistent with stated age. Hydration-Well hydrated. Voice-Normal.  Head and Neck Head-normocephalic, atraumatic with no lesions or palpable masses. Trachea-midline. Thyroid Gland Characteristics - normal size and consistency.  Eye Eyeball - Bilateral-Extraocular movements intact. Sclera/Conjunctiva - Bilateral-No scleral icterus.  Chest and Lung Exam Chest and lung exam reveals -quiet, even and easy respiratory effort with no use of accessory muscles and on auscultation, normal breath sounds, no adventitious sounds and normal vocal resonance. Inspection Chest Wall - Normal. Back - normal.  Cardiovascular Cardiovascular examination reveals -normal heart sounds, regular rate and rhythm with no murmurs and normal pedal pulses bilaterally.  Abdomen Inspection Inspection of the abdomen reveals - No Hernias. Palpation/Percussion Palpation and Percussion of the abdomen reveal - Soft, Non Tender, No Rebound tenderness, No Rigidity (guarding) and No hepatosplenomegaly. Auscultation Auscultation of the abdomen reveals - Bowel sounds normal.  Neurologic Neurologic  evaluation reveals -alert and oriented x 3 with no impairment of recent or remote memory. Mental Status-Normal.  Musculoskeletal Global Assessment -Note: no gross deformities.  Normal Exam - Left-Upper Extremity Strength Normal and Lower Extremity Strength Normal. Normal Exam - Right-Upper Extremity Strength Normal and Lower Extremity Strength Normal.  Lymphatic Head & Neck  General Head & Neck Lymphatics: Bilateral - Description - Normal. Axillary  General Axillary Region: Bilateral - Description - Normal. Tenderness - Non Tender. Femoral & Inguinal  Generalized Femoral & Inguinal Lymphatics: Bilateral - Description - No Generalized lymphadenopathy.    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 I discussed this with her and her daughter.  I will get staging scans. I reviewed surgery wtih diagrams of anatomy. She was given a colon surgery book.  I discussed recovery time, time in the hospital, and risks. I also reviewed post op follow up. I discussed risks of bleeding, infection, damage to adjacent structures, blood clot, heart or lung complications, wound issues, anastamotic leak, and risk of death.  I discussed that she may need additional treatment following surgery.  She understands and wishes to proceed. She has a girls trip planned 12/11 to [place] at [facility]. Current Plans Pt Education - flb sigmoid colon: discussed with patient and provided information. You are being scheduled for surgery- Our schedulers will call you.  You should hear from our office's scheduling department within 5 working days about the location, date, and time of surgery. We try to make accommodations for patient's preferences in scheduling surgery, but sometimes the OR schedule or the surgeon's schedule prevents [place] from making those accommodations.  If you have not  heard from our office [phone]) in 5 working days, call the office and ask for your surgeon's nurse.  If you have other questions about your diagnosis, plan, or surgery, call the office and ask for your surgeon's nurse.  CT ABDOMEN  AND PELVIS W CONTRAST [phone]) (staging from new dx colon cancer) CT CHEST W CON (21224) (staging new dx colon cancer) Pt Education - [facility] Colon Bowel Prep 2018 ERAS/Miralax/Antibiotics Started Neomycin Sulfate 500MG , 2 (two) Tablet SEE NOTE, #6, 03/30/2017, No Refill. Local Order: TAKE TWO TABLETS AT 2 PM, 3 PM, AND 10 PM THE DAY PRIOR TO SURGERY Started Flagyl 500MG , 2 (two) Tablet SEE NOTE, #6, 03/30/2017, No Refill. Local Order: Take at 2pm, 3pm, and 10pm the day prior to your colon operation   Signed by [name], MD (03/30/2017 12:49 PM)

## 2017-04-08 ENCOUNTER — Encounter (HOSPITAL_COMMUNITY): Payer: Self-pay | Admitting: General Surgery

## 2017-04-08 LAB — CBC
HCT: 27.7 % — ABNORMAL LOW (ref 36.0–46.0)
Hemoglobin: 9.3 g/dL — ABNORMAL LOW (ref 12.0–15.0)
MCH: 30 pg (ref 26.0–34.0)
MCHC: 33.6 g/dL (ref 30.0–36.0)
MCV: 89.4 fL (ref 78.0–100.0)
PLATELETS: 207 10*3/uL (ref 150–400)
RBC: 3.1 MIL/uL — AB (ref 3.87–5.11)
RDW: 13.4 % (ref 11.5–15.5)
WBC: 6.7 10*3/uL (ref 4.0–10.5)

## 2017-04-08 LAB — BASIC METABOLIC PANEL
ANION GAP: 5 (ref 5–15)
BUN: 5 mg/dL — AB (ref 6–20)
CALCIUM: 8.1 mg/dL — AB (ref 8.9–10.3)
CO2: 25 mmol/L (ref 22–32)
Chloride: 104 mmol/L (ref 101–111)
Creatinine, Ser: 0.93 mg/dL (ref 0.44–1.00)
GFR calc Af Amer: >60 mL/min (ref >60)
GFR calc non Af Amer: 60 mL/min — ABNORMAL LOW (ref >60)
GLUCOSE: 134 mg/dL — AB (ref 65–99)
POTASSIUM: 3.1 mmol/L — AB (ref 3.5–5.1)
SODIUM: 134 mmol/L — AB (ref 135–145)

## 2017-04-08 MED ORDER — POTASSIUM CHLORIDE 10 MEQ/100ML IV SOLN
10.0000 meq | INTRAVENOUS | Status: AC
  Administered 2017-04-08 (×6): 10 meq via INTRAVENOUS
  Filled 2017-04-08 (×6): qty 100

## 2017-04-08 NOTE — Progress Notes (Signed)
1 Day Post-Op   Subjective/Chief Complaint: Denies n/v.  Says "sore, but not really hurting."  No flatus yet.  Did not get to floor until 8 pm.    Objective: Vital signs in last 24 hours: Temp:  [97 F (36.1 C)-98.5 F (36.9 C)] 98.5 F (36.9 C) (10/05 0445) Pulse Rate:  [50-74] 61 (10/05 0445) Resp:  [10-21] 16 (10/05 0748) BP: (93-126)/(57-76) 105/59 (10/05 0445) SpO2:  [96 %-100 %] 98 % (10/05 0748) Weight:  [47.2 kg (104 lb)-48.8 kg (107 lb 8 oz)] 48.8 kg (107 lb 8 oz) (10/04 2000) Last BM Date: 04/07/17 (prior to admission)  Intake/Output from previous day: 10/04 0701 - 10/05 0700 In: 1923.8 [I.V.:1673.8; IV Piggyback:250] Out: 1750 [Urine:1650; Blood:100] Intake/Output this shift: No intake/output data recorded.  General appearance: alert, cooperative and no distress Resp: breathing comfortably Cardio: regular rate and rhythm GI: soft, non distended.  Dressing c/d/i.  approp tender at incision. Extremities: extremities normal, atraumatic, no cyanosis or edema  Lab Results:   Recent Labs  04/07/17 2016 04/08/17 0435  WBC 11.3* 6.7  HGB 10.0* 9.3*  HCT 29.5* 27.7*  PLT 185 207   BMET  Recent Labs  04/07/17 2016 04/08/17 0435  NA  --  134*  K  --  3.1*  CL  --  104  CO2  --  25  GLUCOSE  --  134*  BUN  --  5*  CREATININE 1.01* 0.93  CALCIUM  --  8.1*   PT/INR No results for input(s): LABPROT, INR in the last 72 hours. ABG No results for input(s): PHART, HCO3 in the last 72 hours.  Invalid input(s): PCO2, PO2  Studies/Results: No results found.  Anti-infectives: Anti-infectives    Start     Dose/Rate Route Frequency Ordered Stop   04/07/17 2200  cefoTEtan (CEFOTAN) 2 g in dextrose 5 % 50 mL IVPB     2 g 100 mL/hr over 30 Minutes Intravenous Every 12 hours 04/07/17 2002 04/07/17 2127   04/07/17 1330  cefoTEtan in Dextrose 5% (CEFOTAN) IVPB 2 g     2 g Intravenous To ShortStay Surgical 04/06/17 1248 04/07/17 1351      3 Assessment/Plan: s/p Procedure(s): LAPAROSCOPIC SIGMOID COLECTOMY, ERAS PATHWAY, splenic flexure takedown (N/A) ambulate today.  Leave foley until tomorrow since pt has not ambulated yet. PCA/prn meds for pain control.  Not using PCA much, may be able to d/c tomorrow. Entereg, await return of bowel function. Antihypertensives.  Anemia - chronic blood loss anemia + acute blood loss anemia.  Will follow. Hyperkalemia - repleting today.      LOS: 1 day    [facility] 04/08/2017

## 2017-04-09 LAB — BASIC METABOLIC PANEL
Anion gap: 6 (ref 5–15)
CALCIUM: 8.5 mg/dL — AB (ref 8.9–10.3)
CHLORIDE: 105 mmol/L (ref 101–111)
CO2: 25 mmol/L (ref 22–32)
Creatinine, Ser: 0.9 mg/dL (ref 0.44–1.00)
GFR calc Af Amer: >60 mL/min (ref >60)
GFR calc non Af Amer: >60 mL/min (ref >60)
GLUCOSE: 116 mg/dL — AB (ref 65–99)
POTASSIUM: 4 mmol/L (ref 3.5–5.1)
Sodium: 136 mmol/L (ref 135–145)

## 2017-04-09 LAB — CBC
HEMATOCRIT: 30.1 % — AB (ref 36.0–46.0)
HEMOGLOBIN: 9.9 g/dL — AB (ref 12.0–15.0)
MCH: 30.2 pg (ref 26.0–34.0)
MCHC: 32.9 g/dL (ref 30.0–36.0)
MCV: 91.8 fL (ref 78.0–100.0)
Platelets: 207 10*3/uL (ref 150–400)
RBC: 3.28 MIL/uL — AB (ref 3.87–5.11)
RDW: 14 % (ref 11.5–15.5)
WBC: 4.5 10*3/uL (ref 4.0–10.5)

## 2017-04-09 MED ORDER — OXYCODONE HCL 5 MG PO TABS: 5.0000 mg | ORAL_TABLET | ORAL | Status: DC | PRN

## 2017-04-09 MED ORDER — MORPHINE SULFATE (PF) 4 MG/ML IV SOLN: 0.5000 mg | INTRAVENOUS | Status: DC | PRN

## 2017-04-09 NOTE — Progress Notes (Signed)
Pt Morphine PCA discontinued wasted with witness Corky Crafts, charge nurse aware Hassan Rowan.

## 2017-04-09 NOTE — Progress Notes (Signed)
Patient ID: Elizabeth Durham, female   DOB: 1941/07/01, 76 y.o.   MRN: [id number]  [facility] Surgery Progress Note  2 Days Post-Op  Subjective: CC- sore Patient states that she has no abdominal pain, just a little sore. Ambulated yesterday. Passing flatus and has had several BMs in the last 24 hours. Tolerating clear liquids. Denies n/v.  Objective: Vital signs in last 24 hours: Temp:  [98.1 F (36.7 C)-98.6 F (37 C)] 98.1 F (36.7 C) (10/06 0522) Pulse Rate:  [66-88] 66 (10/06 0522) Resp:  [16-20] 18 (10/06 0522) BP: (106-120)/(62-74) 119/74 (10/06 0522) SpO2:  [94 %-98 %] 98 % (10/06 0522) Last BM Date: 04/08/17  Intake/Output from previous day: 10/05 0701 - 10/06 0700 In: 2230 [P.O.:720; I.V.:910; IV Piggyback:600] Out: 1657 [Urine:1651; Stool:6] Intake/Output this shift: Total I/O In: -  Out: 200 [Urine:200]  PE: Gen:  Alert, NAD, pleasant HEENT: EOM's intact, pupils equal and round Pulm:  CTAB, no W/R/R, effort normal Abd: Soft, ND, +BS, midline incision with clean/dry dressing, appropriately tender around incision Ext:  No erythema, edema, or tenderness BUE/BLE  Psych: A&Ox3  Skin: no rashes noted, warm and dry  Lab Results:   Recent Labs  04/08/17 0435 04/09/17 0458  WBC 6.7 4.5  HGB 9.3* 9.9*  HCT 27.7* 30.1*  PLT 207 207   BMET  Recent Labs  04/08/17 0435 04/09/17 0458  NA 134* 136  K 3.1* 4.0  CL 104 105  CO2 25 25  GLUCOSE 134* 116*  BUN 5* <5*  CREATININE 0.93 0.90  CALCIUM 8.1* 8.5*   PT/INR No results for input(s): LABPROT, INR in the last 72 hours. CMP     Component Value Date/Time   NA 136 04/09/2017 0458   K 4.0 04/09/2017 0458   CL 105 04/09/2017 0458   CO2 25 04/09/2017 0458   GLUCOSE 116 (H) 04/09/2017 0458   BUN <5 (L) 04/09/2017 0458   CREATININE 0.90 04/09/2017 0458   CALCIUM 8.5 (L) 04/09/2017 0458   PROT 6.3 (L) 04/04/2017 1606   ALBUMIN 3.9 04/04/2017 1606   AST 21 04/04/2017 1606   ALT 10 (L) 04/04/2017  1606   ALKPHOS 67 04/04/2017 1606   BILITOT 0.8 04/04/2017 1606   GFRNONAA >60 04/09/2017 0458   GFRAA >60 04/09/2017 0458   Lipase  No results found for: LIPASE     Studies/Results: No results found.  Anti-infectives: Anti-infectives    Start     Dose/Rate Route Frequency Ordered Stop   04/07/17 2200  cefoTEtan (CEFOTAN) 2 g in dextrose 5 % 50 mL IVPB     2 g 100 mL/hr over 30 Minutes Intravenous Every 12 hours 04/07/17 2002 04/07/17 2127   04/07/17 1330  cefoTEtan in Dextrose 5% (CEFOTAN) IVPB 2 g     2 g Intravenous To ShortStay Surgical 04/06/17 1248 04/07/17 1351       Assessment/Plan HTN HLD GERD Osteoporosis Anemia - Hg 9.9, stable  Sigmoid colon cancer S/p laparoscopic sigmoid colectomy, takedown of splenic flexure 10/4 Dr. Barry Dienes - POD 2 - surgical path pending - WBC 4.5, afebrile - passing flatus and having BMs  ID - cefotetan perioperative FEN - decrease IVF, FLD VTE - SCDs, lovenox Foley - out Follow up - Dr. Barry Dienes  Plan - Advance to full liquids. Continue ambulating. D/c PCA. Labs in AM.   LOS: 2 days    [name] , [facility] 04/09/2017, 7:49 AM Pager: [phone] Consults: [phone] Mon-Fri 7:00 am-4:30 pm  Sat-Sun 7:00 am-11:30 am

## 2017-04-10 LAB — CBC
HEMATOCRIT: 30.6 % — AB (ref 36.0–46.0)
HEMOGLOBIN: 10.3 g/dL — AB (ref 12.0–15.0)
MCH: 30.8 pg (ref 26.0–34.0)
MCHC: 33.7 g/dL (ref 30.0–36.0)
MCV: 91.6 fL (ref 78.0–100.0)
Platelets: 183 10*3/uL (ref 150–400)
RBC: 3.34 MIL/uL — AB (ref 3.87–5.11)
RDW: 13.7 % (ref 11.5–15.5)
WBC: 3.9 10*3/uL — AB (ref 4.0–10.5)

## 2017-04-10 LAB — BASIC METABOLIC PANEL
Anion gap: 7 (ref 5–15)
CHLORIDE: 105 mmol/L (ref 101–111)
CO2: 26 mmol/L (ref 22–32)
Calcium: 8.7 mg/dL — ABNORMAL LOW (ref 8.9–10.3)
Creatinine, Ser: 0.82 mg/dL (ref 0.44–1.00)
GFR calc Af Amer: >60 mL/min (ref >60)
GFR calc non Af Amer: >60 mL/min (ref >60)
Glucose, Bld: 101 mg/dL — ABNORMAL HIGH (ref 65–99)
POTASSIUM: 3.7 mmol/L (ref 3.5–5.1)
Sodium: 138 mmol/L (ref 135–145)

## 2017-04-10 MED ORDER — GABAPENTIN 300 MG PO CAPS: 300.0000 mg | ORAL_CAPSULE | Freq: Two times a day (BID) | ORAL | 0 refills | Status: DC

## 2017-04-10 MED ORDER — TRAMADOL HCL 50 MG PO TABS: 50.0000 mg | ORAL_TABLET | Freq: Four times a day (QID) | ORAL | 0 refills | Status: DC | PRN

## 2017-04-10 NOTE — Progress Notes (Signed)
Discharge paperwork given to patient. Prescription given. Patient is ready for discharge and is awaiting a ride.

## 2017-04-10 NOTE — Progress Notes (Signed)
Patient ID: Elizabeth Durham, female   DOB: 1941/02/24, 76 y.o.   MRN: [id number]  [facility] Surgery Progress Note  3 Days Post-Op  Subjective: CC-  Patient states that she feels great. Denies abdominal pain. Not using any pain medications. She is passing flatus and had multiple BMs yesterday. Tolerating full liquids. Wants to go home.  Objective: Vital signs in last 24 hours: Temp:  [98.2 F (36.8 C)-98.5 F (36.9 C)] 98.2 F (36.8 C) (10/07 0440) Pulse Rate:  [61-99] 99 (10/07 0440) Resp:  [18] 18 (10/07 0440) BP: (120-123)/(60-74) 120/74 (10/07 0440) SpO2:  [98 %] 98 % (10/07 0440) Last BM Date: 04/09/17  Intake/Output from previous day: 10/06 0701 - 10/07 0700 In: 7902 [P.O.:820; I.V.:759] Out: 200 [Urine:200] Intake/Output this shift: No intake/output data recorded.  PE: Gen:  Alert, NAD, pleasant HEENT: EOM's intact, pupils equal and round Pulm:  CTAB, no W/R/R, effort normal Abd: Soft, ND, +BS, midline incision cdi with steris intact, appropriately tender around incision Ext:  No erythema, edema, or tenderness BUE/BLE  Psych: A&Ox3  Skin: no rashes noted, warm and dry   Lab Results:   Recent Labs  04/09/17 0458 04/10/17 0422  WBC 4.5 3.9*  HGB 9.9* 10.3*  HCT 30.1* 30.6*  PLT 207 183   BMET  Recent Labs  04/09/17 0458 04/10/17 0422  NA 136 138  K 4.0 3.7  CL 105 105  CO2 25 26  GLUCOSE 116* 101*  BUN <5* <5*  CREATININE 0.90 0.82  CALCIUM 8.5* 8.7*   PT/INR No results for input(s): LABPROT, INR in the last 72 hours. CMP     Component Value Date/Time   NA 138 04/10/2017 0422   K 3.7 04/10/2017 0422   CL 105 04/10/2017 0422   CO2 26 04/10/2017 0422   GLUCOSE 101 (H) 04/10/2017 0422   BUN <5 (L) 04/10/2017 0422   CREATININE 0.82 04/10/2017 0422   CALCIUM 8.7 (L) 04/10/2017 0422   PROT 6.3 (L) 04/04/2017 1606   ALBUMIN 3.9 04/04/2017 1606   AST 21 04/04/2017 1606   ALT 10 (L) 04/04/2017 1606   ALKPHOS 67 04/04/2017 1606   BILITOT  0.8 04/04/2017 1606   GFRNONAA >60 04/10/2017 0422   GFRAA >60 04/10/2017 0422   Lipase  No results found for: LIPASE     Studies/Results: No results found.  Anti-infectives: Anti-infectives    Start     Dose/Rate Route Frequency Ordered Stop   04/07/17 2200  cefoTEtan (CEFOTAN) 2 g in dextrose 5 % 50 mL IVPB     2 g 100 mL/hr over 30 Minutes Intravenous Every 12 hours 04/07/17 2002 04/07/17 2127   04/07/17 1330  cefoTEtan in Dextrose 5% (CEFOTAN) IVPB 2 g     2 g Intravenous To ShortStay Surgical 04/06/17 1248 04/07/17 1351       Assessment/Plan HTN HLD GERD Osteoporosis Anemia - Hg 10.3, stable  Sigmoid colon cancer S/p laparoscopic sigmoid colectomy, takedown of splenic flexure 10/4 Dr. Barry Dienes - POD 3 - surgical path pending - WBC 3.9, afebrile - tolerating fulls, bowel functioning  ID - cefotetan perioperative FEN - decrease IVF, soft diet VTE - SCDs, lovenox Foley - out Follow up - Dr. Barry Dienes  Plan - advance to soft diet. If patient tolerates this she may be discharged later today. She already has follow up scheduled with Dr. Barry Dienes in about 3 weeks.    LOS: 3 days    [name] , [facility] Surgery 04/10/2017,  7:49 AM Pager: [phone] Consults: [phone] Mon-Fri 7:00 am-4:30 pm Sat-Sun 7:00 am-11:30 am

## 2017-04-10 NOTE — Discharge Instructions (Signed)
[facility]      [facility] [phone]  OPEN ABDOMINAL SURGERY: POST OP INSTRUCTIONS  Always review your discharge instruction sheet given to you by the facility where your surgery was performed.  IF YOU HAVE DISABILITY OR FAMILY LEAVE FORMS, YOU MUST BRING THEM TO THE OFFICE FOR PROCESSING.  PLEASE DO NOT GIVE THEM TO YOUR DOCTOR.  1. A prescription for pain medication may be given to you upon discharge.  Take your pain medication as prescribed, if needed.  If narcotic pain medicine is not needed, then you may take acetaminophen (Tylenol) or ibuprofen (Advil) as needed. 2. Take your usually prescribed medications unless otherwise directed. 3. If you need a refill on your pain medication, please contact your pharmacy. They will contact our office to request authorization.  Prescriptions will not be filled after 5pm or on week-ends. 4. You should follow a light diet the first few days after arrival home, such as soup and crackers, pudding, etc.unless your doctor has advised otherwise. A high-fiber, low fat diet can be resumed as tolerated.   Be sure to include lots of fluids daily. Most patients will experience some swelling and bruising on the chest and neck area.  Ice packs will help.  Swelling and bruising can take several days to resolve 5. Most patients will experience some swelling and bruising in the area of the incision. Ice pack will help. Swelling and bruising can take several days to resolve..  6. It is common to experience some constipation if taking pain medication after surgery.  Increasing fluid intake and taking a stool softener will usually help or prevent this problem from occurring.  A mild laxative (Milk of Magnesia or Miralax) should be taken according to package directions if there are no bowel movements after 48 hours. 7.  You may have steri-strips (small skin tapes) in place directly over the incision.  These strips should be left on the skin for 7-10 days.  If your  surgeon used skin glue on the incision, you may shower in 24 hours.  The glue will flake off over the next 2-3 weeks.  Any sutures or staples will be removed at the office during your follow-up visit. You may find that a light gauze bandage over your incision may keep your staples from being rubbed or pulled. You may shower and replace the bandage daily. 8. ACTIVITIES:  You may resume regular (light) daily activities beginning the next day--such as daily self-care, walking, climbing stairs--gradually increasing activities as tolerated.  You may have sexual intercourse when it is comfortable.  Refrain from any heavy lifting or straining until approved by your doctor. a. You may drive when you no longer are taking prescription pain medication, you can comfortably wear a seatbelt, and you can safely maneuver your car and apply brakes b. Return to Work: ___________________________________ 9. You should see your doctor in the office for a follow-up appointment approximately two weeks after your surgery.  Make sure that you call for this appointment within a day or two after you arrive home to insure a convenient appointment time. OTHER INSTRUCTIONS:  _____________________________________________________________ _____________________________________________________________  WHEN TO CALL YOUR DOCTOR: 1. Fever over 101.0 2. Inability to urinate 3. Nausea and/or vomiting 4. Extreme swelling or bruising 5. Continued bleeding from incision. 6. Increased pain, redness, or drainage from the incision. 7. Difficulty swallowing or breathing 8. Muscle cramping or spasms. 9. Numbness or tingling in hands or feet or around lips.  The clinic staff is available to   answer your questions during regular business hours.  Please don't hesitate to call and ask to speak to one of the nurses if you have concerns.  For further questions, please visit [email or web address]   

## 2017-04-10 NOTE — Care Management Note (Signed)
Case Management Note  Patient Details  Name: KENTLEY CEDILLO MRN: [id number] Date of Birth: March 05, 1941  Subjective/Objective:    76 y/o s/p lap sig colectomy, advanced to soft diet today and if tolerated will d/c home with no CM needs today.                 Action/Plan:CM will sign off for now but will be available should additional discharge needs arise or disposition change.    Expected Discharge Date:  04/10/17               Expected Discharge Plan:  Home/Self Care  In-House Referral:  NA  Discharge planning Services  CM Consult  Post Acute Care Choice:  NA Choice offered to:     DME Arranged:    DME Agency:     HH Arranged:    HH Agency:     Status of Service:  Completed, signed off  If discussed at [employer] of Stay Meetings, dates discussed:    Additional Comments:  Delrae Sawyers, RN 04/10/2017, 9:08 AM

## 2017-04-12 NOTE — Consult Note (Signed)
           [facility] CM Primary Care Navigator  04/12/2017  EMALI HEYWARD 09-Dec-1940 [id number]   Attemptto seepatient at the bedsideto identify possible discharge needs but she was alreadydischargedper staff report.  Patient was discharged home over the weekend.  Primary care provider's officeis listed as doing transition of care (TOC).   For questions, please contact:  Dannielle Huh, BSN, RN- [facility] Primary Care Navigator  Telephone: [phone] [place]

## 2017-04-13 ENCOUNTER — Telehealth: Payer: Self-pay | Admitting: General Surgery

## 2017-04-13 NOTE — Discharge Summary (Signed)
[facility] Discharge Summary   Patient ID: Elizabeth Durham MRN: [id number] DOB/AGE: Jul 27, 1940 76 y.o.  Admit date: 04/07/2017 Discharge date: 04/10/2017  Admitting Diagnosis: Colorectal cancer  Discharge Diagnosis Patient Active Problem List   Diagnosis Date Noted  . Cancer of sigmoid colon ([place]) 04/07/2017  . Essential hypertension 01/06/2016  . GI bleed 01/06/2016  . Acute blood loss anemia 01/06/2016    Consultants None  Imaging: No results found.  Procedures Dr. Barry Dienes (04/07/17) - Laparoscopic sigmoid colectomy, takedown of splenic flexure  Hospital Course:  Elizabeth Durham is a 76yo female PMH colon cancer diagnosed after colonoscopy by Dr. 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 She was referred to Dr. Marlowe Aschoff office and was admitted to [facility] 10/4 for laparoscopic sigmoid colectomy. Tolerated procedure well and was transferred to the floor.  Diet was advanced as tolerated.  On POD3 the patient was voiding well, tolerating diet, ambulating well, pain well controlled, vital signs stable, incisions c/d/i and felt stable for discharge home.  Patient will follow up with Dr. Barry Dienes in 3 weeks and knows to call with questions or concerns.   Physical Exam: Gen: Alert, NAD, pleasant HEENT: EOM's intact, pupils equal and round Pulm: CTAB, no W/R/R, effort normal Abd: Soft, ND, +BS, midlineincision cdi with steris intact, appropriately tender around incision Ext: No erythema, edema, or tenderness BUE/BLE  Psych: A&Ox3  Skin: no rashes noted, warm and dry    Allergies as of 04/10/2017   No Known Allergies     Medication List    STOP taking these medications   neomycin 500 MG tablet Commonly known as:  MYCIFRADIN     TAKE these medications   alendronate 70 MG tablet Commonly known as:  FOSAMAX Take 70 mg by mouth every 7 (seven) days.   amLODipine 10 MG tablet Commonly known as:   NORVASC Take 10 mg by mouth daily. for blood pressure   atorvastatin 10 MG tablet Commonly known as:  LIPITOR Take 10 mg by mouth daily.   ferrous sulfate 325 (65 FE) MG tablet Take 325 mg by mouth daily.   furosemide 20 MG tablet Commonly known as:  LASIX Take 20 mg by mouth daily as needed for edema.   gabapentin 300 MG capsule Commonly known as:  NEURONTIN Take 1 capsule (300 mg total) by mouth 2 (two) times daily.   pantoprazole 40 MG tablet Commonly known as:  PROTONIX Take 1 tablet (40 mg total) by mouth daily.   promethazine 12.5 MG tablet Commonly known as:  PHENERGAN Take 1 tablet (12.5 mg total) by mouth every 6 (six) hours as needed for nausea or vomiting.   traMADol 50 MG tablet Commonly known as:  ULTRAM Take 1 tablet (50 mg total) by mouth every 6 (six) hours as needed (mild pain).        Follow-up Information    [name], MD. Call in 3 week(s).   Specialty:  General Surgery Contact information: [place] [address] [phone]           Signed: [name], [facility] Surgery 04/13/2017, 1:46 PM Pager: [phone] Consults: [phone] Mon-Fri 7:00 am-4:30 pm Sat-Sun 7:00 am-11:30 am

## 2017-04-13 NOTE — Telephone Encounter (Signed)
Discussed pathology with patient.  Will place referral for medical oncology.

## 2017-04-13 NOTE — Progress Notes (Signed)
Please make referral to oncology for new colon cancer.

## 2017-04-19 ENCOUNTER — Ambulatory Visit
Admission: RE | Admit: 2017-04-19 | Discharge: 2017-04-19 | Disposition: A | Discharge status: Home / Self Care | Payer: Medicare HMO | Source: Ambulatory Visit | Attending: General Surgery | Admitting: General Surgery

## 2017-04-19 DIAGNOSIS — C187 Malignant neoplasm of sigmoid colon: Secondary | ICD-10-CM

## 2017-04-19 MED ORDER — IOPAMIDOL (ISOVUE-300) INJECTION 61%
100.0000 mL | Freq: Once | INTRAVENOUS | Status: AC | PRN
  Administered 2017-04-19: 100 mL via INTRAVENOUS

## 2017-04-19 NOTE — Progress Notes (Signed)
[facility] received referral on 04/18/17 to set patient up for initial med/onc appointment. I called patient to introduce myself and to let patient know that our New Patient Scheduler would be calling patient with an appointment time and date. Patient states that she prepping for her CT scans today and has been in the bathroom and "not feeling so good".  Patient encouraged to call with any questions or concerns.

## 2017-04-21 ENCOUNTER — Encounter: Payer: Self-pay | Admitting: Hematology

## 2017-04-21 ENCOUNTER — Telehealth: Payer: Self-pay | Admitting: Hematology

## 2017-04-21 NOTE — Telephone Encounter (Signed)
Pt has been scheduled for the pt to see Dr. Burr Medico on 10/24 at 230pm. Pt aware to arrive 30 minutes early. Letter mailed to the pt.

## 2017-04-25 ENCOUNTER — Encounter (HOSPITAL_COMMUNITY): Payer: Self-pay | Admitting: General Surgery

## 2017-04-25 NOTE — Addendum Note (Signed)
Addendum  created 04/25/17 1548 by Roberts Gaudy, MD   Anesthesia Event edited, Anesthesia Staff edited

## 2017-04-26 ENCOUNTER — Ambulatory Visit: Payer: Medicare HMO | Admitting: Hematology

## 2017-04-27 ENCOUNTER — Telehealth: Payer: Self-pay

## 2017-04-27 ENCOUNTER — Encounter: Payer: Self-pay | Admitting: Hematology

## 2017-04-27 ENCOUNTER — Ambulatory Visit (HOSPITAL_BASED_OUTPATIENT_CLINIC_OR_DEPARTMENT_OTHER): Payer: Medicare HMO | Admitting: Hematology

## 2017-04-27 ENCOUNTER — Ambulatory Visit (HOSPITAL_BASED_OUTPATIENT_CLINIC_OR_DEPARTMENT_OTHER): Payer: Medicare HMO

## 2017-04-27 VITALS — BP 128/72 | HR 71 | Temp 97.9°F | Resp 18 | Ht 64.0 in | Wt 106.2 lb

## 2017-04-27 DIAGNOSIS — I10 Essential (primary) hypertension: Secondary | ICD-10-CM

## 2017-04-27 DIAGNOSIS — E876 Hypokalemia: Secondary | ICD-10-CM

## 2017-04-27 DIAGNOSIS — C187 Malignant neoplasm of sigmoid colon: Secondary | ICD-10-CM | POA: Diagnosis not present

## 2017-04-27 DIAGNOSIS — D509 Iron deficiency anemia, unspecified: Secondary | ICD-10-CM

## 2017-04-27 LAB — COMPREHENSIVE METABOLIC PANEL
ALBUMIN: 4.5 g/dL (ref 3.5–5.0)
ALK PHOS: 90 U/L (ref 40–150)
ALT: 7 U/L (ref 0–55)
AST: 16 U/L (ref 5–34)
Anion Gap: 13 mEq/L — ABNORMAL HIGH (ref 3–11)
BILIRUBIN TOTAL: 0.57 mg/dL (ref 0.20–1.20)
BUN: 14 mg/dL (ref 7.0–26.0)
CO2: 22 mEq/L (ref 22–29)
CREATININE: 1.2 mg/dL — AB (ref 0.6–1.1)
Calcium: 9.7 mg/dL (ref 8.4–10.4)
Chloride: 107 mEq/L (ref 98–109)
EGFR: 46 mL/min/{1.73_m2} — ABNORMAL LOW (ref >60)
GLUCOSE: 106 mg/dL (ref 70–140)
Potassium: 3.2 mEq/L — ABNORMAL LOW (ref 3.5–5.1)
SODIUM: 142 meq/L (ref 136–145)
TOTAL PROTEIN: 8 g/dL (ref 6.4–8.3)

## 2017-04-27 LAB — CBC WITH DIFFERENTIAL/PLATELET
BASO%: 0.2 % (ref 0.0–2.0)
BASOS ABS: 0 10*3/uL (ref 0.0–0.1)
EOS%: 3.6 % (ref 0.0–7.0)
Eosinophils Absolute: 0.2 10*3/uL (ref 0.0–0.5)
HEMATOCRIT: 34.3 % — AB (ref 34.8–46.6)
HEMOGLOBIN: 11.6 g/dL (ref 11.6–15.9)
LYMPH#: 0.7 10*3/uL — AB (ref 0.9–3.3)
LYMPH%: 14.8 % (ref 14.0–49.7)
MCH: 31.3 pg (ref 25.1–34.0)
MCHC: 33.8 g/dL (ref 31.5–36.0)
MCV: 92.5 fL (ref 79.5–101.0)
MONO#: 0.5 10*3/uL (ref 0.1–0.9)
MONO%: 10 % (ref 0.0–14.0)
NEUT%: 71.4 % (ref 38.4–76.8)
NEUTROS ABS: 3.6 10*3/uL (ref 1.5–6.5)
Platelets: 246 10*3/uL (ref 145–400)
RBC: 3.71 10*6/uL (ref 3.70–5.45)
RDW: 13.5 % (ref 11.2–14.5)
WBC: 5 10*3/uL (ref 3.9–10.3)

## 2017-04-27 NOTE — Progress Notes (Addendum)
[place]  [phone] [phone] [place] Note   Patient Care Team: Cyndi Bender, Hershal Coria as PCP - General (Physician Assistant) 04/27/2017  REFERRAL PHYSICIAN: Dr. Barry Dienes   CHIEF COMPLAINTS/PURPOSE OF CONSULTATION:  Cancer of Sigmoid Colon   Oncology History   Cancer Staging Cancer of sigmoid colon [facility]) Staging form: Colon and Rectum, AJCC 8th Edition - Pathologic stage from 04/07/2017: Stage IIIA (pT2, pN1b, cM0) - Signed by Truitt Merle, MD on 04/26/2017       Cancer of sigmoid colon ([place])   03/22/2017 Procedure    Colonoscopy per Dr. Marian Sorrow A rectal examination was performed and was normal. The endoscope was then inserted into the rectum and advanced under direct visualization to about 25 cm where a large, near obstructing mass was noted. This had a malignant appearance. It was biopsied. That area could not be traversed with the adult colonoscope. Small amount of bleeding was noted from the biopsy site, but this stopped right away. 7 mm pedunculated polyp was noted at 15 cm. This was removed using hot snare and retrieved for pathology. Ink was injected into the submucosa at 20 cm (5 cm distal to the mass). The endoscope was then withdrawn and the procedure terminated.   Findings / Comments: - Mass at 25 cm. Biopsied. - Polyp removed as above.       04/07/2017 Initial Diagnosis    Cancer of sigmoid colon ([place])     04/07/2017 Pathology Results    Diagnosis Colon, segmental resection for tumor, Sigmoid - INVASIVE COLORECTAL ADENOCARCINOMA, 2.5 CM. - CARCINOMA INVADES MUSCULARIS PROPRIA. - MARGINS NOT INVOLVED. - METASTATIC CARCINOMA IN TWO OF EIGHTEEN LYMPH NODES (2/18) - PROXIMAL AND DISTAL ANASTOMOTIC RINGS FREE OF TUMOR.  Specimen: Sigmoid colon with proximal and distal anastomotic rings. Procedure: Resection. Tumor site: Sigmoid colon. Specimen integrity: Intact. Macroscopic intactness of mesorectum: N/A Macroscopic  tumor perforation: No. Invasive tumor: Maximum size: 2.5 cm. Histologic type(s): Colorectal adenocarcinoma. Histologic grade and differentiation: G3 G1: well differentiated/low grade G2: moderately differentiated/low grade G3: poorly differentiated/high grade G4: undifferentiated/high grade Type of polyp in which invasive carcinoma arose: No residual polyp. Microscopic extension of invasive tumor: Into muscularis propria. Lymph-Vascular invasion: Present. Peri-neural invasion: Present. Tumor deposit(s) (discontinuous extramural extension): No.  Resection margins: Proximal margin: Free of tumor. Distal margin: Free of tumor. Circumferential (radial) (posterior ascending, posterior descending; lateral and posterior mid-rectum; and entire lower 1/3 rectum): N/A Mesenteric margin (sigmoid and transverse): Free of tumor. Distance closest margin (if all above margins negative): 6 cm from mesenteric margin. Treatment effect (neo-adjuvant therapy): No. Additional polyp(s): No. Non-neoplastic findings: N/A Lymph nodes: number examined - 18; number positive: 2 Pathologic Staging: pT2, pN1b, pMX Ancillary studies: MSI by PCR and MMR by IHC. (JDP:gt, 04/11/17)      04/07/2017 Surgery    Laparoscopic sigmoid colectomy per Dr. Barry Dienes      04/19/2017 Imaging    CT chest, abdomen and pelvis with contrast showed no definitive evidence of metastasis, numerous indeterminate subcentimeter hypodense lesions scattered throughout the liver, likely benign.      04/27/2017 Tumor Marker    CEA Pre-op 04/04/17: 1.4 Post-op 04/27/17: 1.27        HISTORY OF PRESENTING ILLNESS: 04/27/17  Elizabeth Durham 76 y.o. female is here because of newly diagnosed colon cancer. She presents to the clinic today accompanied by her daughter. She was referred by Dr. Barry Dienes. In 12/2015 the patient was admitted to [facility] hospital with GI bleed and acute  blood loss anemia, she was also found to be iron deficient. Anemia  improved with blood transfusion and she began taking oral iron supplementation. A colonoscopy was scheduled but ultimately cancelled because she could not tolerate prep. It was not rescheduled. She reports chronic constipation with intermittent blood on tissue after hard BM.  She previously lost weight but has gained it back. Colonoscopy was eventually rescheduled and performed on 03/22/2017 by Dr. Marian Sorrow to follow up on GI bleeding and because she had never had one. Colonoscopy revealed a malignant appearing near obstructing mass at about 25 cm, and a pedunculated polyp at 15 cm; both were biopsied. Colon mass pathology revealed scant fragments of intramucosal adenocarcinoma arising in an adenomatous polyp with high grade dysplasia; polyp biopsy revealed tubulovillous adeno suspicious for high grade dysplasia. She was referred to Dr. Barry Dienes for segmental resection on 04/07/17, pathology revealed invasive adenocarcinoma 2.5cm that invades the muscularis propria, margins were negative, with metastatic disease in 2 of 18 lymph nodes.   Today she is recovering well from surgery. She has good appetite. Continues to have constipation, has not used medication; last BM 1 day ago. She denies pain, nausea, vomiting, blood in stool, or fatigue.  Patient is not currently working. She walks around the block and does yard work for exercise. She performs ADL's independently, lives with her brother and daughter; she is without a car, transportation is unreliable. Family has to borrow friend's car for rides.   REVIEW OF SYSTEMS:  Constitutional: Denies fatigue, fevers, chills or abnormal night sweats (+) weight fluctuation, normal weight 110-115 Eyes: Denies blurriness of vision, double vision or watery eyes Ears, nose, mouth, throat, and face: Denies mucositis or sore throat Respiratory: Denies cough, dyspnea or wheezes Cardiovascular: Denies palpitation, chest discomfort (+) intermittent lower extremity  swelling Gastrointestinal:  Denies nausea, vomiting, diarrhea, heartburn, blood in stool, abdominal pain, or change in bowel habits (+) chronic constipation, last BM 1 day ago (+) s/p segmental tumor resection 04/07/17 Skin: Denies abnormal skin rashes Lymphatics: Denies new lymphadenopathy or easy bruising Neurological:Denies numbness, tingling or new weaknesses Behavioral/Psych: Mood is stable, no new changes  All other systems were reviewed with the patient and are negative.   MEDICAL HISTORY:  Past Medical History:  Diagnosis Date  . Essential hypertension   . GERD (gastroesophageal reflux disease)   . GI bleed   . High cholesterol   . Iron deficiency anemia     SURGICAL HISTORY: Past Surgical History:  Procedure Laterality Date  . COLONOSCOPY    . LAPAROSCOPIC SIGMOID COLECTOMY N/A 04/07/2017   Procedure: LAPAROSCOPIC SIGMOID COLECTOMY, ERAS PATHWAY, splenic flexure takedown;  Surgeon: [name], MD;  Location: [place];  Service: General;  Laterality: N/A;  . TONSILLECTOMY    . TOTAL HIP ARTHROPLASTY Right 2012    SOCIAL HISTORY: Social History   Social History  . Marital status: Widowed    Spouse name: N/A  . Number of children: N/A  . Years of education: N/A   Occupational History  . Not on file.   Social History Main Topics  . Smoking status: Former Smoker    Packs/day: 0.50    Years: 7.00    Types: Cigarettes    Quit date: 74  . Smokeless tobacco: Never Used  . Alcohol use No  . Drug use: No  . Sexual activity: Not on file   Other Topics Concern  . Not on file   Social History Narrative  . No narrative on file  FAMILY HISTORY: Family History  Problem Relation Age of Onset  . Leukemia Mother   . Cancer Father        unknown type  Negative family history for GI cancer; her father had unknown type of cancer   ALLERGIES:  has No Known Allergies.  MEDICATIONS:  Current Outpatient Prescriptions  Medication Sig Dispense Refill  .  alendronate (FOSAMAX) 70 MG tablet Take 70 mg by mouth every 7 (seven) days.  11  . amLODipine (NORVASC) 10 MG tablet Take 10 mg by mouth daily. for blood pressure  1  . atorvastatin (LIPITOR) 10 MG tablet Take 10 mg by mouth daily.  3  . ferrous sulfate 325 (65 FE) MG tablet Take 325 mg by mouth daily.  1  . pantoprazole (PROTONIX) 40 MG tablet Take 1 tablet (40 mg total) by mouth daily. 30 tablet 0  . furosemide (LASIX) 20 MG tablet Take 20 mg by mouth daily as needed for edema.  3  . gabapentin (NEURONTIN) 300 MG capsule Take 1 capsule (300 mg total) by mouth 2 (two) times daily. (Patient not taking: Reported on 04/27/2017) 30 capsule 0  . potassium chloride (K-DUR) 10 MEQ tablet Take 10 mEq by mouth daily as needed.  0  . traMADol (ULTRAM) 50 MG tablet Take 1 tablet (50 mg total) by mouth every 6 (six) hours as needed (mild pain). (Patient not taking: Reported on 04/27/2017) 20 tablet 0   No current facility-administered medications for this visit.     PHYSICAL EXAMINATION: ECOG PERFORMANCE STATUS: 1 - Symptomatic but completely ambulatory  Vitals:   04/27/17 1504  BP: 128/72  Pulse: 71  Resp: 18  Temp: 97.9 F (36.6 C)  SpO2: 99%   Filed Weights   04/27/17 1504  Weight: 106 lb 3.2 oz (48.2 kg)   GENERAL:alert, no distress and comfortable SKIN: skin color, texture, turgor are normal, no rashes or significant lesions EYES: normal, conjunctiva are pink and non-injected, sclera clear OROPHARYNX:no exudate, no erythema and lips, buccal mucosa, and tongue normal  NECK: supple, thyroid normal size, non-tender, without nodularity LYMPH:  no palpable cervical, axillary, or inguinal lymphadenopathy  LUNGS: clear to auscultation bilaterally with normal breathing effort HEART: regular rate & rhythm, no murmurs; trace bilateral lower extremity edema ABDOMEN:abdomen soft, non-tender and normal bowel sounds. No palpable hepatomegaly. 4 abdominal incisions and midline incision covered with  steri strips, healing well, no erythema or drainage Musculoskeletal:no cyanosis of digits and no clubbing  PSYCH: alert & oriented x 3 with fluent speech NEURO: no focal motor/sensory deficits  LABORATORY DATA:  I have reviewed the data as listed CBC Latest Ref Rng & Units 04/27/2017 04/10/2017 04/09/2017  WBC 3.9 - 10.3 10e3/uL 5.0 3.9(L) 4.5  Hemoglobin 11.6 - 15.9 g/dL 11.6 10.3(L) 9.9(L)  Hematocrit 34.8 - 46.6 % 34.3(L) 30.6(L) 30.1(L)  Platelets 145 - 400 10e3/uL 246 183 207    CMP Latest Ref Rng & Units 04/27/2017 04/10/2017 04/09/2017  Glucose 70 - 140 mg/dl 106 101(H) 116(H)  BUN 7.0 - 26.0 mg/dL 14.0 <5(L) <5(L)  Creatinine 0.6 - 1.1 mg/dL 1.2(H) 0.82 0.90  Sodium 136 - 145 mEq/L 142 138 136  Potassium 3.5 - 5.1 mEq/L 3.2(L) 3.7 4.0  Chloride 101 - 111 mmol/L - 105 105  CO2 22 - 29 mEq/L '22 26 25  ' Calcium 8.4 - 10.4 mg/dL 9.7 8.7(L) 8.5(L)  Total Protein 6.4 - 8.3 g/dL 8.0 - -  Total Bilirubin 0.20 - 1.20 mg/dL 0.57 - -  Alkaline  Phos 40 - 150 U/L 90 - -  AST 5 - 34 U/L 16 - -  ALT 0 - 55 U/L 7 - -    RADIOGRAPHIC STUDIES: I have personally reviewed the radiological images as listed and agreed with the findings in the report. Ct Chest W Contrast  Result Date: 04/19/2017 CLINICAL DATA:  New diagnosis of sigmoid colon cancer. Status post laparoscopic sigmoid colectomy 04/07/2017. EXAM: CT CHEST, ABDOMEN, AND PELVIS WITH CONTRAST TECHNIQUE: Multidetector CT imaging of the chest, abdomen and pelvis was performed following the standard protocol during bolus administration of intravenous contrast. CONTRAST:  199m ISOVUE-300 IOPAMIDOL (ISOVUE-300) INJECTION 61% COMPARISON:  03/21/2011 chest CT. FINDINGS: CT CHEST FINDINGS Cardiovascular: Normal heart size. No significant pericardial fluid/thickening. Atherosclerotic nonaneurysmal thoracic aorta. Normal caliber pulmonary arteries. No central pulmonary emboli. Mediastinum/Nodes: Subcentimeter hypodense bilateral thyroid lobe nodules.  Unremarkable esophagus. No pathologically enlarged axillary, mediastinal or hilar lymph nodes. Lungs/Pleura: No pneumothorax. No pleural effusion. Calcified 2 mm left lower lobe granuloma. No acute consolidative airspace disease, lung masses or significant pulmonary nodules. Musculoskeletal: No aggressive appearing focal osseous lesions. Mild thoracic spondylosis. Healed deformity in the manubrium. CT ABDOMEN PELVIS FINDINGS Hepatobiliary: Normal liver size. Several small simple liver cysts scattered throughout the liver, largest 2.1 cm in the posterior right liver lobe. Numerous (greater than 10) subcentimeter hypodense lesions scattered throughout the liver, too small to characterize, most of which appear to have been present on the 03/21/2011 chest CT study, suggesting benign lesions. Cholelithiasis, with no gallbladder wall thickening, gallbladder distention or pericholecystic fluid . No biliary ductal dilatation. Pancreas: Normal, with no mass or duct dilation. Spleen: Normal size. No mass. Adrenals/Urinary Tract: No discrete adrenal nodules. Simple 2.7 cm medial upper left renal cyst. Additional subcentimeter hypodense renal cortical lesions in both kidneys are too small to characterize and require no follow-up. No hydronephrosis. Limited bladder visualization due to streak artifact from left hip hardware, with no gross bladder abnormality. Stomach/Bowel: Normal nondistended stomach. Normal caliber small bowel with no small bowel wall thickening. Appendix not discretely visualized. No pericecal inflammatory changes. Status post subtotal distal colectomy with intact appearing colorectal anastomosis. Relatively collapsed large bowel with no definite large bowel wall thickening or unexpected pericolonic fat stranding. Oral contrast traverses to the ascending colon. Vascular/Lymphatic: Atherosclerotic nonaneurysmal abdominal aorta. Patent portal, splenic, hepatic and renal veins. No pathologically enlarged lymph  nodes in the abdomen or pelvis. Reproductive: Grossly normal uterus.  No adnexal mass. Other: Small amount of free fluid in the pelvic cul-de-sac and left adnexa, with no measurable fluid collections. No pneumoperitoneum. Vertical subcutaneous postsurgical fat stranding in the ventral lower abdominal wall. No superficial fluid collections. Musculoskeletal: No aggressive appearing focal osseous lesions. Mild lumbar spondylosis. Left total hip arthroplasty. IMPRESSION: 1. No definite evidence of metastatic disease in the chest, abdomen or pelvis. 2. Numerous indeterminate subcentimeter hypodense lesions scattered throughout the liver, too small to characterize, more likely benign. Recommend attention on follow-up MRI (preferred) abdomen without and with IV contrast or CT abdomen with IV contrast in 3-6 months. This recommendation follows ACR consensus guidelines: Managing Incidental Findings on Abdominal CT: White Paper of the ACR Incidental Findings Committee. J Am Coll Radiol 2010;7:754-773. 3. Nonspecific small volume free fluid in the pelvis, probably postsurgical. 4. Chronic findings include: Aortic Atherosclerosis (ICD10-I70.0). Cholelithiasis. Electronically Signed   By: JIlona SorrelM.D.   On: 04/19/2017 15:55   Ct Abdomen Pelvis W Contrast  Result Date: 04/19/2017 CLINICAL DATA:  New diagnosis of sigmoid colon cancer. Status post  laparoscopic sigmoid colectomy 04/07/2017. EXAM: CT CHEST, ABDOMEN, AND PELVIS WITH CONTRAST TECHNIQUE: Multidetector CT imaging of the chest, abdomen and pelvis was performed following the standard protocol during bolus administration of intravenous contrast. CONTRAST:  175m ISOVUE-300 IOPAMIDOL (ISOVUE-300) INJECTION 61% COMPARISON:  03/21/2011 chest CT. FINDINGS: CT CHEST FINDINGS Cardiovascular: Normal heart size. No significant pericardial fluid/thickening. Atherosclerotic nonaneurysmal thoracic aorta. Normal caliber pulmonary arteries. No central pulmonary emboli.  Mediastinum/Nodes: Subcentimeter hypodense bilateral thyroid lobe nodules. Unremarkable esophagus. No pathologically enlarged axillary, mediastinal or hilar lymph nodes. Lungs/Pleura: No pneumothorax. No pleural effusion. Calcified 2 mm left lower lobe granuloma. No acute consolidative airspace disease, lung masses or significant pulmonary nodules. Musculoskeletal: No aggressive appearing focal osseous lesions. Mild thoracic spondylosis. Healed deformity in the manubrium. CT ABDOMEN PELVIS FINDINGS Hepatobiliary: Normal liver size. Several small simple liver cysts scattered throughout the liver, largest 2.1 cm in the posterior right liver lobe. Numerous (greater than 10) subcentimeter hypodense lesions scattered throughout the liver, too small to characterize, most of which appear to have been present on the 03/21/2011 chest CT study, suggesting benign lesions. Cholelithiasis, with no gallbladder wall thickening, gallbladder distention or pericholecystic fluid . No biliary ductal dilatation. Pancreas: Normal, with no mass or duct dilation. Spleen: Normal size. No mass. Adrenals/Urinary Tract: No discrete adrenal nodules. Simple 2.7 cm medial upper left renal cyst. Additional subcentimeter hypodense renal cortical lesions in both kidneys are too small to characterize and require no follow-up. No hydronephrosis. Limited bladder visualization due to streak artifact from left hip hardware, with no gross bladder abnormality. Stomach/Bowel: Normal nondistended stomach. Normal caliber small bowel with no small bowel wall thickening. Appendix not discretely visualized. No pericecal inflammatory changes. Status post subtotal distal colectomy with intact appearing colorectal anastomosis. Relatively collapsed large bowel with no definite large bowel wall thickening or unexpected pericolonic fat stranding. Oral contrast traverses to the ascending colon. Vascular/Lymphatic: Atherosclerotic nonaneurysmal abdominal aorta. Patent  portal, splenic, hepatic and renal veins. No pathologically enlarged lymph nodes in the abdomen or pelvis. Reproductive: Grossly normal uterus.  No adnexal mass. Other: Small amount of free fluid in the pelvic cul-de-sac and left adnexa, with no measurable fluid collections. No pneumoperitoneum. Vertical subcutaneous postsurgical fat stranding in the ventral lower abdominal wall. No superficial fluid collections. Musculoskeletal: No aggressive appearing focal osseous lesions. Mild lumbar spondylosis. Left total hip arthroplasty. IMPRESSION: 1. No definite evidence of metastatic disease in the chest, abdomen or pelvis. 2. Numerous indeterminate subcentimeter hypodense lesions scattered throughout the liver, too small to characterize, more likely benign. Recommend attention on follow-up MRI (preferred) abdomen without and with IV contrast or CT abdomen with IV contrast in 3-6 months. This recommendation follows ACR consensus guidelines: Managing Incidental Findings on Abdominal CT: White Paper of the ACR Incidental Findings Committee. J Am Coll Radiol 2010;7:754-773. 3. Nonspecific small volume free fluid in the pelvis, probably postsurgical. 4. Chronic findings include: Aortic Atherosclerosis (ICD10-I70.0). Cholelithiasis. Electronically Signed   By: JIlona SorrelM.D.   On: 04/19/2017 15:55    ASSESSMENT & PLAN:  Elizabeth TEASDALEis a 76y.o. female with a history of GI bleed, anemia, HTN, and sigmoid colon cancer    1. Cancer of Sigmoid Colon, invasive adenocarcinoma, grade 3, pT2, pN1b, cM0, stage IIIA -I discussed her colonoscopy, imaging, and surgical pathology findings in detail with the patient and family -Staging CT CAP shows no definite metastatic disease but does show numerous indeterminate sub-cm hypodense lesions scattered throughout the liver, too small to characterize but likely benign -we will  f/u with MRI ABD w/wo contrast in 1-2 weeks -I reviewed her moderate risk for cancer recurrence after  surgery -adjuvant chemotherapy is recommended, which is the standard care for stage III colon cancer. She is 75, but overall healthy and fit, no major medical comorbidities, has good performance status. Would be a candidate for chemo.  I discussed the option of FOLFOX q2 weeks vs CAPOX q3 weeks with xeloda daily BID x2 weeks with 1 week off; the patient is interested in pursuing chemotherapy -I suspect she would tolerate FOLFOX better overall, but due to her transportation issues and lack of a car, she prefers CAPOX on q3 week schedule.  -Based on recently published data, I recommend a total of 3 months of adjuvant chemo  -Chemotherapy consent: Side effects including but does not not limited to, fatigue, nausea, vomiting, diarrhea, hair loss, neuropathy, palmar-plantar erythema, fluid retention, renal and kidney dysfunction, neutropenic fever, needed for blood transfusion, bleeding, coronary artery spasm, heart failure, were discussed with patient in great detail. She agrees to proceed. -the goal of chemo is curative  -she is recovering from surgery well, surgical incisions are healing well; plan to begin chemotherapy 11/8.  -she will attend chemo class in next 1-2 weeks -The option of port-a-cath placement was review with the patient, including discussion on the advantages and disadvantages of having a port-a-cath placed. After discussion, the patient prefers not to have a port, overuse her peripheral line for chemotherapy. --I discussed the surveillance plan, which is a physical exam and lab test (including CBC, CMP and CEA) every 3-4 months for the first 2 years, then every 6-12 months, colonoscopy in one year, and surveilliance CT scan every 6-12 month for up to 5 year.    2. Anemia, iron deficiency -she has been iron deficient in the past, on oral ferrous sulfate 1 tab daily -I will check iron studies today to determine whether she needs IV feraheme  3. Hypokalemia -she takes oral K 10 mEq PRN  with lasix  -K 3.2 today, I recommend she take 20 mEq daily   -will recheck Cmet in 2 weeks with first cycle of CAPOX  4. Elevated creatinine  -Creatinine as fluctuated over the past few weeks, highest 1.24 on 04/04/17 and; normal in 7/ 2017  -I recommend she increase her fluid intake  PLAN: -MRI abdomen w/wo contrast in 1-2 weeks -chemo class same day as MRI -increase oral K to 20 mEq daily -increase water intake  -return 11/8 for f/u and chemo oxaliplatin and begin xeloda, I will send prescription of Xeloda to our oral pharmacy    Orders Placed This Encounter  Procedures  . MR Abdomen W Wo Contrast    Standing Status:   Future    Standing Expiration Date:   06/27/2018    Order Specific Question:   If indicated for the ordered procedure, I authorize the administration of contrast media per Radiology protocol    Answer:   Yes    Order Specific Question:   What is the patient's sedation requirement?    Answer:   No Sedation    Order Specific Question:   Does the patient have a pacemaker or implanted devices?    Answer:   No    Order Specific Question:   Radiology Contrast Protocol - do NOT remove file path    Answer:   [email or web address]    Order Specific Question:   Reason for Exam additional comments    Answer:   sigmoid colon cancer with  liver lesions, r/o metastatic disease    Order Specific Question:   Preferred imaging location?    Answer:   [facility] (table limit-350 lbs)  . CBC with Differential    Standing Status:   Standing    Number of Occurrences:   100    Standing Expiration Date:   04/27/2018  . Comprehensive metabolic panel    Standing Status:   Standing    Number of Occurrences:   100    Standing Expiration Date:   04/27/2018  . Ferritin    Standing Status:   Standing    Number of Occurrences:   50    Standing Expiration Date:   04/27/2018  . Iron and TIBC    Standing Status:   Standing    Number of Occurrences:   50     Standing Expiration Date:   04/27/2018  . CEA    Standing Status:   Standing    Number of Occurrences:   50    Standing Expiration Date:   04/27/2018    All questions were answered. The patient knows to call the clinic with any problems, questions or concerns.      Alla Feeling, NP 04/27/2017 9:36 PM   I have seen the patient, examined her. I agree with the assessment and and plan and have edited the notes.   Truitt Merle  04/27/2017

## 2017-04-27 NOTE — Patient Instructions (Signed)
Leucovorin injection What is this medicine? LEUCOVORIN (loo koe VOR in) is used to prevent or treat the harmful effects of some medicines. This medicine is used to treat anemia caused by a low amount of folic acid in the body. It is also used with 5-fluorouracil (5-FU) to treat colon cancer. This medicine may be used for other purposes; ask your health care provider or pharmacist if you have questions. What should I tell my health care provider before I take this medicine? They need to know if you have any of these conditions: -anemia from low levels of vitamin B-12 in the blood -an unusual or allergic reaction to leucovorin, folic acid, other medicines, foods, dyes, or preservatives -pregnant or trying to get pregnant -breast-feeding How should I use this medicine? This medicine is for injection into a muscle or into a vein. It is given by a health care professional in a hospital or clinic setting. Talk to your pediatrician regarding the use of this medicine in children. Special care may be needed. Overdosage: If you think you have taken too much of this medicine contact a poison control center or emergency room at once. NOTE: This medicine is only for you. Do not share this medicine with others. What if I miss a dose? This does not apply. What may interact with this medicine? -capecitabine -fluorouracil -phenobarbital -phenytoin -primidone -trimethoprim-sulfamethoxazole This list may not describe all possible interactions. Give your health care provider a list of all the medicines, herbs, non-prescription drugs, or dietary supplements you use. Also tell them if you smoke, drink alcohol, or use illegal drugs. Some items may interact with your medicine. What should I watch for while using this medicine? Your condition will be monitored carefully while you are receiving this medicine. This medicine may increase the side effects of 5-fluorouracil, 5-FU. Tell your doctor or health care  professional if you have diarrhea or mouth sores that do not get better or that get worse. What side effects may I notice from receiving this medicine? Side effects that you should report to your doctor or health care professional as soon as possible: -allergic reactions like skin rash, itching or hives, swelling of the face, lips, or tongue -breathing problems -fever, infection -mouth sores -unusual bleeding or bruising -unusually weak or tired Side effects that usually do not require medical attention (report to your doctor or health care professional if they continue or are bothersome): -constipation or diarrhea -loss of appetite -nausea, vomiting This list may not describe all possible side effects. Call your doctor for medical advice about side effects. You may report side effects to FDA at 1-800-FDA-1088. Where should I keep my medicine? This drug is given in a hospital or clinic and will not be stored at home. NOTE: This sheet is a summary. It may not cover all possible information. If you have questions about this medicine, talk to your doctor, pharmacist, or health care provider.  2018 Else[employer]/Gold Standard (2007-12-26 16:50:29) Capecitabine tablets What is this medicine? CAPECITABINE (ka pe SITE a been) is a chemotherapy drug. It slows the growth of cancer cells. This medicine is used to treat breast cancer, and also colon or rectal cancer. This medicine may be used for other purposes; ask your health care provider or pharmacist if you have questions. COMMON BRAND NAME(S): Xeloda What should I tell my health care provider before I take this medicine? They need to know if you have any of these conditions: -bleeding or blood disorders -dihydropyrimidine dehydrogenase (DPD) deficiency -heart disease -  infection (especially a virus infection such as chickenpox, cold sores, or herpes) -kidney disease -liver disease -an unusual or allergic reaction to capecitabine, 5-fluorouracil,  other medicines, foods, dyes, or preservatives -pregnant or trying to get pregnant -breast-feeding How should I use this medicine? Take this medicine by mouth with a glass of water, within 30 minutes of the end of a meal. Do not cut, crush or chew this medicine. Follow the directions on the prescription label. Take your medicine at regular intervals. Do not take it more often than directed. Do not stop taking except on your doctor's advice. Your doctor may want you to take a combination of 150 mg and 500 mg tablets for each dose. It is very important that you know how to correctly take your dose. Taking the wrong tablets could result in an overdose (too much medication) or underdose (too little medication). Talk to your pediatrician regarding the use of this medicine in children. Special care may be needed. Overdosage: If you think you have taken too much of this medicine contact a poison control center or emergency room at once. NOTE: This medicine is only for you. Do not share this medicine with others. What if I miss a dose? If you miss a dose, do not take the missed dose at all. Do not take double or extra doses. Instead, continue with your next scheduled dose and check with your doctor. What may interact with this medicine? -antacids with aluminum and/or magnesium -folic acid -leucovorin -medicines to increase blood counts like filgrastim, pegfilgrastim, sargramostim -phenytoin -vaccines -warfarin Talk to your doctor or health care professional before taking any of these medicines: -acetaminophen -aspirin -ibuprofen -ketoprofen -naproxen This list may not describe all possible interactions. Give your health care provider a list of all the medicines, herbs, non-prescription drugs, or dietary supplements you use. Also tell them if you smoke, drink alcohol, or use illegal drugs. Some items may interact with your medicine. What should I watch for while using this medicine? Visit your  doctor for checks on your progress. This drug may make you feel generally unwell. This is not uncommon, as chemotherapy can affect healthy cells as well as cancer cells. Report any side effects. Continue your course of treatment even though you feel ill unless your doctor tells you to stop. In some cases, you may be given additional medicines to help with side effects. Follow all directions for their use. Call your doctor or health care professional for advice if you get a fever, chills or sore throat, or other symptoms of a cold or flu. Do not treat yourself. This drug decreases your body's ability to fight infections. Try to avoid being around people who are sick. This medicine may increase your risk to bruise or bleed. Call your doctor or health care professional if you notice any unusual bleeding. Be careful brushing and flossing your teeth or using a toothpick because you may get an infection or bleed more easily. If you have any dental work done, tell your dentist you are receiving this medicine. Avoid taking products that contain aspirin, acetaminophen, ibuprofen, naproxen, or ketoprofen unless instructed by your doctor. These medicines may hide a fever. Do not become pregnant while taking this medicine or for 6 months after stopping it. Women should inform their doctor if they wish to become pregnant or think they might be pregnant. There is a potential for serious side effects to an unborn child. Talk to your health care professional or pharmacist for more information. Do not  breast-feed an infant while taking this medicine or for 2 weeks after stopping it. Men are advised not to father a child while taking this medicine or for 3 months after stopping it. This medicine may make it more difficult to get pregnant or father a child. Talk with your doctor or health care professional if you are concerned about your fertility. What side effects may I notice from receiving this medicine? Side effects  that you should report to your doctor or health care professional as soon as possible: -allergic reactions like skin rash, itching or hives, swelling of the face, lips, or tongue -low blood counts - this medicine may decrease the number of white blood cells, red blood cells and platelets. You may be at increased risk for infections and bleeding. -signs of infection - fever or chills, cough, sore throat, pain or difficulty passing urine -signs of decreased platelets or bleeding - bruising, pinpoint red spots on the skin, black, tarry stools, blood in the urine -signs of decreased red blood cells - unusually weak or tired, fainting spells, lightheadedness -breathing problems -changes in vision -chest pain -dark urine -diarrhea of more than 4 bowel movements in one day or any diarrhea at night; bloody or watery diarrhea -dizziness -mouth sores -nausea and vomiting -pain, tingling, numbness in the hands or feet -redness, swelling, or sores on hands or feet -stomach pain -vomiting -yellow color of skin or eyes Side effects that usually do not require medical attention (report to your doctor or health care professional if they continue or are bothersome): -constipation -diarrhea -dry or itchy skin -hair loss -loss of appetite -nausea -weak or tired This list may not describe all possible side effects. Call your doctor for medical advice about side effects. You may report side effects to FDA at 1-800-FDA-1088. Where should I keep my medicine? Keep out of the reach of children. Store at room temperature between 15 and 30 degrees C (59 and 86 degrees F). Keep container tightly closed. Throw away any unused medicine after the expiration date. NOTE: This sheet is a summary. It may not cover all possible information. If you have questions about this medicine, talk to your doctor, pharmacist, or health care provider.  2018 [employer]/Gold Standard (2015-07-09 13:11:21) Fluorouracil, 5-FU  injection What is this medicine? FLUOROURACIL, 5-FU (flure oh YOOR a sil) is a chemotherapy drug. It slows the growth of cancer cells. This medicine is used to treat many types of cancer like breast cancer, colon or rectal cancer, pancreatic cancer, and stomach cancer. This medicine may be used for other purposes; ask your health care provider or pharmacist if you have questions. COMMON BRAND NAME(S): Adrucil What should I tell my health care provider before I take this medicine? They need to know if you have any of these conditions: -blood disorders -dihydropyrimidine dehydrogenase (DPD) deficiency -infection (especially a virus infection such as chickenpox, cold sores, or herpes) -kidney disease -liver disease -malnourished, poor nutrition -recent or ongoing radiation therapy -an unusual or allergic reaction to fluorouracil, other chemotherapy, other medicines, foods, dyes, or preservatives -pregnant or trying to get pregnant -breast-feeding How should I use this medicine? This drug is given as an infusion or injection into a vein. It is administered in a hospital or clinic by a specially trained health care professional. Talk to your pediatrician regarding the use of this medicine in children. Special care may be needed. Overdosage: If you think you have taken too much of this medicine contact a poison control center or emergency  room at once. NOTE: This medicine is only for you. Do not share this medicine with others. What if I miss a dose? It is important not to miss your dose. Call your doctor or health care professional if you are unable to keep an appointment. What may interact with this medicine? -allopurinol -cimetidine -dapsone -digoxin -hydroxyurea -leucovorin -levamisole -medicines for seizures like ethotoin, fosphenytoin, phenytoin -medicines to increase blood counts like filgrastim, pegfilgrastim, sargramostim -medicines that treat or prevent blood clots like  warfarin, enoxaparin, and dalteparin -methotrexate -metronidazole -pyrimethamine -some other chemotherapy drugs like busulfan, cisplatin, estramustine, vinblastine -trimethoprim -trimetrexate -vaccines Talk to your doctor or health care professional before taking any of these medicines: -acetaminophen -aspirin -ibuprofen -ketoprofen -naproxen This list may not describe all possible interactions. Give your health care provider a list of all the medicines, herbs, non-prescription drugs, or dietary supplements you use. Also tell them if you smoke, drink alcohol, or use illegal drugs. Some items may interact with your medicine. What should I watch for while using this medicine? Visit your doctor for checks on your progress. This drug may make you feel generally unwell. This is not uncommon, as chemotherapy can affect healthy cells as well as cancer cells. Report any side effects. Continue your course of treatment even though you feel ill unless your doctor tells you to stop. In some cases, you may be given additional medicines to help with side effects. Follow all directions for their use. Call your doctor or health care professional for advice if you get a fever, chills or sore throat, or other symptoms of a cold or flu. Do not treat yourself. This drug decreases your body's ability to fight infections. Try to avoid being around people who are sick. This medicine may increase your risk to bruise or bleed. Call your doctor or health care professional if you notice any unusual bleeding. Be careful brushing and flossing your teeth or using a toothpick because you may get an infection or bleed more easily. If you have any dental work done, tell your dentist you are receiving this medicine. Avoid taking products that contain aspirin, acetaminophen, ibuprofen, naproxen, or ketoprofen unless instructed by your doctor. These medicines may hide a fever. Do not become pregnant while taking this medicine.  Women should inform their doctor if they wish to become pregnant or think they might be pregnant. There is a potential for serious side effects to an unborn child. Talk to your health care professional or pharmacist for more information. Do not breast-feed an infant while taking this medicine. Men should inform their doctor if they wish to father a child. This medicine may lower sperm counts. Do not treat diarrhea with over the counter products. Contact your doctor if you have diarrhea that lasts more than 2 days or if it is severe and watery. This medicine can make you more sensitive to the sun. Keep out of the sun. If you cannot avoid being in the sun, wear protective clothing and use sunscreen. Do not use sun lamps or tanning beds/booths. What side effects may I notice from receiving this medicine? Side effects that you should report to your doctor or health care professional as soon as possible: -allergic reactions like skin rash, itching or hives, swelling of the face, lips, or tongue -low blood counts - this medicine may decrease the number of white blood cells, red blood cells and platelets. You may be at increased risk for infections and bleeding. -signs of infection - fever or chills, cough, sore  throat, pain or difficulty passing urine -signs of decreased platelets or bleeding - bruising, pinpoint red spots on the skin, black, tarry stools, blood in the urine -signs of decreased red blood cells - unusually weak or tired, fainting spells, lightheadedness -breathing problems -changes in vision -chest pain -mouth sores -nausea and vomiting -pain, swelling, redness at site where injected -pain, tingling, numbness in the hands or feet -redness, swelling, or sores on hands or feet -stomach pain -unusual bleeding Side effects that usually do not require medical attention (report to your doctor or health care professional if they continue or are bothersome): -changes in finger or toe  nails -diarrhea -dry or itchy skin -hair loss -headache -loss of appetite -sensitivity of eyes to the light -stomach upset -unusually teary eyes This list may not describe all possible side effects. Call your doctor for medical advice about side effects. You may report side effects to FDA at 1-800-FDA-1088. Where should I keep my medicine? This drug is given in a hospital or clinic and will not be stored at home. NOTE: This sheet is a summary. It may not cover all possible information. If you have questions about this medicine, talk to your doctor, pharmacist, or health care provider.  2018 Else[employer]/Gold Standard (2007-10-25 13:53:16) Oxaliplatin Injection What is this medicine? OXALIPLATIN (ox AL i PLA tin) is a chemotherapy drug. It targets fast dividing cells, like cancer cells, and causes these cells to die. This medicine is used to treat cancers of the colon and rectum, and many other cancers. This medicine may be used for other purposes; ask your health care provider or pharmacist if you have questions. COMMON BRAND NAME(S): Eloxatin What should I tell my health care provider before I take this medicine? They need to know if you have any of these conditions: -kidney disease -an unusual or allergic reaction to oxaliplatin, other chemotherapy, other medicines, foods, dyes, or preservatives -pregnant or trying to get pregnant -breast-feeding How should I use this medicine? This drug is given as an infusion into a vein. It is administered in a hospital or clinic by a specially trained health care professional. Talk to your pediatrician regarding the use of this medicine in children. Special care may be needed. Overdosage: If you think you have taken too much of this medicine contact a poison control center or emergency room at once. NOTE: This medicine is only for you. Do not share this medicine with others. What if I miss a dose? It is important not to miss a dose. Call your doctor  or health care professional if you are unable to keep an appointment. What may interact with this medicine? -medicines to increase blood counts like filgrastim, pegfilgrastim, sargramostim -probenecid -some antibiotics like amikacin, gentamicin, neomycin, polymyxin B, streptomycin, tobramycin -zalcitabine Talk to your doctor or health care professional before taking any of these medicines: -acetaminophen -aspirin -ibuprofen -ketoprofen -naproxen This list may not describe all possible interactions. Give your health care provider a list of all the medicines, herbs, non-prescription drugs, or dietary supplements you use. Also tell them if you smoke, drink alcohol, or use illegal drugs. Some items may interact with your medicine. What should I watch for while using this medicine? Your condition will be monitored carefully while you are receiving this medicine. You will need important blood work done while you are taking this medicine. This medicine can make you more sensitive to cold. Do not drink cold drinks or use ice. Cover exposed skin before coming in contact with cold temperatures  or cold objects. When out in cold weather wear warm clothing and cover your mouth and nose to warm the air that goes into your lungs. Tell your doctor if you get sensitive to the cold. This drug may make you feel generally unwell. This is not uncommon, as chemotherapy can affect healthy cells as well as cancer cells. Report any side effects. Continue your course of treatment even though you feel ill unless your doctor tells you to stop. In some cases, you may be given additional medicines to help with side effects. Follow all directions for their use. Call your doctor or health care professional for advice if you get a fever, chills or sore throat, or other symptoms of a cold or flu. Do not treat yourself. This drug decreases your body's ability to fight infections. Try to avoid being around people who are sick. This  medicine may increase your risk to bruise or bleed. Call your doctor or health care professional if you notice any unusual bleeding. Be careful brushing and flossing your teeth or using a toothpick because you may get an infection or bleed more easily. If you have any dental work done, tell your dentist you are receiving this medicine. Avoid taking products that contain aspirin, acetaminophen, ibuprofen, naproxen, or ketoprofen unless instructed by your doctor. These medicines may hide a fever. Do not become pregnant while taking this medicine. Women should inform their doctor if they wish to become pregnant or think they might be pregnant. There is a potential for serious side effects to an unborn child. Talk to your health care professional or pharmacist for more information. Do not breast-feed an infant while taking this medicine. Call your doctor or health care professional if you get diarrhea. Do not treat yourself. What side effects may I notice from receiving this medicine? Side effects that you should report to your doctor or health care professional as soon as possible: -allergic reactions like skin rash, itching or hives, swelling of the face, lips, or tongue -low blood counts - This drug may decrease the number of white blood cells, red blood cells and platelets. You may be at increased risk for infections and bleeding. -signs of infection - fever or chills, cough, sore throat, pain or difficulty passing urine -signs of decreased platelets or bleeding - bruising, pinpoint red spots on the skin, black, tarry stools, nosebleeds -signs of decreased red blood cells - unusually weak or tired, fainting spells, lightheadedness -breathing problems -chest pain, pressure -cough -diarrhea -jaw tightness -mouth sores -nausea and vomiting -pain, swelling, redness or irritation at the injection site -pain, tingling, numbness in the hands or feet -problems with balance, talking, walking -redness,  blistering, peeling or loosening of the skin, including inside the mouth -trouble passing urine or change in the amount of urine Side effects that usually do not require medical attention (report to your doctor or health care professional if they continue or are bothersome): -changes in vision -constipation -hair loss -loss of appetite -metallic taste in the mouth or changes in taste -stomach pain This list may not describe all possible side effects. Call your doctor for medical advice about side effects. You may report side effects to FDA at 1-800-FDA-1088. Where should I keep my medicine? This drug is given in a hospital or clinic and will not be stored at home. NOTE: This sheet is a summary. It may not cover all possible information. If you have questions about this medicine, talk to your doctor, pharmacist, or health care provider.  2018 Elsevier/Gold Standard (2008-01-16 17:22:47)

## 2017-04-27 NOTE — Telephone Encounter (Signed)
Printed avs and calender for upcoming appointment. Per 10/24 los

## 2017-04-28 LAB — IRON AND TIBC
%SAT: 9 % — AB (ref 21–57)
Iron: 33 ug/dL — ABNORMAL LOW (ref 41–142)
TIBC: 365 ug/dL (ref 236–444)
UIBC: 332 ug/dL (ref 120–384)

## 2017-04-28 LAB — FERRITIN: FERRITIN: 44 ng/mL (ref 9–269)

## 2017-04-28 LAB — CEA (IN HOUSE-CHCC): CEA (CHCC-IN HOUSE): 1.27 ng/mL (ref 0.00–5.00)

## 2017-04-28 NOTE — Progress Notes (Signed)
04/27/17: Late entry: I met with patient and her daughter prior to her appointment with Cira Rue NP. I provided patient with written information from [email or web address] r/t colon cancer and it's staging as well as handouts on tips and tricks for Chemo. I also provided patient with my contact information for further questions or concerns. Patient stated that she does not have a a computer or Internet access and that she would appreciate phone calls.

## 2017-04-29 ENCOUNTER — Telehealth: Payer: Self-pay | Admitting: Nurse Practitioner

## 2017-04-29 ENCOUNTER — Encounter: Payer: Self-pay | Admitting: Hematology

## 2017-04-29 ENCOUNTER — Encounter: Payer: Self-pay | Admitting: *Deleted

## 2017-04-29 MED ORDER — PROCHLORPERAZINE MALEATE 10 MG PO TABS: 10.0000 mg | ORAL_TABLET | Freq: Four times a day (QID) | ORAL | 1 refills | Status: DC | PRN

## 2017-04-29 MED ORDER — CAPECITABINE 500 MG PO TABS: 1000.0000 mg/m2 | ORAL_TABLET | Freq: Two times a day (BID) | ORAL | 3 refills | Status: DC

## 2017-04-29 MED ORDER — ONDANSETRON HCL 8 MG PO TABS: 8.0000 mg | ORAL_TABLET | Freq: Two times a day (BID) | ORAL | 1 refills | Status: DC | PRN

## 2017-04-29 NOTE — Progress Notes (Signed)
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

## 2017-04-29 NOTE — Telephone Encounter (Signed)
I reviewed iron studies with Elizabeth Durham. Her serum iron is low as well as transferrin saturation, ferritin is normal and Hgb is 11.6. She has been on oral iron supplementation for approx one year. I discussed with Dr. Burr Medico. It appears her oral iron is not enough, we recommend IV feraheme; the patient agrees. The patient has transportation difficulties, and would prefer to have this while here for other appointments. I will arrange for her to receive a dose while here for chemo on 11/8. We also discussed the financial toxicity research study. She qualifies and is interested. She prefers correspondence by mail rather than email. I informed the research team and she will be consented Monday 10/29 when here for chemo class.

## 2017-05-02 ENCOUNTER — Telehealth: Payer: Self-pay | Admitting: Pharmacy Technician

## 2017-05-02 ENCOUNTER — Encounter: Payer: Self-pay | Admitting: *Deleted

## 2017-05-02 ENCOUNTER — Telehealth: Payer: Self-pay | Admitting: Pharmacist

## 2017-05-02 ENCOUNTER — Other Ambulatory Visit: Payer: Medicare HMO

## 2017-05-02 DIAGNOSIS — C187 Malignant neoplasm of sigmoid colon: Secondary | ICD-10-CM

## 2017-05-02 MED ORDER — CAPECITABINE 500 MG PO TABS: ORAL_TABLET | ORAL | 0 refills | Status: DC

## 2017-05-02 MED ORDER — CAPECITABINE 500 MG PO TABS: 1000.0000 mg/m2 | ORAL_TABLET | Freq: Two times a day (BID) | ORAL | 3 refills | Status: DC

## 2017-05-02 NOTE — Addendum Note (Signed)
Addended by: Truitt Merle on: 05/02/2017 08:27 AM   Modules accepted: Orders

## 2017-05-02 NOTE — Telephone Encounter (Signed)
Oral Oncology Patient Advocate Encounter  Received notification from [place] that prior authorization for Xeloda is required.  PA submitted on CoverMyMeds Key [id number] Status is pending  Oral Oncology Clinic will continue to follow.  Fabio Asa. Melynda Keller, [place] Patient [place] [phone] 05/02/2017 10:23 AM

## 2017-05-02 NOTE — Telephone Encounter (Signed)
Oral Oncology Pharmacist Encounter  Received new prescription for Xeloda (capecitabine) for the adjuvant treatment of colon cancer in conjunction with infusional oxaliplatin, planned duration 3-6 months.  Labs from 04/27/17 assessed, OK for treatment. Noted SCr=1.2, est CrCl~30 mL/min, discussed with MD, both oxaliplatin and Xeloda will be reduced 25% for cycle 1, then reassessed.  Current medication list in Epic reviewed, no significant DDIs with Xeloda identified.  Prescription has been e-scribed to the [facility] for benefits analysis and approval. Prior Josem Kaufmann has been submitted and is pending. Oral Oncology Patient Advocate will follow-up with patient about insurance authorization and copayment.   I spoke with patient in chemotherapy education class for overview of: Xeloda (capecitabine).   Pt is doing well. Counseled patient on administration, dosing, side effects, monitoring, drug-food interactions, safe handling, storage, and disposal.  Patient will take Xeloda 500mg  tablets, 2 tablets (1000mg ) by mouth in AM and 2 tabs (1000mg ) by mouth in PM, within 30 minutes of finishing meals, on days 1-14 of each 21 day cycle. Oxaliplatin will be infused on day 1 of each 21 day cycle. Xeloda and oxaliplatin start date: 05/12/17  Side effects of Xeloda include but not limited to: fatigue, decreased blood counts, GI upset, diarrhea, and hand-foot syndrome. Patient has loperamide at home and will call the office if diarrhea develops.    Reviewed with patient importance of keeping a medication schedule and plan for any missed doses.  Ms. Greis voiced understanding and appreciation.   All questions answered. Medication reconciliation performed and medication/allergy list updated.  Will follow up with patient regarding insurance and pharmacy.   Patient knows to call the office with questions or concerns. Oral Oncology Clinic will continue to follow.  Thank you,  Johny Drilling, PharmD, BCPS, BCOP 05/02/2017   12:19 PM Oral Oncology Clinic [phone]

## 2017-05-03 ENCOUNTER — Other Ambulatory Visit: Payer: Self-pay | Admitting: Nurse Practitioner

## 2017-05-03 DIAGNOSIS — D508 Other iron deficiency anemias: Secondary | ICD-10-CM

## 2017-05-03 MED ORDER — SODIUM CHLORIDE 0.9 % IV SOLN
510.0000 mg | Freq: Once | INTRAVENOUS | Status: DC
  Filled 2017-05-03: qty 17

## 2017-05-04 NOTE — Telephone Encounter (Signed)
Oral Oncology Patient Advocate Encounter  Prior Authorization for Xeloda has been approved under the patient's Part B benefit.      Effective dates: 05/04/2017 through 05/05/2019  Oral Oncology Clinic will continue to follow.   Fabio Asa. Melynda Keller, [place] Patient [place] [phone] 05/04/2017 9:11 AM

## 2017-05-05 MED FILL — CAPECITABINE 500 MG TABLET: 500 | 21 days supply | Qty: 56 | Fill #0

## 2017-05-07 ENCOUNTER — Telehealth: Payer: Self-pay | Admitting: Hematology

## 2017-05-07 NOTE — Telephone Encounter (Signed)
Spoke with patient re change in time for 11/8 appointments to 8:30 am. Per dtr called patient at [phone].

## 2017-05-09 ENCOUNTER — Ambulatory Visit (HOSPITAL_COMMUNITY)
Admission: RE | Admit: 2017-05-09 | Discharge: 2017-05-09 | Disposition: A | Discharge status: Home / Self Care | Payer: Medicare HMO | Source: Ambulatory Visit | Attending: Hematology | Admitting: Hematology

## 2017-05-09 ENCOUNTER — Other Ambulatory Visit: Payer: Self-pay | Admitting: Hematology

## 2017-05-09 DIAGNOSIS — C187 Malignant neoplasm of sigmoid colon: Secondary | ICD-10-CM | POA: Diagnosis not present

## 2017-05-09 DIAGNOSIS — K769 Liver disease, unspecified: Secondary | ICD-10-CM | POA: Insufficient documentation

## 2017-05-09 DIAGNOSIS — K7689 Other specified diseases of liver: Secondary | ICD-10-CM | POA: Diagnosis not present

## 2017-05-09 DIAGNOSIS — K8020 Calculus of gallbladder without cholecystitis without obstruction: Secondary | ICD-10-CM | POA: Diagnosis not present

## 2017-05-09 MED ORDER — GADOBENATE DIMEGLUMINE 529 MG/ML IV SOLN
10.0000 mL | Freq: Once | INTRAVENOUS | Status: AC | PRN
  Administered 2017-05-09: 9 mL via INTRAVENOUS

## 2017-05-12 ENCOUNTER — Ambulatory Visit (HOSPITAL_BASED_OUTPATIENT_CLINIC_OR_DEPARTMENT_OTHER): Payer: Medicare HMO

## 2017-05-12 ENCOUNTER — Ambulatory Visit: Payer: Medicare HMO

## 2017-05-12 ENCOUNTER — Telehealth: Payer: Self-pay | Admitting: Hematology

## 2017-05-12 ENCOUNTER — Other Ambulatory Visit (HOSPITAL_BASED_OUTPATIENT_CLINIC_OR_DEPARTMENT_OTHER): Payer: Medicare HMO

## 2017-05-12 ENCOUNTER — Other Ambulatory Visit: Payer: Medicare HMO

## 2017-05-12 ENCOUNTER — Encounter: Payer: Self-pay | Admitting: Nurse Practitioner

## 2017-05-12 ENCOUNTER — Ambulatory Visit (HOSPITAL_BASED_OUTPATIENT_CLINIC_OR_DEPARTMENT_OTHER): Payer: Medicare HMO | Admitting: Nurse Practitioner

## 2017-05-12 ENCOUNTER — Ambulatory Visit: Payer: Medicare HMO | Admitting: Nurse Practitioner

## 2017-05-12 VITALS — BP 120/81 | HR 91 | Temp 98.2°F | Resp 18 | Ht 64.0 in | Wt 99.4 lb

## 2017-05-12 DIAGNOSIS — D509 Iron deficiency anemia, unspecified: Secondary | ICD-10-CM | POA: Diagnosis not present

## 2017-05-12 DIAGNOSIS — R634 Abnormal weight loss: Secondary | ICD-10-CM | POA: Diagnosis not present

## 2017-05-12 DIAGNOSIS — D500 Iron deficiency anemia secondary to blood loss (chronic): Secondary | ICD-10-CM | POA: Diagnosis not present

## 2017-05-12 DIAGNOSIS — E876 Hypokalemia: Secondary | ICD-10-CM

## 2017-05-12 DIAGNOSIS — D62 Acute posthemorrhagic anemia: Secondary | ICD-10-CM

## 2017-05-12 DIAGNOSIS — C187 Malignant neoplasm of sigmoid colon: Secondary | ICD-10-CM

## 2017-05-12 DIAGNOSIS — Z5111 Encounter for antineoplastic chemotherapy: Secondary | ICD-10-CM | POA: Diagnosis not present

## 2017-05-12 LAB — COMPREHENSIVE METABOLIC PANEL
ALBUMIN: 4.5 g/dL (ref 3.5–5.0)
ALK PHOS: 68 U/L (ref 40–150)
ALT: 8 U/L (ref 0–55)
AST: 17 U/L (ref 5–34)
Anion Gap: 14 mEq/L — ABNORMAL HIGH (ref 3–11)
BUN: 13.4 mg/dL (ref 7.0–26.0)
CALCIUM: 9.7 mg/dL (ref 8.4–10.4)
CO2: 24 mEq/L (ref 22–29)
Chloride: 104 mEq/L (ref 98–109)
Creatinine: 1 mg/dL (ref 0.6–1.1)
EGFR: 55 mL/min/{1.73_m2} — ABNORMAL LOW (ref >60)
GLUCOSE: 114 mg/dL (ref 70–140)
POTASSIUM: 2.8 meq/L — AB (ref 3.5–5.1)
SODIUM: 142 meq/L (ref 136–145)
Total Bilirubin: 1.11 mg/dL (ref 0.20–1.20)
Total Protein: 7.6 g/dL (ref 6.4–8.3)

## 2017-05-12 LAB — CBC WITH DIFFERENTIAL/PLATELET
BASO%: 0.3 % (ref 0.0–2.0)
BASOS ABS: 0 10*3/uL (ref 0.0–0.1)
EOS ABS: 0.2 10*3/uL (ref 0.0–0.5)
EOS%: 5.1 % (ref 0.0–7.0)
HCT: 36.7 % (ref 34.8–46.6)
HEMOGLOBIN: 12.4 g/dL (ref 11.6–15.9)
LYMPH%: 22.5 % (ref 14.0–49.7)
MCH: 30.8 pg (ref 25.1–34.0)
MCHC: 33.8 g/dL (ref 31.5–36.0)
MCV: 91.1 fL (ref 79.5–101.0)
MONO#: 0.6 10*3/uL (ref 0.1–0.9)
MONO%: 15.4 % — AB (ref 0.0–14.0)
NEUT#: 2.3 10*3/uL (ref 1.5–6.5)
NEUT%: 56.7 % (ref 38.4–76.8)
Platelets: 193 10*3/uL (ref 145–400)
RBC: 4.03 10*6/uL (ref 3.70–5.45)
RDW: 13 % (ref 11.2–14.5)
WBC: 4 10*3/uL (ref 3.9–10.3)
lymph#: 0.9 10*3/uL (ref 0.9–3.3)

## 2017-05-12 MED ORDER — POTASSIUM CHLORIDE CRYS ER 20 MEQ PO TBCR: 20.0000 meq | EXTENDED_RELEASE_TABLET | Freq: Two times a day (BID) | ORAL | 1 refills | Status: DC

## 2017-05-12 MED ORDER — PALONOSETRON HCL INJECTION 0.25 MG/5ML
0.2500 mg | Freq: Once | INTRAVENOUS | Status: AC
  Administered 2017-05-12: 0.25 mg via INTRAVENOUS

## 2017-05-12 MED ORDER — SODIUM CHLORIDE 0.9 % IV SOLN
510.0000 mg | Freq: Once | INTRAVENOUS | Status: AC
  Administered 2017-05-12: 510 mg via INTRAVENOUS
  Filled 2017-05-12: qty 17

## 2017-05-12 MED ORDER — DEXTROSE 5 % IV SOLN
150.0000 mg | Freq: Once | INTRAVENOUS | Status: AC
  Administered 2017-05-12: 150 mg via INTRAVENOUS
  Filled 2017-05-12: qty 20

## 2017-05-12 MED ORDER — DEXTROSE 5 % IV SOLN
Freq: Once | INTRAVENOUS | Status: AC
  Administered 2017-05-12: 11:00:00 via INTRAVENOUS

## 2017-05-12 MED ORDER — DEXAMETHASONE SODIUM PHOSPHATE 10 MG/ML IJ SOLN
INTRAMUSCULAR | Status: AC
  Filled 2017-05-12: qty 1

## 2017-05-12 MED ORDER — PALONOSETRON HCL INJECTION 0.25 MG/5ML
INTRAVENOUS | Status: AC
  Filled 2017-05-12: qty 5

## 2017-05-12 MED ORDER — POTASSIUM CHLORIDE CRYS ER 20 MEQ PO TBCR
40.0000 meq | EXTENDED_RELEASE_TABLET | Freq: Once | ORAL | Status: DC
  Administered 2017-05-12: 40 meq via ORAL
  Filled 2017-05-12: qty 2

## 2017-05-12 MED ORDER — DEXAMETHASONE SODIUM PHOSPHATE 10 MG/ML IJ SOLN
10.0000 mg | Freq: Once | INTRAMUSCULAR | Status: AC
  Administered 2017-05-12: 10 mg via INTRAVENOUS

## 2017-05-12 NOTE — Addendum Note (Signed)
Addended by: Truitt Merle on: 05/12/2017 11:25 AM   Modules accepted: Orders

## 2017-05-12 NOTE — Progress Notes (Signed)
Nutrition Assessment  Reason for Assessment: RD contacted via phone for nutrition consult   ASSESSMENT:  Spoke with patient of Dr. Burr Medico during first chemotherapy infusion for colon cancer Patient is s/p laparoscopic sigmoid colectomy 04/07/17  Pt down to 99 lbs from 106 lbs 10/24, 6.7% weight loss in ~2 weeks, significant for time frame. This weight loss is concerning given chemotherapy was just initiated today.  Patient reports "trying her best" when it comes to increasing intake. 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 Patient denies any nutrition impact symptoms at this time except constipation, she tries to manage with adequate fluid intake and medication, last BM 2 days ago.    Patient reluctant to nutritional supplements however puts carnation instant breakfast in her "power shake" every morning. Pt consumes a shake in the morning that consists of yogurt, ice cream, carnation instant breakfast powder, 2% milk and seasonal fruit; 1/2 sandwich and 1/2 cup soup for lunch; for dinner they eat a meat and vegetable. Pt snacks throughout the day on fruit (example banana), [employer]/graham crackers with peanut butter, or egg salad. Promoted increased calorie and protein intake with small frequent meals and snacks.  Patient reports working in her garden and walking her dogs. Patient is looking forward to thanksgiving and eating leftover pumpkin pie.   Nutrition Focused Physical Exam: deferred  Medications: Protonix, Zofran, Compazine, ferrous sulfate, potassium chloride  Labs: Potassium 2.8 (low)  Anthropometrics:   Height: 5 foot 4 inches Weight: 99 lbs (45 kg)  UBW: 115 lbs  BMI: 17 kg/m^2  Estimated Energy Needs  Kcals: 1350-1575 Protein: 70-85 grams  Fluid: >/= 1.5 L/d  NUTRITION DIAGNOSIS: Inadequate oral intake related to cancer and cancer related treatments as evidenced by 6.7% weight loss in ~2 weeks and decreased  appetite  INTERVENTION:  - Promoted increased calorie and protein intake with small frequent meals and snacks.  - Emphasized the importance of weight maintenance - Encouraged use of nutritional supplements or increase consumption of carnation instant breakfast if weight continues to decrease - Questions answered. Teachback method used  MONITORING, EVALUATION, GOAL: Patient will consume adequate calories and protein to prevent further weight loss    NEXT VISIT: To be scheduled  Parks Ranger, MS, RDN, LDN 05/12/2017 2:29 PM

## 2017-05-12 NOTE — Progress Notes (Signed)
[facility]  Telephone:([phone]) [phone] [phone]  Clinic Follow up Note   Patient Care Team: Cyndi Bender, PA-C as PCP - General (Physician Assistant) [name], MD as Consulting Physician (General Surgery) Truitt Merle, MD as Consulting Physician (Hematology) Alla Feeling, NP as Nurse Practitioner (Nurse Practitioner) 05/12/2017  SUMMARY OF ONCOLOGIC HISTORY: Oncology History   Cancer Staging Cancer of sigmoid colon [facility]) Staging form: Colon and Rectum, AJCC 8th Edition - Pathologic stage from 04/07/2017: Stage IIIA (pT2, pN1b, cM0) - Signed by Truitt Merle, MD on 04/26/2017       Cancer of sigmoid colon ([place])   03/22/2017 Procedure    Colonoscopy per Dr. Marian Sorrow A rectal examination was performed and was normal. The endoscope was then inserted into the rectum and advanced under direct visualization to about 25 cm where a large, near obstructing mass was noted. This had a malignant appearance. It was biopsied. That area could not be traversed with the adult colonoscope. Small amount of bleeding was noted from the biopsy site, but this stopped right away. 7 mm pedunculated polyp was noted at 15 cm. This was removed using hot snare and retrieved for pathology. Ink was injected into the submucosa at 20 cm (5 cm distal to the mass). The endoscope was then withdrawn and the procedure terminated.   Findings / Comments: - Mass at 25 cm. Biopsied. - Polyp removed as above.       04/07/2017 Initial Diagnosis    Cancer of sigmoid colon ([place])     04/07/2017 Pathology Results    Diagnosis Colon, segmental resection for tumor, Sigmoid - INVASIVE COLORECTAL ADENOCARCINOMA, 2.5 CM. - CARCINOMA INVADES MUSCULARIS PROPRIA. - MARGINS NOT INVOLVED. - METASTATIC CARCINOMA IN TWO OF EIGHTEEN LYMPH NODES (2/18) - PROXIMAL AND DISTAL ANASTOMOTIC RINGS FREE OF TUMOR.  Specimen: Sigmoid colon with proximal and distal anastomotic rings. Procedure: Resection. Tumor site:  Sigmoid colon. Specimen integrity: Intact. Macroscopic intactness of mesorectum: N/A Macroscopic tumor perforation: No. Invasive tumor: Maximum size: 2.5 cm. Histologic type(s): Colorectal adenocarcinoma. Histologic grade and differentiation: G3 G1: well differentiated/low grade G2: moderately differentiated/low grade G3: poorly differentiated/high grade G4: undifferentiated/high grade Type of polyp in which invasive carcinoma arose: No residual polyp. Microscopic extension of invasive tumor: Into muscularis propria. Lymph-Vascular invasion: Present. Peri-neural invasion: Present. Tumor deposit(s) (discontinuous extramural extension): No.  Resection margins: Proximal margin: Free of tumor. Distal margin: Free of tumor. Circumferential (radial) (posterior ascending, posterior descending; lateral and posterior mid-rectum; and entire lower 1/3 rectum): N/A Mesenteric margin (sigmoid and transverse): Free of tumor. Distance closest margin (if all above margins negative): 6 cm from mesenteric margin. Treatment effect (neo-adjuvant therapy): No. Additional polyp(s): No. Non-neoplastic findings: N/A Lymph nodes: number examined - 18; number positive: 2 Pathologic Staging: pT2, pN1b, pMX Ancillary studies: MSI by PCR and MMR by IHC. (JDP:gt, 04/11/17)      04/07/2017 Surgery    Laparoscopic sigmoid colectomy per Dr. Barry Dienes      04/19/2017 Imaging    CT chest, abdomen and pelvis with contrast showed no definitive evidence of metastasis, numerous indeterminate subcentimeter hypodense lesions scattered throughout the liver, likely benign.      04/27/2017 Tumor Marker    CEA Pre-op 04/04/17: 1.4 Post-op 04/27/17: 1.27      05/09/2017 Imaging    MRI Abdomen IMPRESSION: 1. No evidence of metastatic colon cancer in the abdomen. 2. Multiple simple hepatic cysts. 3. Known cholelithiasis, not well visualized. No biliary dilatation.      CURRENT THERAPY: CAPOX beginning  05/12/17  INTERVAL HISTORY: Elizabeth Durham returns today for follow-up prior to cycle 1 Capox.  Elizabeth Durham has attended chemotherapy class and has received Xeloda supply.  Elizabeth Durham has lost weight since Elizabeth Durham last visit.  Elizabeth Durham is confused on what Elizabeth Durham can and cannot eat.  Constipation with BM every 2-3 days, occasionally requires Dulcolax.  Elizabeth Durham otherwise feels well but has concerns about side effects of chemotherapy.  REVIEW OF SYSTEMS:   Constitutional: Denies fatigue, fevers, chills (+) unintentional weight loss 7 pounds in 2 weeks Eyes: Denies blurriness of vision Ears, nose, mouth, throat, and face: Denies mucositis or sore throat Respiratory: Denies dyspnea or wheezes (+) intermittent dry cough  Cardiovascular: Denies palpitation, chest discomfort or lower extremity swelling Gastrointestinal:  Denies nausea, vomiting, diarrhea, heartburn or change in bowel habits. Denies blood in stool (+) Constipation, BM q2-3 days, PRN dulcolax (+) colon resection 04/07/17 Skin: Denies abnormal skin rashes Lymphatics: Denies new lymphadenopathy or easy bruising Neurological:Denies numbness, tingling or new weaknesses Behavioral/Psych: Mood is stable, no new changes  All other systems were reviewed with the patient and are negative.  MEDICAL HISTORY:  Past Medical History:  Diagnosis Date  . Essential hypertension   . GERD (gastroesophageal reflux disease)   . GI bleed   . High cholesterol   . Iron deficiency anemia     SURGICAL HISTORY: Past Surgical History:  Procedure Laterality Date  . COLONOSCOPY    . TONSILLECTOMY    . TOTAL HIP ARTHROPLASTY Right 2012    I have reviewed the social history and family history with the patient and they are unchanged from previous note.  ALLERGIES:  has No Known Allergies.  MEDICATIONS:  Current Outpatient Medications  Medication Sig Dispense Refill  . alendronate (FOSAMAX) 70 MG tablet Take 70 mg by mouth every 7 (seven) days.  11  . amLODipine (NORVASC) 10 MG tablet  Take 10 mg by mouth daily. for blood pressure  1  . atorvastatin (LIPITOR) 10 MG tablet Take 10 mg by mouth daily.  3  . capecitabine (XELODA) 500 MG tablet Take 2 tabs (1040m) in AM & 2 tab (10036m in PM, within 30 minutes of meals, take for 14 days on, 7 days off, repeat every 21 days 56 tablet 0  . ferrous sulfate 325 (65 FE) MG tablet Take 325 mg by mouth daily.  1  . pantoprazole (PROTONIX) 40 MG tablet Take 1 tablet (40 mg total) by mouth daily. 30 tablet 0  . ondansetron (ZOFRAN) 8 MG tablet Take 1 tablet (8 mg total) by mouth 2 (two) times daily as needed for refractory nausea / vomiting. Start on day 3 after chemotherapy. 30 tablet 1  . potassium chloride SA (K-DUR,KLOR-CON) 20 MEQ tablet Take 1 tablet (20 mEq total) 2 (two) times daily by mouth. 60 tablet 1  . prochlorperazine (COMPAZINE) 10 MG tablet Take 1 tablet (10 mg total) by mouth every 6 (six) hours as needed (Nausea or vomiting). 30 tablet 1   Current Facility-Administered Medications  Medication Dose Route Frequency Provider Last Rate Last Dose  . potassium chloride SA (K-DUR,KLOR-CON) CR tablet 40 mEq  40 mEq Oral Once BuAlla FeelingNP        PHYSICAL EXAMINATION: ECOG PERFORMANCE STATUS: 1 - Symptomatic but completely ambulatory  Vitals:   05/12/17 0818  BP: 120/81  Pulse: 91  Resp: 18  Temp: 98.2 F (36.8 C)  SpO2: 98%   Filed Weights   05/12/17 0818  Weight: 99 lb 6.4 oz (45.1 kg)  GENERAL:alert, no distress and comfortable SKIN: skin color, texture, turgor are normal, no rashes or significant lesions EYES: normal, Conjunctiva are pink and non-injected, sclera clear OROPHARYNX:no exudate, no erythema and lips, buccal mucosa, and tongue normal  NECK: supple, thyroid normal size, non-tender, without nodularity LYMPH:  no palpable cervical or supraclavicular lymphadenopathy  LUNGS: clear to auscultation bilaterally with normal breathing effort HEART: regular rate & rhythm and no murmurs and no lower  extremity edema ABDOMEN:abdomen soft, non-tender and normal bowel sounds. (+) 4 abdominal incisions are well-healed Musculoskeletal:no cyanosis of digits and no clubbing  NEURO: alert & oriented x 3 with fluent speech, no focal motor/sensory deficits  LABORATORY DATA:  I have reviewed the data as listed CBC Latest Ref Rng & Units 05/12/2017 04/27/2017 04/10/2017  WBC 3.9 - 10.3 10e3/uL 4.0 5.0 3.9(L)  Hemoglobin 11.6 - 15.9 g/dL 12.4 11.6 10.3(L)  Hematocrit 34.8 - 46.6 % 36.7 34.3(L) 30.6(L)  Platelets 145 - 400 10e3/uL 193 246 183     CMP Latest Ref Rng & Units 05/12/2017 04/27/2017 04/10/2017  Glucose 70 - 140 mg/dl 114 106 101(H)  BUN 7.0 - 26.0 mg/dL 13.4 14.0 <5(L)  Creatinine 0.6 - 1.1 mg/dL 1.0 1.2(H) 0.82  Sodium 136 - 145 mEq/L 142 142 138  Potassium 3.5 - 5.1 mEq/L 2.8(LL) 3.2(L) 3.7  Chloride 101 - 111 mmol/L - - 105  CO2 22 - 29 mEq/L _0 Calcium 8.4 - 10.4 mg/dL 9.7 9.7 8.7(L)  Total Protein 6.4 - 8.3 g/dL 7.6 8.0 -  Total Bilirubin 0.20 - 1.20 mg/dL 1.11 0.57 -  Alkaline Phos 40 - 150 U/L 68 90 -  AST 5 - 34 U/L 17 16 -  ALT 0 - 55 U/L 8 7 -    RADIOGRAPHIC STUDIES: I have personally reviewed the radiological images as listed and agreed with the findings in the report. No results found.   ASSESSMENT & PLAN: Elizabeth Durham is a 76 y.o. female with a history of GI bleed, anemia, HTN, and sigmoid colon cancer   1. Cancer of Sigmoid Colon, invasive adenocarcinoma, grade 3, pT2, pN1b, cM0, stage IIIA 2. Anemia, iron deficiency 3. Hypokalemia 4. Elevated creatinine 5. Weight loss 6. Hepatic cysts on MRI  Ms. Grieb appears stable today, recovered well from colon resection on 04/07/2017.  MRI of abdomen on 05/09/17 revealed multiple non-enhancing hepatic cysts but no evidence of metastatic colon cancer in the abdomen. I reviewed this with the patient. Elizabeth Durham has lost 7 pounds unintentionally since Elizabeth Durham last visit, Elizabeth Durham appetite fluctuates and Elizabeth Durham does not like the taste  of nutrition supplements.  Elizabeth Durham feels Elizabeth Durham diet has been restricted since attending the chemo class last week.  I have encouraged Elizabeth Durham to eat whatever Elizabeth Durham wants in order to gain weight, I placed dietary referral today and spoke with dietitian, Elizabeth Durham will see Elizabeth Durham in infusion today.  For constipation Elizabeth Durham will continue Dulcolax as needed to maintain BM every 1-2 days. Elizabeth Durham is hypokalemic, K 2.8.  Elizabeth Durham reports Elizabeth Durham is taking potassium at home but is not on Elizabeth Durham med list.  Elizabeth Durham will receive 40 mEq oral K x1 in infusion today, I sent a prescription to begin to 20 mEq BID starting tomorrow.  Hgb normalized to 12.4, but iron studies from 10/24 indicate iron deficiency; Elizabeth Durham will receive IV Feraheme x1 in infusion room today. Elizabeth Durham has Xeloda at home but did not take morning dose today, Elizabeth Durham will take p.m. dose then resume BID for 14 days  on, 7 days off.  Elizabeth Durham creatinine is improved today, 0.8, but has been elevated in the past. Elizabeth Durham will receive reduced dose oxaliplatin and Xeloda with cycle 1 due to elevated creatinine and weight loss.  Elizabeth Durham will return in 1 week for toxicity follow-up, if Elizabeth Durham tolerates 1st cycle well will increase dose with second cycle.  Reviewed potential side effects, symptom management, and symptoms that would require Elizabeth Durham to call clinic, Elizabeth Durham verbalizes understanding. Elizabeth Durham will pick up imodium at pharmacy, already has anti-emetics. Elizabeth Durham refused flu vaccine today.  PLAN: -OK to proceed with cycle 1 CAPOX today; Xeloda 1000 mg twice daily x14 days on, 7 days off -IV Feraheme x1 in infusion room today -K 2.8 today, will receive 40 mEq oral K once today while in infusion, will begin 20 mEq BID starting 05/13/17 -Dietician referral placed today, will see patient in infusion today -Dulcolax as needed to maintain BM every 1-2 days -Lab and f/u with NP in 1 week to monitor toxicities, 3 weeks for cycle 2  Orders Placed This Encounter  Procedures  . Ambulatory referral to Nutrition and Diabetic E    Referral Priority:    Routine    Referral Type:   Consultation    Referral Reason:   Specialty Services Required    Number of Visits Requested:   1   All questions were answered. The patient knows to call the clinic with any problems, questions or concerns. No barriers to learning was detected. I spent 25 minutes counseling the patient face to face. The total time spent in the appointment was 30 minutes and more than 50% was on counseling and review of test results, and coordination of care.      Alla Feeling, NP 05/12/17

## 2017-05-12 NOTE — Patient Instructions (Addendum)
[facility] Discharge Instructions for Patients Receiving Chemotherapy  Today you received the following chemotherapy agents:  Oxaliplatin  To help prevent nausea and vomiting after your treatment, we encourage you to take your nausea medication as prescribed.   If you develop nausea and vomiting that is not controlled by your nausea medication, call the clinic.   BELOW ARE SYMPTOMS THAT SHOULD BE REPORTED IMMEDIATELY:  *FEVER GREATER THAN 100.5 F  *CHILLS WITH OR WITHOUT FEVER  NAUSEA AND VOMITING THAT IS NOT CONTROLLED WITH YOUR NAUSEA MEDICATION  *UNUSUAL SHORTNESS OF BREATH  *UNUSUAL BRUISING OR BLEEDING  TENDERNESS IN MOUTH AND THROAT WITH OR WITHOUT PRESENCE OF ULCERS  *URINARY PROBLEMS  *BOWEL PROBLEMS  UNUSUAL RASH Items with * indicate a potential emergency and should be followed up as soon as possible.  Feel free to call the clinic should you have any questions or concerns. The clinic phone number is [phone].  Please show the [place] at check-in to the Emergency Department and triage nurse.     Ferumoxytol injection What is this medicine? FERUMOXYTOL is an iron complex. Iron is used to make healthy red blood cells, which carry oxygen and nutrients throughout the body. This medicine is used to treat iron deficiency anemia in people with chronic kidney disease. This medicine may be used for other purposes; ask your health care provider or pharmacist if you have questions. COMMON BRAND NAME(S): Feraheme What should I tell my health care provider before I take this medicine? They need to know if you have any of these conditions: -anemia not caused by low iron levels -high levels of iron in the blood -magnetic resonance imaging (MRI) test scheduled -an unusual or allergic reaction to iron, other medicines, foods, dyes, or preservatives -pregnant or trying to get pregnant -breast-feeding How should I use this medicine? This medicine  is for injection into a vein. It is given by a health care professional in a hospital or clinic setting. Talk to your pediatrician regarding the use of this medicine in children. Special care may be needed. Overdosage: If you think you have taken too much of this medicine contact a poison control center or emergency room at once. NOTE: This medicine is only for you. Do not share this medicine with others. What if I miss a dose? It is important not to miss your dose. Call your doctor or health care professional if you are unable to keep an appointment. What may interact with this medicine? This medicine may interact with the following medications: -other iron products This list may not describe all possible interactions. Give your health care provider a list of all the medicines, herbs, non-prescription drugs, or dietary supplements you use. Also tell them if you smoke, drink alcohol, or use illegal drugs. Some items may interact with your medicine. What should I watch for while using this medicine? Visit your doctor or healthcare professional regularly. Tell your doctor or healthcare professional if your symptoms do not start to get better or if they get worse. You may need blood work done while you are taking this medicine. You may need to follow a special diet. Talk to your doctor. Foods that contain iron include: whole grains/cereals, dried fruits, beans, or peas, leafy green vegetables, and organ meats (liver, kidney). What side effects may I notice from receiving this medicine? Side effects that you should report to your doctor or health care professional as soon as possible: -allergic reactions like skin rash, itching or hives,  swelling of the face, lips, or tongue -breathing problems -changes in blood pressure -feeling faint or lightheaded, falls -fever or chills -flushing, sweating, or hot feelings -swelling of the ankles or feet Side effects that usually do not require medical  attention (report to your doctor or health care professional if they continue or are bothersome): -diarrhea -headache -nausea, vomiting -stomach pain This list may not describe all possible side effects. Call your doctor for medical advice about side effects. You may report side effects to FDA at 1-800-FDA-1088. Where should I keep my medicine? This drug is given in a hospital or clinic and will not be stored at home. NOTE: This sheet is a summary. It may not cover all possible information. If you have questions about this medicine, talk to your doctor, pharmacist, or health care provider.  2018 Elsevier/Gold Standard (2015-07-24 12:41:49)    Oxaliplatin Injection What is this medicine? OXALIPLATIN (ox AL i PLA tin) is a chemotherapy drug. It targets fast dividing cells, like cancer cells, and causes these cells to die. This medicine is used to treat cancers of the colon and rectum, and many other cancers. This medicine may be used for other purposes; ask your health care provider or pharmacist if you have questions. COMMON BRAND NAME(S): Eloxatin What should I tell my health care provider before I take this medicine? They need to know if you have any of these conditions: -kidney disease -an unusual or allergic reaction to oxaliplatin, other chemotherapy, other medicines, foods, dyes, or preservatives -pregnant or trying to get pregnant -breast-feeding How should I use this medicine? This drug is given as an infusion into a vein. It is administered in a hospital or clinic by a specially trained health care professional. Talk to your pediatrician regarding the use of this medicine in children. Special care may be needed. Overdosage: If you think you have taken too much of this medicine contact a poison control center or emergency room at once. NOTE: This medicine is only for you. Do not share this medicine with others. What if I miss a dose? It is important not to miss a dose. Call your  doctor or health care professional if you are unable to keep an appointment. What may interact with this medicine? -medicines to increase blood counts like filgrastim, pegfilgrastim, sargramostim -probenecid -some antibiotics like amikacin, gentamicin, neomycin, polymyxin B, streptomycin, tobramycin -zalcitabine Talk to your doctor or health care professional before taking any of these medicines: -acetaminophen -aspirin -ibuprofen -ketoprofen -naproxen This list may not describe all possible interactions. Give your health care provider a list of all the medicines, herbs, non-prescription drugs, or dietary supplements you use. Also tell them if you smoke, drink alcohol, or use illegal drugs. Some items may interact with your medicine. What should I watch for while using this medicine? Your condition will be monitored carefully while you are receiving this medicine. You will need important blood work done while you are taking this medicine. This medicine can make you more sensitive to cold. Do not drink cold drinks or use ice. Cover exposed skin before coming in contact with cold temperatures or cold objects. When out in cold weather wear warm clothing and cover your mouth and nose to warm the air that goes into your lungs. Tell your doctor if you get sensitive to the cold. This drug may make you feel generally unwell. This is not uncommon, as chemotherapy can affect healthy cells as well as cancer cells. Report any side effects. Continue your course of  treatment even though you feel ill unless your doctor tells you to stop. In some cases, you may be given additional medicines to help with side effects. Follow all directions for their use. Call your doctor or health care professional for advice if you get a fever, chills or sore throat, or other symptoms of a cold or flu. Do not treat yourself. This drug decreases your body's ability to fight infections. Try to avoid being around people who are  sick. This medicine may increase your risk to bruise or bleed. Call your doctor or health care professional if you notice any unusual bleeding. Be careful brushing and flossing your teeth or using a toothpick because you may get an infection or bleed more easily. If you have any dental work done, tell your dentist you are receiving this medicine. Avoid taking products that contain aspirin, acetaminophen, ibuprofen, naproxen, or ketoprofen unless instructed by your doctor. These medicines may hide a fever. Do not become pregnant while taking this medicine. Women should inform their doctor if they wish to become pregnant or think they might be pregnant. There is a potential for serious side effects to an unborn child. Talk to your health care professional or pharmacist for more information. Do not breast-feed an infant while taking this medicine. Call your doctor or health care professional if you get diarrhea. Do not treat yourself. What side effects may I notice from receiving this medicine? Side effects that you should report to your doctor or health care professional as soon as possible: -allergic reactions like skin rash, itching or hives, swelling of the face, lips, or tongue -low blood counts - This drug may decrease the number of white blood cells, red blood cells and platelets. You may be at increased risk for infections and bleeding. -signs of infection - fever or chills, cough, sore throat, pain or difficulty passing urine -signs of decreased platelets or bleeding - bruising, pinpoint red spots on the skin, black, tarry stools, nosebleeds -signs of decreased red blood cells - unusually weak or tired, fainting spells, lightheadedness -breathing problems -chest pain, pressure -cough -diarrhea -jaw tightness -mouth sores -nausea and vomiting -pain, swelling, redness or irritation at the injection site -pain, tingling, numbness in the hands or feet -problems with balance, talking,  walking -redness, blistering, peeling or loosening of the skin, including inside the mouth -trouble passing urine or change in the amount of urine Side effects that usually do not require medical attention (report to your doctor or health care professional if they continue or are bothersome): -changes in vision -constipation -hair loss -loss of appetite -metallic taste in the mouth or changes in taste -stomach pain This list may not describe all possible side effects. Call your doctor for medical advice about side effects. You may report side effects to FDA at 1-800-FDA-1088. Where should I keep my medicine? This drug is given in a hospital or clinic and will not be stored at home. NOTE: This sheet is a summary. It may not cover all possible information. If you have questions about this medicine, talk to your doctor, pharmacist, or health care provider.  2018 Elsevier/Gold Standard (2008-01-16 17:22:47)

## 2017-05-12 NOTE — Telephone Encounter (Signed)
Gave avs and calendar for November and December  °

## 2017-05-16 NOTE — Progress Notes (Signed)
  Oncology Nurse Navigator Documentation  Navigator Location: [facility] (05/16/17 1435)   )Navigator Encounter Type: Telephone (05/16/17 1435) Telephone: Outgoing Call (05/16/17 1435)                 Treatment Initiated Date: 05/12/17 (05/16/17 1435)   Treatment Phase: Treatment (05/16/17 1435) Barriers/Navigation Needs: Education (05/16/17 1435) Education: Coping with Diagnosis/ Prognosis (05/16/17 1435) Interventions: Psycho-social support (05/16/17 1435)  I called patient to see how she tolerated her Oxaliplatin on 05/12/17. Patient stated "it was rough." Patient shared that it has been hard not being able to drink cold water with her pills. Patient stated that other then being tired and sensitive to cold she has been "OK." I encouraged patient to call with questions or concerns.   Education Method: Verbal (05/16/17 1435)      Acuity: Level 1 (05/16/17 1435) Acuity Level 1: Minimal follow up required (05/16/17 1435)       Time Spent with Patient: 15 (05/16/17 1435)

## 2017-05-19 ENCOUNTER — Ambulatory Visit (HOSPITAL_BASED_OUTPATIENT_CLINIC_OR_DEPARTMENT_OTHER): Payer: Medicare HMO | Admitting: Nurse Practitioner

## 2017-05-19 ENCOUNTER — Other Ambulatory Visit (HOSPITAL_BASED_OUTPATIENT_CLINIC_OR_DEPARTMENT_OTHER): Payer: Medicare HMO

## 2017-05-19 ENCOUNTER — Encounter: Payer: Self-pay | Admitting: Nurse Practitioner

## 2017-05-19 ENCOUNTER — Ambulatory Visit (HOSPITAL_BASED_OUTPATIENT_CLINIC_OR_DEPARTMENT_OTHER): Payer: Medicare HMO

## 2017-05-19 ENCOUNTER — Other Ambulatory Visit: Payer: Self-pay | Admitting: Emergency Medicine

## 2017-05-19 VITALS — BP 125/81 | HR 63 | Temp 98.1°F | Resp 18 | Ht 64.0 in | Wt 91.1 lb

## 2017-05-19 DIAGNOSIS — R634 Abnormal weight loss: Secondary | ICD-10-CM

## 2017-05-19 DIAGNOSIS — C187 Malignant neoplasm of sigmoid colon: Secondary | ICD-10-CM

## 2017-05-19 DIAGNOSIS — D509 Iron deficiency anemia, unspecified: Secondary | ICD-10-CM | POA: Diagnosis not present

## 2017-05-19 LAB — COMPREHENSIVE METABOLIC PANEL
ALT: 28 U/L (ref 0–55)
AST: 27 U/L (ref 5–34)
Albumin: 4.6 g/dL (ref 3.5–5.0)
Alkaline Phosphatase: 58 U/L (ref 40–150)
Anion Gap: 11 mEq/L (ref 3–11)
BUN: 33.9 mg/dL — AB (ref 7.0–26.0)
CHLORIDE: 107 meq/L (ref 98–109)
CO2: 29 meq/L (ref 22–29)
CREATININE: 1.2 mg/dL — AB (ref 0.6–1.1)
Calcium: 10.5 mg/dL — ABNORMAL HIGH (ref 8.4–10.4)
EGFR: 45 mL/min/{1.73_m2} — ABNORMAL LOW (ref >60)
Glucose: 145 mg/dl — ABNORMAL HIGH (ref 70–140)
POTASSIUM: 3.8 meq/L (ref 3.5–5.1)
SODIUM: 146 meq/L — AB (ref 136–145)
Total Bilirubin: 1.3 mg/dL — ABNORMAL HIGH (ref 0.20–1.20)
Total Protein: 7.8 g/dL (ref 6.4–8.3)

## 2017-05-19 LAB — IRON AND TIBC
%SAT: 81 % — AB (ref 21–57)
Iron: 229 ug/dL — ABNORMAL HIGH (ref 41–142)
TIBC: 284 ug/dL (ref 236–444)
UIBC: 54 ug/dL — AB (ref 120–384)

## 2017-05-19 LAB — CEA (IN HOUSE-CHCC): CEA (CHCC-In House): 1.35 ng/mL (ref 0.00–5.00)

## 2017-05-19 LAB — CBC WITH DIFFERENTIAL/PLATELET
BASO%: 0.2 % (ref 0.0–2.0)
Basophils Absolute: 0 10*3/uL (ref 0.0–0.1)
EOS ABS: 0.1 10*3/uL (ref 0.0–0.5)
EOS%: 2.3 % (ref 0.0–7.0)
HEMATOCRIT: 36.6 % (ref 34.8–46.6)
HEMOGLOBIN: 12.3 g/dL (ref 11.6–15.9)
LYMPH#: 0.7 10*3/uL — AB (ref 0.9–3.3)
LYMPH%: 15.3 % (ref 14.0–49.7)
MCH: 31 pg (ref 25.1–34.0)
MCHC: 33.6 g/dL (ref 31.5–36.0)
MCV: 92.2 fL (ref 79.5–101.0)
MONO#: 0.8 10*3/uL (ref 0.1–0.9)
MONO%: 16.2 % — ABNORMAL HIGH (ref 0.0–14.0)
NEUT%: 66 % (ref 38.4–76.8)
NEUTROS ABS: 3.1 10*3/uL (ref 1.5–6.5)
PLATELETS: 183 10*3/uL (ref 145–400)
RBC: 3.97 10*6/uL (ref 3.70–5.45)
RDW: 12.7 % (ref 11.2–14.5)
WBC: 4.7 10*3/uL (ref 3.9–10.3)

## 2017-05-19 LAB — FERRITIN

## 2017-05-19 MED ORDER — SODIUM CHLORIDE 0.9% FLUSH
10.0000 mL | INTRAVENOUS | Status: DC | PRN
  Administered 2017-05-19: 10 mL
  Filled 2017-05-19: qty 10

## 2017-05-19 MED ORDER — SODIUM CHLORIDE 0.9 % IV SOLN
Freq: Once | INTRAVENOUS | Status: DC
Start: 2017-05-19 — End: 2017-05-19
  Administered 2017-05-19: 13:00:00 via INTRAVENOUS

## 2017-05-19 NOTE — Progress Notes (Addendum)
[facility]  [phone] [phone]  Clinic Follow up Note   Patient Care Team: Cyndi Bender, PA-C as PCP - General (Physician Assistant) [name], MD as Consulting Physician (General Surgery) Truitt Merle, MD as Consulting Physician (Hematology) Alla Feeling, NP as Nurse Practitioner (Nurse Practitioner) 05/19/2017  SUMMARY OF ONCOLOGIC HISTORY: Oncology History   Cancer Staging Cancer of sigmoid colon [facility]) Staging form: Colon and Rectum, AJCC 8th Edition - Pathologic stage from 04/07/2017: Stage IIIA (pT2, pN1b, cM0) - Signed by Truitt Merle, MD on 04/26/2017       Cancer of sigmoid colon ([place])   03/22/2017 Procedure    Colonoscopy per Dr. Marian Sorrow A rectal examination was performed and was normal. The endoscope was then inserted into the rectum and advanced under direct visualization to about 25 cm where a large, near obstructing mass was noted. This had a malignant appearance. It was biopsied. That area could not be traversed with the adult colonoscope. Small amount of bleeding was noted from the biopsy site, but this stopped right away. 7 mm pedunculated polyp was noted at 15 cm. This was removed using hot snare and retrieved for pathology. Ink was injected into the submucosa at 20 cm (5 cm distal to the mass). The endoscope was then withdrawn and the procedure terminated.   Findings / Comments: - Mass at 25 cm. Biopsied. - Polyp removed as above.       04/07/2017 Initial Diagnosis    Cancer of sigmoid colon ([place])      04/07/2017 Pathology Results    Diagnosis Colon, segmental resection for tumor, Sigmoid - INVASIVE COLORECTAL ADENOCARCINOMA, 2.5 CM. - CARCINOMA INVADES MUSCULARIS PROPRIA. - MARGINS NOT INVOLVED. - METASTATIC CARCINOMA IN TWO OF EIGHTEEN LYMPH NODES (2/18) - PROXIMAL AND DISTAL ANASTOMOTIC RINGS FREE OF TUMOR.  Specimen: Sigmoid colon with proximal and distal anastomotic rings. Procedure: Resection. Tumor site:  Sigmoid colon. Specimen integrity: Intact. Macroscopic intactness of mesorectum: N/A Macroscopic tumor perforation: No. Invasive tumor: Maximum size: 2.5 cm. Histologic type(s): Colorectal adenocarcinoma. Histologic grade and differentiation: G3 G1: well differentiated/low grade G2: moderately differentiated/low grade G3: poorly differentiated/high grade G4: undifferentiated/high grade Type of polyp in which invasive carcinoma arose: No residual polyp. Microscopic extension of invasive tumor: Into muscularis propria. Lymph-Vascular invasion: Present. Peri-neural invasion: Present. Tumor deposit(s) (discontinuous extramural extension): No.  Resection margins: Proximal margin: Free of tumor. Distal margin: Free of tumor. Circumferential (radial) (posterior ascending, posterior descending; lateral and posterior mid-rectum; and entire lower 1/3 rectum): N/A Mesenteric margin (sigmoid and transverse): Free of tumor. Distance closest margin (if all above margins negative): 6 cm from mesenteric margin. Treatment effect (neo-adjuvant therapy): No. Additional polyp(s): No. Non-neoplastic findings: N/A Lymph nodes: number examined - 18; number positive: 2 Pathologic Staging: pT2, pN1b, pMX Ancillary studies: MSI by PCR and MMR by IHC. (JDP:gt, 04/11/17)      04/07/2017 Surgery    Laparoscopic sigmoid colectomy per Dr. Barry Dienes      04/19/2017 Imaging    CT chest, abdomen and pelvis with contrast showed no definitive evidence of metastasis, numerous indeterminate subcentimeter hypodense lesions scattered throughout the liver, likely benign.      04/27/2017 Tumor Marker    CEA Pre-op 04/04/17: 1.4 Post-op 04/27/17: 1.27      05/09/2017 Imaging    MRI Abdomen IMPRESSION: 1. No evidence of metastatic colon cancer in the abdomen. 2. Multiple simple hepatic cysts. 3. Known cholelithiasis, not well visualized. No biliary dilatation.      CURRENT THERAPY: CAPOX  beginning 05/12/17  (Oxaliplatin 130 mg/m2; Xeloda 500 mg 2 tablets BID x14 days, 7 days off)  INTERVAL HISTORY: Ms. Caison returns for follow-up as scheduled following cycle 1 Capox on 05/12/2017.  Cold sensitivity lasting 1 week with decreased taste, decreased p.o. intake, 8 pounds weight loss, and fatigue.  She felt confused on what she could eat because of differing guidelines from surgery and chemo education class.  She prefers cold liquids and due to oxaliplatin could not drink this, warm/room temperature liquids make her gag.  She feels she could not eat adequately because she could not drink liquids with meals.  Denies foot redness or pain, mouth sores, or diarrhea.  REVIEW OF SYSTEMS:   Constitutional: Denies fevers, chills (+) 8 lbs weight loss in 1 week (+) fatigue (+) decreased taste Eyes: Denies blurriness of vision Ears, nose, mouth, throat, and face: Denies mucositis or sore throat (+) dry mouth Respiratory: Denies cough, dyspnea or wheezes Cardiovascular: Denies palpitation, chest discomfort or lower extremity swelling Gastrointestinal:  Denies nausea, vomiting, diarrhea, heartburn or change in bowel habits (+) constipation at baseline, BM 2 days ago Skin: Denies abnormal skin rashes, hand or foot redness or pain Lymphatics: Denies new lymphadenopathy or easy bruising Neurological:Denies numbness, tingling or new weaknesses Behavioral/Psych: Mood is stable, no new changes  All other systems were reviewed with the patient and are negative.  MEDICAL HISTORY:  Past Medical History:  Diagnosis Date  . Essential hypertension   . GERD (gastroesophageal reflux disease)   . GI bleed   . High cholesterol   . Iron deficiency anemia     SURGICAL HISTORY: Past Surgical History:  Procedure Laterality Date  . COLONOSCOPY    . LAPAROSCOPIC SIGMOID COLECTOMY N/A 04/07/2017   Procedure: LAPAROSCOPIC SIGMOID COLECTOMY, ERAS PATHWAY, splenic flexure takedown;  Surgeon: [name], MD;  Location: [facility];   Service: General;  Laterality: N/A;  . TONSILLECTOMY    . TOTAL HIP ARTHROPLASTY Right 2012    I have reviewed the social history and family history with the patient and they are unchanged from previous note.  ALLERGIES:  has No Known Allergies.  MEDICATIONS:  Current Outpatient Medications  Medication Sig Dispense Refill  . alendronate (FOSAMAX) 70 MG tablet Take 70 mg by mouth every 7 (seven) days.  11  . amLODipine (NORVASC) 10 MG tablet Take 10 mg by mouth daily. for blood pressure  1  . atorvastatin (LIPITOR) 10 MG tablet Take 10 mg by mouth daily.  3  . capecitabine (XELODA) 500 MG tablet Take 2 tabs (1073m) in AM & 2 tab (10070m in PM, within 30 minutes of meals, take for 14 days on, 7 days off, repeat every 21 days 56 tablet 0  . ferrous sulfate 325 (65 FE) MG tablet Take 325 mg by mouth daily.  1  . ondansetron (ZOFRAN) 8 MG tablet Take 1 tablet (8 mg total) by mouth 2 (two) times daily as needed for refractory nausea / vomiting. Start on day 3 after chemotherapy. 30 tablet 1  . pantoprazole (PROTONIX) 40 MG tablet Take 1 tablet (40 mg total) by mouth daily. 30 tablet 0  . potassium chloride SA (K-DUR,KLOR-CON) 20 MEQ tablet Take 1 tablet (20 mEq total) 2 (two) times daily by mouth. 60 tablet 1  . prochlorperazine (COMPAZINE) 10 MG tablet Take 1 tablet (10 mg total) by mouth every 6 (six) hours as needed (Nausea or vomiting). (Patient not taking: Reported on 05/19/2017) 30 tablet 1   Current Facility-Administered Medications  Medication Dose Route Frequency Provider Last Rate Last Dose  . sodium chloride flush (NS) 0.9 % injection 10 mL  10 mL Intracatheter PRN Truitt Merle, MD        PHYSICAL EXAMINATION: ECOG PERFORMANCE STATUS: 3 - Symptomatic, >50% confined to bed  Vitals:   05/19/17 1155  BP: 125/81  Pulse: 63  Resp: 18  Temp: 98.1 F (36.7 C)  SpO2: 97%   Filed Weights   05/19/17 1155  Weight: 91 lb 1.6 oz (41.3 kg)    GENERAL:alert, no distress and  comfortable SKIN: skin color, texture are normal, no rashes, significant lesions, or palmar-plantar erythema (+) decreased turgor EYES: normal, Conjunctiva are pink and non-injected, sclera clear OROPHARYNX:no exudate, no erythema and lips, buccal mucosa, and tongue normal (+) dry mucous membranes NECK: supple, thyroid normal size, non-tender, without nodularity LYMPH:  no palpable cervical or supraclavicular lymphadenopathy  LUNGS: clear to auscultation laterally with normal breathing effort HEART: regular rate & rhythm and no murmurs and no lower extremity edema ABDOMEN:abdomen soft, non-tender and normal bowel sounds Musculoskeletal:no cyanosis of digits and no clubbing  NEURO: alert & oriented x 3 with fluent speech, no focal motor/sensory deficits  LABORATORY DATA:  I have reviewed the data as listed CBC Latest Ref Rng & Units 05/19/2017 05/12/2017 04/27/2017  WBC 3.9 - 10.3 10e3/uL 4.7 4.0 5.0  Hemoglobin 11.6 - 15.9 g/dL 12.3 12.4 11.6  Hematocrit 34.8 - 46.6 % 36.6 36.7 34.3(L)  Platelets 145 - 400 10e3/uL 183 193 246     CMP Latest Ref Rng & Units 05/19/2017 05/12/2017 04/27/2017  Glucose 70 - 140 mg/dl 145(H) 114 106  BUN 7.0 - 26.0 mg/dL 33.9(H) 13.4 14.0  Creatinine 0.6 - 1.1 mg/dL 1.2(H) 1.0 1.2(H)  Sodium 136 - 145 mEq/L 146(H) 142 142  Potassium 3.5 - 5.1 mEq/L 3.8 2.8(LL) 3.2(L)  Chloride 101 - 111 mmol/L - - -  CO2 22 - 29 mEq/L '29 24 22  ' Calcium 8.4 - 10.4 mg/dL 10.5(H) 9.7 9.7  Total Protein 6.4 - 8.3 g/dL 7.8 7.6 8.0  Total Bilirubin 0.20 - 1.20 mg/dL 1.30(H) 1.11 0.57  Alkaline Phos 40 - 150 U/L 58 68 90  AST 5 - 34 U/L '27 17 16  ' ALT 0 - 55 U/L '28 8 7    ' RADIOGRAPHIC STUDIES: I have personally reviewed the radiological images as listed and agreed with the findings in the report. No results found.   ASSESSMENT & PLAN: AMENDA DUCLOS S16 y.o.female with a history of GI bleed, anemia, HTN, and sigmoid colon cancer  1. Cancer ofSigmoid  Colon,invasive adenocarcinoma, grade 3,pT2, pN1b, cM0, stage IIIA 2. Anemia, iron deficiency 3. Hypokalemia 4. Elevated creatinine 5. Weight loss, anorexia 6. Hepatic cysts on MRI 7. Elevated creatinine, bilirubin 8. Dehydration   Ms. Caccamo tolerated chemo poorly with anorexia and dehydration. Weight is down 8 pounds in 1 week. She has clinical and laboratory evidence of dehydration with dry mucous membranes, decreased skin turgor, and mildly elevated creatinine (1.2) and calcium (10.5).  Mild hyperbilirubinemia (1.3) likely secondary to chemotherapy. She will get 1 L IVF in clinic today. She is on day 7 of xeloda, 1000 mg BID x14 days/7 days off; she will stop Xeloda today. She will return in 1 week for evaluation, next cycle due to begin 06/02/17. If she improves and gains weight, will consider restarting chemotherapy at reduced dose or change therapy to xeloda only.   I reached out to dietician to request close f/u until her  weight is stable. The patient did not eat solid food all week, only popsicles once cold sensitivity resolved. She does not tolerate boost/ensure supplements. I encouraged her to eat whatever she wants to gain weight.  She received IV Feraheme on 11/8 for iron deficiency anemia, serum iron responded to 229, Sat 81%, ferritin 1064. Anemia is resolved, Hgb 12.3 today. Will continue to monitor.  Plan: Stop xeloda today 1 L IVF over 2 hours today Return in 1 week for f/u with APP and IVF Return in 2 weeks for f/u and potential chemo if she improves and gains weight; reduced dose vs xeloda only  All questions were answered. The patient knows to call the clinic with any problems, questions or concerns. No barriers to learning was detected.     Alla Feeling, NP 05/19/17   Addendum I have seen the patient, examined her. I agree with the assessment and and plan and have edited the notes.   Ms Lumb tolerated the first cycle CAPOX poorly, she has lost significant weight,  fatigued with poor appetite.  We discussed diet and hydration, will give IV fluids today.  I doubt that she can continue full dose CAPOX, she is not willing to change to FOLFOX due to transportation issue.  Will likely change to Xeloda alone from next cycle.  Truitt Merle  05/19/2017

## 2017-05-19 NOTE — Patient Instructions (Signed)
Dehydration, Adult Dehydration is when there is not enough fluid or water in your body. This happens when you lose more fluids than you take in. Dehydration can range from mild to very bad. It should be treated right away to keep it from getting very bad. Symptoms of mild dehydration may include:  Thirst.  Dry lips.  Slightly dry mouth.  Dry, warm skin.  Dizziness. Symptoms of moderate dehydration may include:  Very dry mouth.  Muscle cramps.  Dark pee (urine). Pee may be the color of tea.  Your body making less pee.  Your eyes making fewer tears.  Heartbeat that is uneven or faster than normal (palpitations).  Headache.  Light-headedness, especially when you stand up from sitting.  Fainting (syncope). Symptoms of very bad dehydration may include:  Changes in skin, such as: ? Cold and clammy skin. ? Blotchy (mottled) or pale skin. ? Skin that does not quickly return to normal after being lightly pinched and let go (poor skin turgor).  Changes in body fluids, such as: ? Feeling very thirsty. ? Your eyes making fewer tears. ? Not sweating when body temperature is high, such as in hot weather. ? Your body making very little pee.  Changes in vital signs, such as: ? Weak pulse. ? Pulse that is more than 100 beats a minute when you are sitting still. ? Fast breathing. ? Low blood pressure.  Other changes, such as: ? Sunken eyes. ? Cold hands and feet. ? Confusion. ? Lack of energy (lethargy). ? Trouble waking up from sleep. ? Short-term weight loss. ? Unconsciousness. Follow these instructions at home:  If told by your doctor, drink an ORS: ? Make an ORS by using instructions on the package. ? Start by drinking small amounts, about  cup (120 mL) every 5-10 minutes. ? Slowly drink more until you have had the amount that your doctor said to have.  Drink enough clear fluid to keep your pee clear or pale yellow. If you were told to drink an ORS, finish the ORS  first, then start slowly drinking clear fluids. Drink fluids such as: ? Water. Do not drink only water by itself. Doing that can make the salt (sodium) level in your body get too low (hyponatremia). ? Ice chips. ? Fruit juice that you have added water to (diluted). ? Low-calorie sports drinks.  Avoid: ? Alcohol. ? Drinks that have a lot of sugar. These include high-calorie sports drinks, fruit juice that does not have water added, and soda. ? Caffeine. ? Foods that are greasy or have a lot of fat or sugar.  Take over-the-counter and prescription medicines only as told by your doctor.  Do not take salt tablets. Doing that can make the salt level in your body get too high (hypernatremia).  Eat foods that have minerals (electrolytes). Examples include bananas, oranges, potatoes, tomatoes, and spinach.  Keep all follow-up visits as told by your doctor. This is important. Contact a doctor if:  You have belly (abdominal) pain that: ? Gets worse. ? Stays in one area (localizes).  You have a rash.  You have a stiff neck.  You get angry or annoyed more easily than normal (irritability).  You are more sleepy than normal.  You have a harder time waking up than normal.  You feel: ? Weak. ? Dizzy. ? Very thirsty.  You have peed (urinated) only a small amount of very dark pee during 6-8 hours. Get help right away if:  You have symptoms of   very bad dehydration.  You cannot drink fluids without throwing up (vomiting).  Your symptoms get worse with treatment.  You have a fever.  You have a very bad headache.  You are throwing up or having watery poop (diarrhea) and it: ? Gets worse. ? Does not go away.  You have blood or something green (bile) in your throw-up.  You have blood in your poop (stool). This may cause poop to look black and tarry.  You have not peed in 6-8 hours.  You pass out (faint).  Your heart rate when you are sitting still is more than 100 beats a  minute.  You have trouble breathing. This information is not intended to replace advice given to you by your health care provider. Make sure you discuss any questions you have with your health care provider. Document Released: 04/17/2009 Document Revised: 01/09/2016 Document Reviewed: 08/15/2015 Elsevier Interactive Patient Education  2018 [employer] Inc[employer]  

## 2017-05-20 ENCOUNTER — Telehealth: Payer: Self-pay | Admitting: Hematology

## 2017-05-20 NOTE — Telephone Encounter (Signed)
Gave patient AVS and calendar of upcoming November appointments per 11/15 los.

## 2017-05-23 ENCOUNTER — Telehealth: Payer: Self-pay | Admitting: *Deleted

## 2017-05-23 ENCOUNTER — Telehealth: Payer: Self-pay | Admitting: Emergency Medicine

## 2017-05-23 NOTE — Telephone Encounter (Signed)
PT cancelled appointments for tomorrow. States she is eating and drinking much better and can not make it in tomorrow for fluids. Pt states she will be here for her appts on 11/29. Lacie NP made aware of this. Scheduling message sent to cancel appts. Pt verbalized understanding to call clinic for any future issues.

## 2017-05-23 NOTE — Telephone Encounter (Signed)
Pt called and left message for Lacie, NP re:  Pt will need to cancel office visit for 05/24/17.  Stated she is eating and drinking ok now.  Pt will keep appts for  11/29 as scheduled.

## 2017-05-24 ENCOUNTER — Ambulatory Visit: Payer: Medicare HMO | Admitting: Nurse Practitioner

## 2017-05-24 ENCOUNTER — Encounter (HOSPITAL_COMMUNITY): Payer: Medicare HMO

## 2017-05-24 ENCOUNTER — Other Ambulatory Visit: Payer: Medicare HMO

## 2017-06-02 ENCOUNTER — Ambulatory Visit: Payer: Medicare HMO

## 2017-06-02 ENCOUNTER — Ambulatory Visit (HOSPITAL_BASED_OUTPATIENT_CLINIC_OR_DEPARTMENT_OTHER): Payer: Medicare HMO | Admitting: Hematology

## 2017-06-02 ENCOUNTER — Encounter: Payer: Self-pay | Admitting: Hematology

## 2017-06-02 ENCOUNTER — Encounter: Payer: Medicare HMO | Admitting: Nutrition

## 2017-06-02 ENCOUNTER — Other Ambulatory Visit (HOSPITAL_BASED_OUTPATIENT_CLINIC_OR_DEPARTMENT_OTHER): Payer: Medicare HMO

## 2017-06-02 ENCOUNTER — Other Ambulatory Visit: Payer: Self-pay | Admitting: *Deleted

## 2017-06-02 VITALS — BP 130/77 | HR 73 | Temp 98.3°F | Resp 18 | Ht 64.0 in | Wt 97.8 lb

## 2017-06-02 DIAGNOSIS — E876 Hypokalemia: Secondary | ICD-10-CM

## 2017-06-02 DIAGNOSIS — D509 Iron deficiency anemia, unspecified: Secondary | ICD-10-CM | POA: Diagnosis not present

## 2017-06-02 DIAGNOSIS — C187 Malignant neoplasm of sigmoid colon: Secondary | ICD-10-CM

## 2017-06-02 DIAGNOSIS — D500 Iron deficiency anemia secondary to blood loss (chronic): Secondary | ICD-10-CM

## 2017-06-02 DIAGNOSIS — I10 Essential (primary) hypertension: Secondary | ICD-10-CM

## 2017-06-02 DIAGNOSIS — R634 Abnormal weight loss: Secondary | ICD-10-CM | POA: Diagnosis not present

## 2017-06-02 LAB — CBC WITH DIFFERENTIAL/PLATELET
BASO%: 0.3 % (ref 0.0–2.0)
Basophils Absolute: 0 10*3/uL (ref 0.0–0.1)
EOS%: 1.9 % (ref 0.0–7.0)
Eosinophils Absolute: 0.1 10*3/uL (ref 0.0–0.5)
HCT: 32.7 % — ABNORMAL LOW (ref 34.8–46.6)
HEMOGLOBIN: 11.2 g/dL — AB (ref 11.6–15.9)
LYMPH#: 0.7 10*3/uL — AB (ref 0.9–3.3)
LYMPH%: 22.5 % (ref 14.0–49.7)
MCH: 31.3 pg (ref 25.1–34.0)
MCHC: 34.3 g/dL (ref 31.5–36.0)
MCV: 91.3 fL (ref 79.5–101.0)
MONO#: 0.5 10*3/uL (ref 0.1–0.9)
MONO%: 15.9 % — ABNORMAL HIGH (ref 0.0–14.0)
NEUT%: 59.4 % (ref 38.4–76.8)
NEUTROS ABS: 1.9 10*3/uL (ref 1.5–6.5)
PLATELETS: 160 10*3/uL (ref 145–400)
RBC: 3.58 10*6/uL — AB (ref 3.70–5.45)
RDW: 13.4 % (ref 11.2–14.5)
WBC: 3.2 10*3/uL — AB (ref 3.9–10.3)

## 2017-06-02 LAB — COMPREHENSIVE METABOLIC PANEL
ALBUMIN: 3.9 g/dL (ref 3.5–5.0)
ALT: 13 U/L (ref 0–55)
ANION GAP: 9 meq/L (ref 3–11)
AST: 18 U/L (ref 5–34)
Alkaline Phosphatase: 57 U/L (ref 40–150)
BUN: 14.3 mg/dL (ref 7.0–26.0)
CO2: 25 meq/L (ref 22–29)
Calcium: 9.4 mg/dL (ref 8.4–10.4)
Chloride: 107 mEq/L (ref 98–109)
Creatinine: 1 mg/dL (ref 0.6–1.1)
EGFR: 55 mL/min/{1.73_m2} — ABNORMAL LOW (ref >60)
GLUCOSE: 93 mg/dL (ref 70–140)
Potassium: 3.9 mEq/L (ref 3.5–5.1)
SODIUM: 140 meq/L (ref 136–145)
TOTAL PROTEIN: 6.7 g/dL (ref 6.4–8.3)
Total Bilirubin: 0.61 mg/dL (ref 0.20–1.20)

## 2017-06-02 MED ORDER — CAPECITABINE 500 MG PO TABS: ORAL_TABLET | ORAL | 0 refills | Status: DC

## 2017-06-02 NOTE — Progress Notes (Signed)
[facility]  Telephone:([phone]) [phone] Fax:([phone]) [phone]  Clinic Follow up Note   Patient Care Team: Cyndi Bender, PA-C as PCP - General (Physician Assistant) [name], MD as Consulting Physician (General Surgery) Truitt Merle, MD as Consulting Physician (Hematology) Alla Feeling, NP as Nurse Practitioner (Nurse Practitioner) 06/02/2017   CHIEF COMPLAINTS:  Follow up cancer of Sigmoid Colon   Oncology History   Cancer Staging Cancer of sigmoid colon [facility]) Staging form: Colon and Rectum, AJCC 8th Edition - Pathologic stage from 04/07/2017: Stage IIIA (pT2, pN1b, cM0) - Signed by Truitt Merle, MD on 04/26/2017       Cancer of sigmoid colon ([place])   03/22/2017 Procedure    Colonoscopy per Dr. Marian Sorrow A rectal examination was performed and was normal. The endoscope was then inserted into the rectum and advanced under direct visualization to about 25 cm where a large, near obstructing mass was noted. This had a malignant appearance. It was biopsied. That area could not be traversed with the adult colonoscope. Small amount of bleeding was noted from the biopsy site, but this stopped right away. 7 mm pedunculated polyp was noted at 15 cm. This was removed using hot snare and retrieved for pathology. Ink was injected into the submucosa at 20 cm (5 cm distal to the mass). The endoscope was then withdrawn and the procedure terminated.   Findings / Comments: - Mass at 25 cm. Biopsied. - Polyp removed as above.       04/07/2017 Initial Diagnosis    Cancer of sigmoid colon ([place])      04/07/2017 Pathology Results    Diagnosis Colon, segmental resection for tumor, Sigmoid - INVASIVE COLORECTAL ADENOCARCINOMA, 2.5 CM. - CARCINOMA INVADES MUSCULARIS PROPRIA. - MARGINS NOT INVOLVED. - METASTATIC CARCINOMA IN TWO OF EIGHTEEN LYMPH NODES (2/18) - PROXIMAL AND DISTAL ANASTOMOTIC RINGS FREE OF TUMOR.  Specimen: Sigmoid colon with proximal and distal anastomotic  rings. Procedure: Resection. Tumor site: Sigmoid colon. Specimen integrity: Intact. Macroscopic intactness of mesorectum: N/A Macroscopic tumor perforation: No. Invasive tumor: Maximum size: 2.5 cm. Histologic type(s): Colorectal adenocarcinoma. Histologic grade and differentiation: G3 G1: well differentiated/low grade G2: moderately differentiated/low grade G3: poorly differentiated/high grade G4: undifferentiated/high grade Type of polyp in which invasive carcinoma arose: No residual polyp. Microscopic extension of invasive tumor: Into muscularis propria. Lymph-Vascular invasion: Present. Peri-neural invasion: Present. Tumor deposit(s) (discontinuous extramural extension): No.  Resection margins: Proximal margin: Free of tumor. Distal margin: Free of tumor. Circumferential (radial) (posterior ascending, posterior descending; lateral and posterior mid-rectum; and entire lower 1/3 rectum): N/A Mesenteric margin (sigmoid and transverse): Free of tumor. Distance closest margin (if all above margins negative): 6 cm from mesenteric margin. Treatment effect (neo-adjuvant therapy): No. Additional polyp(s): No. Non-neoplastic findings: N/A Lymph nodes: number examined - 18; number positive: 2 Pathologic Staging: pT2, pN1b, pMX Ancillary studies: MSI by PCR and MMR by IHC. (JDP:gt, 04/11/17)      04/07/2017 Surgery    Laparoscopic sigmoid colectomy per Dr. Barry Dienes      04/19/2017 Imaging    CT chest, abdomen and pelvis with contrast showed no definitive evidence of metastasis, numerous indeterminate subcentimeter hypodense lesions scattered throughout the liver, likely benign.      04/27/2017 Tumor Marker    CEA Pre-op 04/04/17: 1.4 Post-op 04/27/17: 1.27      05/09/2017 Imaging    MRI Abdomen IMPRESSION: 1. No evidence of metastatic colon cancer in the abdomen. 2. Multiple simple hepatic cysts. 3. Known cholelithiasis, not well visualized. No biliary dilatation.  05/12/2017 -  Chemotherapy     CAPOX Xeloda 1062m BID, 2 weeks on 1 week off, oxliplatin every 3 weeks. Starting 05/12/17. Xeloda held on 05/19/17 due to poor toleration. Proceed with single agent Xeloda 10073mBID starting with Cycle 2 on 06/04/17.         HISTORY OF PRESENTING ILLNESS: 04/27/17  Elizabeth Guile89.o. female is here because of newly diagnosed colon cancer. She presents to the clinic today accompanied by her daughter. She was referred by Dr. ByBarry DienesIn 12/2015 the [facility] and acute blood loss anemia, she was also found to be iron deficient. Anemia improved with blood transfusion and she began taking oral iron supplementation. A colonoscopy was scheduled but ultimately cancelled because she could not tolerate prep. It was not rescheduled. She reports chronic constipation with intermittent blood on tissue after hard BM.  She previously lost weight but has gained it back. Colonoscopy was eventually rescheduled and performed on 03/22/2017 by Dr. BiMarian Sorrowo follow up on GI bleeding and because she had never had one. Colonoscopy revealed a malignant appearing near obstructing mass at about 25 cm, and a pedunculated polyp at 15 cm; both were biopsied. Colon mass pathology revealed scant fragments of intramucosal adenocarcinoma arising in an adenomatous polyp with high grade dysplasia; polyp biopsy revealed tubulovillous adeno suspicious for high grade dysplasia. She was referred to Dr. ByBarry Dienesor segmental resection on 04/07/17, pathology revealed invasive adenocarcinoma 2.5cm that invades the muscularis propria, margins were negative, with metastatic disease in 2 of 18 lymph nodes.   Today she is recovering well from surgery. She has good appetite. Continues to have constipation, has not used medication; last BM 1 day ago. She denies pain, nausea, vomiting, blood in stool, or fatigue.  Patient is not currently working. She walks around the block and does  yard work for exercise. She performs ADL's independently, lives with her brother and daughter; she is without a car, transportation is unreliable. Family has to borrow friend's car for rides.    CURRENT THERAPY: CAPOX q3weeks with Xeloda 100042mID, 2 weeks on 1 week off. Starting 05/12/17. Xeloda held on 05/19/17 due to poor toleration. Proceed with single agent Xeloda 1000m66mD starting with Cycle 2 on 06/04/17.    INTERVAL THERAPY:  Elizabeth DANOhere for a follow up. She presents to the clinic today accompanied by her daughter. She notes she started feeling much better a few days after stopping Xeloda, she even gained weight back. She denies swelling of legs. She felt sick while taking the pills. She had loss of appetite and did not eat which caused her to feel weak. She did not vomit nor experience nausea.     REVIEW OF SYSTEMS:   Constitutional: Denies fatigue, fevers, chills or abnormal night sweats (+) weight gain  Eyes: Denies blurriness of vision, double vision or watery eyes Ears, nose, mouth, throat, and face: Denies mucositis or sore throat Respiratory: Denies cough, dyspnea or wheezes Cardiovascular: Denies palpitation, chest discomfort  Gastrointestinal:  Denies nausea, vomiting, diarrhea, heartburn, blood in stool, abdominal pain, or change in bowel habits  Skin: Denies abnormal skin rashes Lymphatics: Denies new lymphadenopathy or easy bruising Neurological:Denies numbness, tingling or new weaknesses Behavioral/Psych: Mood is stable, no new changes  All other systems were reviewed with the patient and are negative.   MEDICAL HISTORY:  Past Medical History:  Diagnosis Date  . Essential hypertension   . GERD (gastroesophageal reflux  disease)   . GI bleed   . High cholesterol   . Iron deficiency anemia     SURGICAL HISTORY: Past Surgical History:  Procedure Laterality Date  . COLONOSCOPY    . LAPAROSCOPIC SIGMOID COLECTOMY N/A 04/07/2017   Procedure:  LAPAROSCOPIC SIGMOID COLECTOMY, ERAS PATHWAY, splenic flexure takedown;  Surgeon: [name], MD;  Location: [place];  Service: General;  Laterality: N/A;  . TONSILLECTOMY    . TOTAL HIP ARTHROPLASTY Right 2012    SOCIAL HISTORY: Social History   Socioeconomic History  . Marital status: Widowed    Spouse name: Not on file  . Number of children: Not on file  . Years of education: Not on file  . Highest education level: Not on file  Social Needs  . Financial resource strain: Not on file  . Food insecurity - worry: Not on file  . Food insecurity - inability: Not on file  . Transportation needs - medical: Not on file  . Transportation needs - non-medical: Not on file  Occupational History  . Not on file  Tobacco Use  . Smoking status: Former Smoker    Packs/day: 0.50    Years: 7.00    Pack years: 3.50    Types: Cigarettes    Last attempt to quit: 1983    Years since quitting: 35.9  . Smokeless tobacco: Never Used  Substance and Sexual Activity  . Alcohol use: No    Alcohol/week: 0.0 oz  . Drug use: No  . Sexual activity: Not on file  Other Topics Concern  . Not on file  Social History Narrative  . Not on file    FAMILY HISTORY: Family History  Problem Relation Age of Onset  . Leukemia Mother   . Cancer Father        unknown type  Negative family history for GI cancer; her father had unknown type of cancer   ALLERGIES:  has No Known Allergies.  MEDICATIONS:  Current Outpatient Medications  Medication Sig Dispense Refill  . alendronate (FOSAMAX) 70 MG tablet Take 70 mg by mouth every 7 (seven) days.  11  . amLODipine (NORVASC) 10 MG tablet Take 10 mg by mouth daily. for blood pressure  1  . atorvastatin (LIPITOR) 10 MG tablet Take 10 mg by mouth daily.  3  . capecitabine (XELODA) 500 MG tablet Take 2 tabs (1031m) in AM & 2 tab (10024m in PM, within 30 minutes of meals, take for 14 days on, 7 days off, repeat every 21 days 56 tablet 0  . ferrous sulfate 325 (65  FE) MG tablet Take 325 mg by mouth daily.  1  . ondansetron (ZOFRAN) 8 MG tablet Take 1 tablet (8 mg total) by mouth 2 (two) times daily as needed for refractory nausea / vomiting. Start on day 3 after chemotherapy. 30 tablet 1  . pantoprazole (PROTONIX) 40 MG tablet Take 1 tablet (40 mg total) by mouth daily. 30 tablet 0  . potassium chloride SA (K-DUR,KLOR-CON) 20 MEQ tablet Take 1 tablet (20 mEq total) 2 (two) times daily by mouth. 60 tablet 1  . prochlorperazine (COMPAZINE) 10 MG tablet Take 1 tablet (10 mg total) by mouth every 6 (six) hours as needed (Nausea or vomiting). (Patient not taking: Reported on 05/19/2017) 30 tablet 1   No current facility-administered medications for this visit.     PHYSICAL EXAMINATION: ECOG PERFORMANCE STATUS: 1 - Symptomatic but completely ambulatory  Vitals:   06/02/17 1255  BP: 130/77  Pulse: 73  Resp: 18  Temp: 98.3 F (36.8 C)  SpO2: 99%   Filed Weights   06/02/17 1255  Weight: 97 lb 12.8 oz (44.4 kg)     GENERAL:alert, no distress and comfortable SKIN: skin color, texture, turgor are normal, no rashes or significant lesions EYES: normal, conjunctiva are pink and non-injected, sclera clear OROPHARYNX:no exudate, no erythema and lips, buccal mucosa, and tongue normal  NECK: supple, thyroid normal size, non-tender, without nodularity LYMPH:  no palpable cervical, axillary, or inguinal lymphadenopathy  LUNGS: clear to auscultation bilaterally with normal breathing effort HEART: regular rate & rhythm, no murmurs; trace bilateral lower extremity edema ABDOMEN:abdomen soft, non-tender and normal bowel sounds. No palpable hepatomegaly. 4 abdominal incisions and midline incision covered with steri strips, healing well, no erythema or drainage Musculoskeletal:no cyanosis of digits and no clubbing  PSYCH: alert & oriented x 3 with fluent speech NEURO: no focal motor/sensory deficits  LABORATORY DATA:  I have reviewed the data as listed CBC  Latest Ref Rng & Units 06/02/2017 05/19/2017 05/12/2017  WBC 3.9 - 10.3 10e3/uL 3.2(L) 4.7 4.0  Hemoglobin 11.6 - 15.9 g/dL 11.2(L) 12.3 12.4  Hematocrit 34.8 - 46.6 % 32.7(L) 36.6 36.7  Platelets 145 - 400 10e3/uL 160 183 193    CMP Latest Ref Rng & Units 06/02/2017 05/19/2017 05/12/2017  Glucose 70 - 140 mg/dl 93 145(H) 114  BUN 7.0 - 26.0 mg/dL 14.3 33.9(H) 13.4  Creatinine 0.6 - 1.1 mg/dL 1.0 1.2(H) 1.0  Sodium 136 - 145 mEq/L 140 146(H) 142  Potassium 3.5 - 5.1 mEq/L 3.9 3.8 2.8(LL)  Chloride 101 - 111 mmol/L - - -  CO2 22 - 29 mEq/L '25 29 24  ' Calcium 8.4 - 10.4 mg/dL 9.4 10.5(H) 9.7  Total Protein 6.4 - 8.3 g/dL 6.7 7.8 7.6  Total Bilirubin 0.20 - 1.20 mg/dL 0.61 1.30(H) 1.11  Alkaline Phos 40 - 150 U/L 57 58 68  AST 5 - 34 U/L '18 27 17  ' ALT 0 - 55 U/L '13 28 8    ' RADIOGRAPHIC STUDIES: I have personally reviewed the radiological images as listed and agreed with the findings in the report. Mr Abdomen W Wo Contrast  Result Date: 05/09/2017 CLINICAL DATA:  Sigmoid colon cancer status post laparoscopic colectomy 04/07/2017. Indeterminate liver lesions on CT. EXAM: MRI ABDOMEN WITHOUT AND WITH CONTRAST TECHNIQUE: Multiplanar multisequence MR imaging of the abdomen was performed both before and after the administration of intravenous contrast. CONTRAST:  46m MULTIHANCE GADOBENATE DIMEGLUMINE 529 MG/ML IV SOLN COMPARISON:  CT 04/19/2017. FINDINGS: Lower chest:  The visualized lower chest appears unremarkable. Hepatobiliary: As demonstrated on CT, there are numerous hepatic cysts. The largest cysts measure 1.7 x 1.3 cm in the left lobe (image 15 of series 3) and 22 x 16 mm in the right lobe (image 21). There are numerous smaller lesions. No enhancing or otherwise worrisome hepatic findings. Small gallstones are better seen on previous CT. No evidence of gallbladder wall thickening or biliary dilatation. Pancreas: Unremarkable. No pancreatic ductal dilatation or surrounding inflammatory changes.  Spleen: Normal in size without focal abnormality. Adrenals/Urinary Tract: Both adrenal glands appear normal. 2.8 cm cyst in the upper pole the left kidney is noted. There are additional tiny cysts bilaterally. No evidence of enhancing renal mass or hydronephrosis. Stomach/Bowel: No evidence of bowel wall thickening, distention or surrounding inflammatory change. Vascular/Lymphatic: There are no enlarged abdominal lymph nodes. No acute vascular findings. Other: No ascites or peritoneal nodularity. Musculoskeletal: No acute or significant osseous findings. There are mild  degenerative changes within the spine. There are multiple perineural cysts throughout the thoracolumbar spine. IMPRESSION: 1. No evidence of metastatic colon cancer in the abdomen. 2. Multiple simple hepatic cysts. 3. Known cholelithiasis, not well visualized. No biliary dilatation. Electronically Signed   By: Richardean Sale M.D.   On: 05/09/2017 13:43    ASSESSMENT & PLAN:  Elizabeth Durham is a 76 y.o. female with a history of GI bleed, anemia, HTN, and sigmoid colon cancer    1. Cancer of Sigmoid Colon, invasive adenocarcinoma, grade 3, pT2, pN1b, cM0, stage IIIA -I discussed her colonoscopy, imaging, and surgical pathology findings in detail with the patient and family -Staging CT CAP shows no definite metastatic disease but does show numerous indeterminate sub-cm hypodense lesions scattered throughout the liver, too small to characterize but likely benign --MRI of abdomen on 05/09/17 revealed multiple non-enhancing hepatic cysts but no evidence of metastatic colon cancer in the abdomen -I reviewed her moderate risk for cancer recurrence after surgery -adjuvant chemotherapy is recommended, which is the standard care for stage III colon cancer. She is 75, but overall healthy and fit, no major medical comorbidities, has good performance status. Would be a candidate for chemo.  I discussed the option of FOLFOX q2 weeks vs CAPOX q3 weeks  with Xeloda daily BID x2 weeks with 1 week off; the patient is interested in pursuing chemotherapy -I suspect she would tolerate FOLFOX better overall, but due to her transportation issues and lack of a car, she prefers CAPOX on q3 week schedule.  -she has started adjuvant CAPOX, reduced Xeloda to 1017m BID, 2 weeks on and 1 week off and oxaliplatin on 05/12/17 for cycle 1 due to elevated Cr and weight loss -She tolerated cycel 1 poorly with anorexia and dehydration, she lost 8 pounds in 1 weeks.  -she has recovered well from cycle 1 chemo  -due to her poor tolerance to chemo, will hold oxaliplatin, and continue Xeloda single agent. She will start cycle 2 Xeloda on 06/04/17. -I suggest she uses a pill box or use a calender to help her keep up with her medication. -I encouraged her to contact the clinic if she develops significant side effects again.  -Labs reviewed, adequate to start cycle 2 on 12/1  -F/u on 12/20   2. Anemia, iron deficiency  -she has been iron deficient in the past, on oral ferrous sulfate 1 tab daily -She received IV Feraheme on 11/8 for iron deficiency anemia, serum iron responded to 229, Sat 81%, ferritin 1064. Her Hg recovered at 12.3 on 11/15  -HG slightly lowered to 11.2 today (06/02/17)   3. Hypokalemia -she takes oral K 10 mEq PRN with lasix  -K 3.2 on 04/27/17, I recommend she take 20 mEq daily, increased to BID on 05/12/17   4. Weight loss, anorexia  - I have encouraged her to eat whatever she wants in order to gain weight, I placed dietary referral -Worsened with chemotherapy. Dietician will do close follow up until weight is stable.  -Gained some weight back since holding chemo.    PLAN: -Will proceed cycle 2 chemo with Xeloda only, 10069mbid for 14 days, then off 7 days. Will hold Oxaliplatin due to poor tolerance  -Refill Xeloda, start cycle 2 on 06/04/17  -Lab and f/u on 06/23/17. If she tolerates cycle 2 well, will consider increase Xeloda to 150087mid  or 1500m68m and 1000mg28mfrom cycle 3, depends on her renal function    No orders of  the defined types were placed in this encounter.   All questions were answered. The patient knows to call the clinic with any problems, questions or concerns.   Truitt Merle  06/02/2017    This document serves as a record of services personally performed by Truitt Merle, MD. It was created on her behalf by Joslyn Devon, a trained medical scribe. The creation of this record is based on the scribe's personal observations and the provider's statements to them.     I have reviewed the above documentation for accuracy and completeness, and I agree with the above.

## 2017-06-08 MED FILL — CAPECITABINE 500 MG TABLET: 500 | 21 days supply | Qty: 56 | Fill #0

## 2017-06-10 DIAGNOSIS — R636 Underweight: Secondary | ICD-10-CM | POA: Diagnosis not present

## 2017-06-10 DIAGNOSIS — E785 Hyperlipidemia, unspecified: Secondary | ICD-10-CM | POA: Diagnosis not present

## 2017-06-10 DIAGNOSIS — C187 Malignant neoplasm of sigmoid colon: Secondary | ICD-10-CM | POA: Diagnosis not present

## 2017-06-10 DIAGNOSIS — D509 Iron deficiency anemia, unspecified: Secondary | ICD-10-CM | POA: Diagnosis not present

## 2017-06-10 DIAGNOSIS — I10 Essential (primary) hypertension: Secondary | ICD-10-CM | POA: Diagnosis not present

## 2017-06-22 ENCOUNTER — Other Ambulatory Visit: Payer: Self-pay | Admitting: Nurse Practitioner

## 2017-06-23 ENCOUNTER — Encounter: Payer: Self-pay | Admitting: Nurse Practitioner

## 2017-06-23 ENCOUNTER — Other Ambulatory Visit (HOSPITAL_BASED_OUTPATIENT_CLINIC_OR_DEPARTMENT_OTHER): Payer: Medicare HMO

## 2017-06-23 ENCOUNTER — Encounter: Payer: Medicare HMO | Admitting: Nutrition

## 2017-06-23 ENCOUNTER — Ambulatory Visit (HOSPITAL_BASED_OUTPATIENT_CLINIC_OR_DEPARTMENT_OTHER): Payer: Medicare HMO | Admitting: Nurse Practitioner

## 2017-06-23 ENCOUNTER — Ambulatory Visit: Payer: Medicare HMO

## 2017-06-23 ENCOUNTER — Telehealth: Payer: Self-pay | Admitting: Nurse Practitioner

## 2017-06-23 DIAGNOSIS — C187 Malignant neoplasm of sigmoid colon: Secondary | ICD-10-CM | POA: Diagnosis not present

## 2017-06-23 LAB — CBC WITH DIFFERENTIAL/PLATELET
BASO%: 0 % (ref 0.0–2.0)
BASOS ABS: 0 10*3/uL (ref 0.0–0.1)
EOS%: 2 % (ref 0.0–7.0)
Eosinophils Absolute: 0.1 10*3/uL (ref 0.0–0.5)
HCT: 32.5 % — ABNORMAL LOW (ref 34.8–46.6)
HGB: 11.1 g/dL — ABNORMAL LOW (ref 11.6–15.9)
LYMPH%: 19.5 % (ref 14.0–49.7)
MCH: 32.2 pg (ref 25.1–34.0)
MCHC: 34.2 g/dL (ref 31.5–36.0)
MCV: 94.2 fL (ref 79.5–101.0)
MONO#: 0.6 10*3/uL (ref 0.1–0.9)
MONO%: 19.5 % — AB (ref 0.0–14.0)
NEUT#: 1.7 10*3/uL (ref 1.5–6.5)
NEUT%: 59 % (ref 38.4–76.8)
Platelets: 188 10*3/uL (ref 145–400)
RBC: 3.45 10*6/uL — AB (ref 3.70–5.45)
RDW: 15.6 % — ABNORMAL HIGH (ref 11.2–14.5)
WBC: 2.9 10*3/uL — ABNORMAL LOW (ref 3.9–10.3)
lymph#: 0.6 10*3/uL — ABNORMAL LOW (ref 0.9–3.3)

## 2017-06-23 LAB — COMPREHENSIVE METABOLIC PANEL
ALT: 12 U/L (ref 0–55)
AST: 20 U/L (ref 5–34)
Albumin: 4.2 g/dL (ref 3.5–5.0)
Alkaline Phosphatase: 70 U/L (ref 40–150)
Anion Gap: 10 mEq/L (ref 3–11)
BUN: 19.9 mg/dL (ref 7.0–26.0)
CALCIUM: 9.8 mg/dL (ref 8.4–10.4)
CHLORIDE: 107 meq/L (ref 98–109)
CO2: 23 meq/L (ref 22–29)
Creatinine: 1 mg/dL (ref 0.6–1.1)
EGFR: 59 mL/min/{1.73_m2} — ABNORMAL LOW (ref >60)
GLUCOSE: 93 mg/dL (ref 70–140)
POTASSIUM: 3.7 meq/L (ref 3.5–5.1)
SODIUM: 140 meq/L (ref 136–145)
Total Bilirubin: 0.56 mg/dL (ref 0.20–1.20)
Total Protein: 7 g/dL (ref 6.4–8.3)

## 2017-06-23 LAB — IRON AND TIBC
%SAT: 33 % (ref 21–57)
IRON: 108 ug/dL (ref 41–142)
TIBC: 325 ug/dL (ref 236–444)
UIBC: 217 ug/dL (ref 120–384)

## 2017-06-23 LAB — CEA (IN HOUSE-CHCC): CEA (CHCC-IN HOUSE): 1.31 ng/mL (ref 0.00–5.00)

## 2017-06-23 LAB — FERRITIN: Ferritin: 317 ng/ml — ABNORMAL HIGH (ref 9–269)

## 2017-06-23 MED ORDER — CAPECITABINE 500 MG PO TABS: ORAL_TABLET | ORAL | 0 refills | Status: DC

## 2017-06-23 MED FILL — CAPECITABINE 500 MG TABLET: 500 | 21 days supply | Qty: 84 | Fill #0

## 2017-06-23 NOTE — Progress Notes (Signed)
Elizabeth Durham  [phone] [phone]  Clinic Follow up Note   Patient Care Team: Elizabeth Bender, PA-C as PCP - General (Physician Assistant) Elizabeth Klein, MD as Consulting Physician (General Surgery) Elizabeth Merle, MD as Consulting Physician (Hematology) Elizabeth Feeling, NP as Nurse Practitioner (Nurse Practitioner) 06/23/2017  CHIEF COMPLAINT: F/u cancer of sigmoid colon   SUMMARY OF ONCOLOGIC HISTORY: Oncology History   Cancer Staging Cancer of sigmoid colon [facility] - Closed) Staging form: Colon and Rectum, AJCC 8th Edition - Pathologic stage from 04/07/2017: Stage IIIA (pT2, pN1b, cM0) - Signed by Elizabeth Merle, MD on 04/26/2017       Cancer of sigmoid colon ([place])   03/22/2017 Procedure    Colonoscopy per Dr. Marian Durham A rectal examination was performed and was normal. The endoscope was then inserted into the rectum and advanced under direct visualization to about 25 cm where a large, near obstructing mass was noted. This had a malignant appearance. It was biopsied. That area could not be traversed with the adult colonoscope. Small amount of bleeding was noted from the biopsy site, but this stopped right away. 7 mm pedunculated polyp was noted at 15 cm. This was removed using hot snare and retrieved for pathology. Ink was injected into the submucosa at 20 cm (5 cm distal to the mass). The endoscope was then withdrawn and the procedure terminated.   Findings / Comments: - Mass at 25 cm. Biopsied. - Polyp removed as above.       04/07/2017 Initial Diagnosis    Cancer of sigmoid colon ([place])      04/07/2017 Pathology Results    Diagnosis Colon, segmental resection for tumor, Sigmoid - INVASIVE COLORECTAL ADENOCARCINOMA, 2.5 CM. - CARCINOMA INVADES MUSCULARIS PROPRIA. - MARGINS NOT INVOLVED. - METASTATIC CARCINOMA IN TWO OF EIGHTEEN LYMPH NODES (2/18) - PROXIMAL AND DISTAL ANASTOMOTIC RINGS FREE OF TUMOR.  Specimen: Sigmoid colon with proximal and distal  anastomotic rings. Procedure: Resection. Tumor site: Sigmoid colon. Specimen integrity: Intact. Macroscopic intactness of mesorectum: N/A Macroscopic tumor perforation: No. Invasive tumor: Maximum size: 2.5 cm. Histologic type(s): Colorectal adenocarcinoma. Histologic grade and differentiation: G3 G1: well differentiated/low grade G2: moderately differentiated/low grade G3: poorly differentiated/high grade G4: undifferentiated/high grade Type of polyp in which invasive carcinoma arose: No residual polyp. Microscopic extension of invasive tumor: Into muscularis propria. Lymph-Vascular invasion: Present. Peri-neural invasion: Present. Tumor deposit(s) (discontinuous extramural extension): No.  Resection margins: Proximal margin: Free of tumor. Distal margin: Free of tumor. Circumferential (radial) (posterior ascending, posterior descending; lateral and posterior mid-rectum; and entire lower 1/3 rectum): N/A Mesenteric margin (sigmoid and transverse): Free of tumor. Distance closest margin (if all above margins negative): 6 cm from mesenteric margin. Treatment effect (neo-adjuvant therapy): No. Additional polyp(s): No. Non-neoplastic findings: N/A Lymph nodes: number examined - 18; number positive: 2 Pathologic Staging: pT2, pN1b, pMX Ancillary studies: MSI by PCR and MMR by IHC. (Elizabeth Durham:gt, 04/11/17)      04/07/2017 Surgery    Laparoscopic sigmoid colectomy per Dr. Barry Durham      04/19/2017 Imaging    CT chest, abdomen and pelvis with contrast showed no definitive evidence of metastasis, numerous indeterminate subcentimeter hypodense lesions scattered throughout the liver, likely benign.      04/27/2017 Tumor Marker    CEA Pre-op 04/04/17: 1.4 Post-op 04/27/17: 1.27      05/09/2017 Imaging    MRI Abdomen IMPRESSION: 1. No evidence of metastatic colon cancer in the abdomen. 2. Multiple simple hepatic cysts. 3. Known cholelithiasis, not well visualized. No biliary  dilatation.       05/12/2017 -  Chemotherapy     CAPOX Xeloda 1065m BID, 2 weeks on 1 week off, oxliplatin every 3 weeks. Starting 05/12/17. Xeloda held on 05/19/17 due to poor toleration. Proceed with single agent Xeloda 10059mBID starting with Cycle 2 on 06/04/17.      CURRENT THERAPY: CAPOX q3weeks with Xeloda 100031mID, 2 weeks on 1 week off. Starting 05/12/17. Xeloda held on 05/19/17 due to poor toleration. Proceed with single agent Xeloda 1000m77mD starting with Cycle 2 on 06/04/17.   INTERVAL HISTORY: [name] for f/u as scheduled. She is currently off Xeloda, due to begin new cycle in 2 days. She tolerated 1000 mg BID x2 weeks well. Has good appetite, eating and drinking well; denies fatigue. She has intermittent constipation, managed by eating grapes and takes dulcolax PRN; has normal BM every 1-2 days, no nausea, vomiting, or diarrhea. Denies mouth sores, hand or foot redness or pain. She does not always sleep well and is not interested in sleep aid.  REVIEW OF SYSTEMS:   Constitutional: Denies fatigue, fevers, chills or abnormal weight loss (+)intentional weight gain (+) good appetite  Eyes: Denies blurriness of vision Ears, nose, mouth, throat, and face: Denies mucositis or sore throat Respiratory: Denies cough, dyspnea or wheezes Cardiovascular: Denies palpitation, chest discomfort or lower extremity swelling Gastrointestinal:  Denies nausea, vomiting, diarrhea, heartburn, blood in stool, or change in bowel habits (+) intermittent constipation, usually managed with diet, occasional dulcolax  Skin: Denies abnormal skin rashes, no hand/foot redness or pain Lymphatics: Denies new lymphadenopathy or easy bruising Neurological:Denies numbness, tingling or new weaknesses Behavioral/Psych: Mood is stable, no new changes  All other systems were reviewed with the patient and are negative.  MEDICAL HISTORY:  Past Medical History:  Diagnosis Date  . Essential hypertension   .  GERD (gastroesophageal reflux disease)   . GI bleed   . High cholesterol   . Iron deficiency anemia     SURGICAL HISTORY: Past Surgical History:  Procedure Laterality Date  . COLONOSCOPY    . LAPAROSCOPIC SIGMOID COLECTOMY N/A 04/07/2017   Procedure: LAPAROSCOPIC SIGMOID COLECTOMY, ERAS PATHWAY, splenic flexure takedown;  Surgeon: ByerStark Durham;  [facility] [place];  Laterality: N/A;  . TONSILLECTOMY    . TOTAL HIP ARTHROPLASTY Right 2012    I have reviewed the social history and family history with the patient and they are unchanged from previous note.  ALLERGIES:  has No Known Allergies.  MEDICATIONS:  Current Outpatient Medications  Medication Sig Dispense Refill  . alendronate (FOSAMAX) 70 MG tablet Take 70 mg by mouth every 7 (seven) days.  11  . amLODipine (NORVASC) 10 MG tablet Take 10 mg by mouth daily. for blood pressure  1  . atorvastatin (LIPITOR) 10 MG tablet Take 10 mg by mouth daily.  3  . ferrous sulfate 325 (65 FE) MG tablet Take 325 mg by mouth daily.  1  . pantoprazole (PROTONIX) 40 MG tablet Take 1 tablet (40 mg total) by mouth daily. 30 tablet 0  . capecitabine (XELODA) 500 MG tablet Take 3 tabs (1500mg68m AM & 3 tab (1500mg)51mPM, within 30 minutes of meals, take for 14 days on, 7 days off, repeat every 21 days 84 tablet 0  . ondansetron (ZOFRAN) 8 MG tablet Take 1 tablet (8 mg total) by mouth 2 (two) times daily as needed for refractory nausea / vomiting. Start on day 3 after chemotherapy. (Patient  not taking: Reported on 06/23/2017) 30 tablet 1  . potassium chloride SA (KLOR-CON M20) 20 MEQ tablet Take 1 tablet (20 mEq total) by mouth daily. 30 tablet 1  . prochlorperazine (COMPAZINE) 10 MG tablet Take 1 tablet (10 mg total) by mouth every 6 (six) hours as needed (Nausea or vomiting). (Patient not taking: Reported on 06/23/2017) 30 tablet 1   No current facility-administered medications for this visit.     PHYSICAL EXAMINATION: ECOG  PERFORMANCE STATUS: 1 - Symptomatic but completely ambulatory  Vitals:   06/23/17 1210  BP: 126/75  Pulse: 76  Resp: 18  Temp: 98 F (36.7 C)  SpO2: 100%   Filed Weights   06/23/17 1210  Weight: 100 lb 1.6 oz (45.4 kg)    GENERAL:alert, no distress and comfortable SKIN: skin color, texture, turgor are normal, no rashes or significant lesions (+) mild palmar erythema without tenderness; no peeling, cracks, or blisters EYES: normal, Conjunctiva are pink and non-injected, sclera clear OROPHARYNX:no exudate, no erythema and lips, buccal mucosa, and tongue normal  NECK: supple, thyroid normal size, non-tender, without nodularity LYMPH:  no palpable cervical, supraclavicular, or axillary lymphadenopathy LUNGS: clear to auscultation bilaterally with normal breathing effort HEART: regular rate & rhythm and no murmurs and no lower extremity edema ABDOMEN:abdomen soft, non-tender and normal bowel sounds Musculoskeletal:no cyanosis of digits and no clubbing.  NEURO: alert & oriented x 3 with fluent speech, no focal motor/sensory deficits  LABORATORY DATA:  I have reviewed the data as listed CBC Latest Ref Rng & Units 06/23/2017 06/02/2017 05/19/2017  WBC 3.9 - 10.3 10e3/uL 2.9(L) 3.2(L) 4.7  Hemoglobin 11.6 - 15.9 g/dL 11.1(L) 11.2(L) 12.3  Hematocrit 34.8 - 46.6 % 32.5(L) 32.7(L) 36.6  Platelets 145 - 400 10e3/uL 188 160 183     CMP Latest Ref Rng & Units 06/23/2017 06/02/2017 05/19/2017  Glucose 70 - 140 mg/dl 93 93 145(H)  BUN 7.0 - 26.0 mg/dL 19.9 14.3 33.9(H)  Creatinine 0.6 - 1.1 mg/dL 1.0 1.0 1.2(H)  Sodium 136 - 145 mEq/L 140 140 146(H)  Potassium 3.5 - 5.1 mEq/L 3.7 3.9 3.8  Chloride 101 - 111 mmol/L - - -  CO2 22 - 29 mEq/L _0 Calcium 8.4 - 10.4 mg/dL 9.8 9.4 10.5(H)  Total Protein 6.4 - 8.3 g/dL 7.0 6.7 7.8  Total Bilirubin 0.20 - 1.20 mg/dL 0.56 0.61 1.30(H)  Alkaline Phos 40 - 150 U/L 70 57 58  AST 5 - 34 U/L _1 ALT 0 - 55 U/L _2 RADIOGRAPHIC STUDIES: I have personally reviewed the radiological images as listed and agreed with the findings in the report. No results found.   ASSESSMENT & PLAN: Elizabeth Durham is a 76 y.o. female with a history of GI bleed, anemia, HTN, and sigmoid colon cancer   1. Cancer of Sigmoid Colon, invasive adenocarcinoma, grade 3, pT2, pN1b, cM0, stage IIIA 2. Anemia, iron deficiency 3. Hypokalemia 4. Weight loss, anorexia 5. Mild palmar erythema  Ms. Hor appears stable, she tolerated last cycle adjuvant Xeloda well at 1000 mg BID x14 days, 7 days off. Oxaliplatin held from cycle 2 due to poor tolerance. She has minimal symptoms, has been able to gain weight and tolerate single agent Xeloda at reduced dose. Has very mild palmar erythema without pain or sensitivity. VS and weight stable, labs reviewed: CEA in normal range; iron studies improved with IV feraheme x1, she will continue oral iron supplement daily. Cmet unremarkable,  Hypokalemia is resolved, she will continue 1 tablet 20 mEq K daily, decreased from 1 tablet BID. CBC stable, mild anemia with Hgb 11.1; adequate to begin next cycle. Will increase Xeloda to 1500 mg BID x14 days, 7 days off with cycle 3, she will begin 12/22. Continue holding Oxaliplatin.  I encouraged her to buy OTC hydrocortisone to apply to palms and feet if they become more red or tender with increased dose. She knows to call clinic if she develops new or worsening symptoms. Will return in 3 weeks for f/u and cycle 4.  PLAN -Decrease K to 1 tablet 20 mEq daily -Begin cycle 3 Xeloda 1500 mg BID x14 days/7 days off on 06/25/17, refilled today -Return in 3 weeks for f/u with Dr. Burr Medico and cycle 4 -Have hydrocortisone topical cream on hand if palmar-plantar erythema develops -Reviewed s/sx that she should call to report  All questions were answered. The patient knows to call the clinic with any problems, questions or concerns. No barriers to learning was detected.      Elizabeth Feeling, NP 06/23/17

## 2017-06-23 NOTE — Telephone Encounter (Signed)
xeloda script faxed to [facility].

## 2017-06-23 NOTE — Telephone Encounter (Signed)
Gave patient avs and calendar with appts per 12/20 los.  °

## 2017-07-06 ENCOUNTER — Other Ambulatory Visit: Payer: Self-pay | Admitting: *Deleted

## 2017-07-06 MED ORDER — POTASSIUM CHLORIDE CRYS ER 20 MEQ PO TBCR: 20.0000 meq | EXTENDED_RELEASE_TABLET | Freq: Every day | ORAL | 1 refills | Status: DC

## 2017-07-13 NOTE — Progress Notes (Signed)
[place]  [phone] [phone]  Clinic Follow up Note   Patient Care Team: Cyndi Bender, PA-C as PCP - General (Physician Assistant) [name], MD as Consulting Physician (General Surgery) Truitt Merle, MD as Consulting Physician (Hematology) Alla Feeling, NP as Nurse Practitioner (Nurse Practitioner) 07/15/2017   CHIEF COMPLAINTS:  Follow up cancer of Sigmoid Colon   Oncology History   Cancer Staging Cancer of sigmoid colon [facility]) Staging form: Colon and Rectum, AJCC 8th Edition - Pathologic stage from 04/07/2017: Stage IIIA (pT2, pN1b, cM0) - Signed by Truitt Merle, MD on 04/26/2017       Cancer of sigmoid colon ([place])   03/22/2017 Procedure    Colonoscopy per Dr. Marian Sorrow A rectal examination was performed and was normal. The endoscope was then inserted into the rectum and advanced under direct visualization to about 25 cm where a large, near obstructing mass was noted. This had a malignant appearance. It was biopsied. That area could not be traversed with the adult colonoscope. Small amount of bleeding was noted from the biopsy site, but this stopped right away. 7 mm pedunculated polyp was noted at 15 cm. This was removed using hot snare and retrieved for pathology. Ink was injected into the submucosa at 20 cm (5 cm distal to the mass). The endoscope was then withdrawn and the procedure terminated.   Findings / Comments: - Mass at 25 cm. Biopsied. - Polyp removed as above.       04/07/2017 Initial Diagnosis    Cancer of sigmoid colon ([place])      04/07/2017 Pathology Results    Diagnosis Colon, segmental resection for tumor, Sigmoid - INVASIVE COLORECTAL ADENOCARCINOMA, 2.5 CM. - CARCINOMA INVADES MUSCULARIS PROPRIA. - MARGINS NOT INVOLVED. - METASTATIC CARCINOMA IN TWO OF EIGHTEEN LYMPH NODES (2/18) - PROXIMAL AND DISTAL ANASTOMOTIC RINGS FREE OF TUMOR.  Specimen: Sigmoid colon with proximal and distal anastomotic  rings. Procedure: Resection. Tumor site: Sigmoid colon. Specimen integrity: Intact. Macroscopic intactness of mesorectum: N/A Macroscopic tumor perforation: No. Invasive tumor: Maximum size: 2.5 cm. Histologic type(s): Colorectal adenocarcinoma. Histologic grade and differentiation: G3 G1: well differentiated/low grade G2: moderately differentiated/low grade G3: poorly differentiated/high grade G4: undifferentiated/high grade Type of polyp in which invasive carcinoma arose: No residual polyp. Microscopic extension of invasive tumor: Into muscularis propria. Lymph-Vascular invasion: Present. Peri-neural invasion: Present. Tumor deposit(s) (discontinuous extramural extension): No.  Resection margins: Proximal margin: Free of tumor. Distal margin: Free of tumor. Circumferential (radial) (posterior ascending, posterior descending; lateral and posterior mid-rectum; and entire lower 1/3 rectum): N/A Mesenteric margin (sigmoid and transverse): Free of tumor. Distance closest margin (if all above margins negative): 6 cm from mesenteric margin. Treatment effect (neo-adjuvant therapy): No. Additional polyp(s): No. Non-neoplastic findings: N/A Lymph nodes: number examined - 18; number positive: 2 Pathologic Staging: pT2, pN1b, pMX Ancillary studies: MSI by PCR and MMR by IHC. (JDP:gt, 04/11/17)      04/07/2017 Surgery    Laparoscopic sigmoid colectomy per Dr. Barry Dienes      04/19/2017 Imaging    CT chest, abdomen and pelvis with contrast showed no definitive evidence of metastasis, numerous indeterminate subcentimeter hypodense lesions scattered throughout the liver, likely benign.      04/27/2017 Tumor Marker    CEA Pre-op 04/04/17: 1.4 Post-op 04/27/17: 1.27      05/09/2017 Imaging    MRI Abdomen IMPRESSION: 1. No evidence of metastatic colon cancer in the abdomen. 2. Multiple simple hepatic cysts. 3. Known cholelithiasis, not well visualized. No biliary dilatation.  05/12/2017 -  Chemotherapy     CAPOX Xeloda 1010m BID, 2 weeks on 1 week off, oxliplatin every 3 weeks. Starting 05/12/17. Xeloda held on 05/19/17 due to poor toleration. Proceed with single agent Xeloda 10085mBID starting with Cycle 2 on 06/04/17.       05/19/2017 Tumor Marker    CEA: 1.35      06/23/2017 Tumor Marker    CEA: 1.31        HISTORY OF PRESENTING ILLNESS: 04/27/17  Elizabeth Guile89.o. female is here because of newly diagnosed colon cancer. She presents to the clinic today accompanied by her daughter. She was referred by Dr. ByBarry DienesIn 12/2015 [facility] and acute blood loss anemia, she was also found to be iron deficient. Anemia improved with blood transfusion and she began taking oral iron supplementation. A colonoscopy was scheduled but ultimately cancelled because she could not tolerate prep. It was not rescheduled. She reports chronic constipation with intermittent blood on tissue after hard BM.  She previously lost weight but has gained it back. Colonoscopy was eventually rescheduled and performed on 03/22/2017 by Dr. BiMarian Sorrowo follow up on GI bleeding and because she had never had one. Colonoscopy revealed a malignant appearing near obstructing mass at about 25 cm, and a pedunculated polyp at 15 cm; both were biopsied. Colon mass pathology revealed scant fragments of intramucosal adenocarcinoma arising in an adenomatous polyp with high grade dysplasia; polyp biopsy revealed tubulovillous adeno suspicious for high grade dysplasia. She was referred to Dr. ByBarry Dienesor segmental resection on 04/07/17, pathology revealed invasive adenocarcinoma 2.5cm that invades the muscularis propria, margins were negative, with metastatic disease in 2 of 18 lymph nodes.   Today she is recovering well from surgery. She has good appetite. Continues to have constipation, has not used medication; last BM 1 day ago. She denies pain, nausea, vomiting, blood in  stool, or fatigue.  Patient is not currently working. She walks around the block and does yard work for exercise. She performs ADL's independently, lives with her brother and daughter; she is without a car, transportation is unreliable. Family has to borrow friend's car for rides.    CURRENT THERAPY: CAPOX q3weeks with Xeloda 100049mID, 2 weeks on 1 week off. Starting 05/12/17. Xeloda held on 05/19/17 due to poor toleration. Proceed with single agent Xeloda 1000m23mD starting with Cycle 2 on 06/04/17. Started cycle 3 Xeloda 1500 mg BID x14 days/7 days off on 06/25/17   INTERVAL THERAPY:  BarbSELA FALKhere for a follow up. She presents to the clinic today accompanied by her daughter. She notes she started feeling much better. She will be starting Cycle 4 of 3 1500 mg tablets BID Xeloda tomorrow after she tolerated cycle 3 very well. She takes the Xeloda pill for 14 days/7 days off.   She has some mild redness on her hands that she has been using lotion and hydrocortisone cream regularly with some relief.   On review of systems she denies diarrhea, nausea, and a decreased appetite  Pertinent positives are listed and detailed within the above HPI.     REVIEW OF SYSTEMS:   Constitutional: Denies fatigue, fevers, chills or abnormal night sweats  Eyes: Denies blurriness of vision, double vision or watery eyes Ears, nose, mouth, throat, and face: Denies mucositis or sore throat Respiratory: Denies cough, dyspnea or wheezes Cardiovascular: Denies palpitation, chest discomfort  Gastrointestinal:  Denies nausea, vomiting, diarrhea, heartburn,  blood in stool, abdominal pain, or change in bowel habits  Skin: (+) redness to bilateral palms of her hands Lymphatics: Denies new lymphadenopathy or easy bruising Neurological:Denies numbness, tingling or new weaknesses Behavioral/Psych: Mood is stable, no new changes  All other systems were reviewed with the patient and are negative.   MEDICAL  HISTORY:  Past Medical History:  Diagnosis Date  . Essential hypertension   . GERD (gastroesophageal reflux disease)   . GI bleed   . High cholesterol   . Iron deficiency anemia     SURGICAL HISTORY: Past Surgical History:  Procedure Laterality Date  . COLONOSCOPY    . LAPAROSCOPIC SIGMOID COLECTOMY N/A 04/07/2017   Procedure: LAPAROSCOPIC SIGMOID COLECTOMY, ERAS PATHWAY, splenic flexure takedown;  Surgeon: [name], MD;  Location: [place];  Service: General;  Laterality: N/A;  . TONSILLECTOMY    . TOTAL HIP ARTHROPLASTY Right 2012    SOCIAL HISTORY: Social History   Socioeconomic History  . Marital status: Widowed    Spouse name: Not on file  . Number of children: Not on file  . Years of education: Not on file  . Highest education level: Not on file  Social Needs  . Financial resource strain: Not on file  . Food insecurity - worry: Not on file  . Food insecurity - inability: Not on file  . Transportation needs - medical: Not on file  . Transportation needs - non-medical: Not on file  Occupational History  . Not on file  Tobacco Use  . Smoking status: Former Smoker    Packs/day: 0.50    Years: 7.00    Pack years: 3.50    Types: Cigarettes    Last attempt to quit: 1983    Years since quitting: 36.0  . Smokeless tobacco: Never Used  Substance and Sexual Activity  . Alcohol use: No    Alcohol/week: 0.0 oz  . Drug use: No  . Sexual activity: Not on file  Other Topics Concern  . Not on file  Social History Narrative  . Not on file    FAMILY HISTORY: Family History  Problem Relation Age of Onset  . Leukemia Mother   . Cancer Father        unknown type  Negative family history for GI cancer; her father had unknown type of cancer   ALLERGIES:  has No Known Allergies.  MEDICATIONS:  Current Outpatient Medications  Medication Sig Dispense Refill  . alendronate (FOSAMAX) 70 MG tablet Take 70 mg by mouth every 7 (seven) days.  11  . amLODipine (NORVASC) 10  MG tablet Take 10 mg by mouth daily. for blood pressure  1  . atorvastatin (LIPITOR) 10 MG tablet Take 10 mg by mouth daily.  3  . ferrous sulfate 325 (65 FE) MG tablet Take 325 mg by mouth daily.  1  . pantoprazole (PROTONIX) 40 MG tablet Take 1 tablet (40 mg total) by mouth daily. 30 tablet 0  . potassium chloride SA (KLOR-CON M20) 20 MEQ tablet Take 1 tablet (20 mEq total) by mouth daily. 90 tablet 1  . capecitabine (XELODA) 500 MG tablet Take 3 tabs (1577m) in AM & 3 tab (15035m in PM, within 30 minutes of meals, take for 14 days on, 7 days off, repeat every 21 days 84 tablet 2  . ondansetron (ZOFRAN) 8 MG tablet Take 1 tablet (8 mg total) by mouth 2 (two) times daily as needed for refractory nausea / vomiting. Start on day 3 after chemotherapy. (Patient  not taking: Reported on 06/23/2017) 30 tablet 1  . prochlorperazine (COMPAZINE) 10 MG tablet Take 1 tablet (10 mg total) by mouth every 6 (six) hours as needed (Nausea or vomiting). (Patient not taking: Reported on 06/23/2017) 30 tablet 1   No current facility-administered medications for this visit.     PHYSICAL EXAMINATION: ECOG PERFORMANCE STATUS: 1 - Symptomatic but completely ambulatory  Vitals:   07/15/17 0957  BP: (!) 142/77  Pulse: 66  Resp: 18  Temp: 97.8 F (36.6 C)  SpO2: 100%   Filed Weights   07/15/17 0957  Weight: 99 lb (44.9 kg)     GENERAL:alert, no distress and comfortable SKIN: skin color, texture, turgor are normal, no rashes or significant lesions EYES: normal, conjunctiva are pink and non-injected, sclera clear OROPHARYNX:no exudate, no erythema and lips, buccal mucosa, and tongue normal  NECK: supple, thyroid normal size, non-tender, without nodularity LYMPH:  no palpable cervical, axillary, or inguinal lymphadenopathy  LUNGS: clear to auscultation bilaterally with normal breathing effort HEART: regular rate & rhythm, no murmurs; trace bilateral lower extremity edema ABDOMEN:abdomen soft, non-tender  and normal bowel sounds. No palpable hepatomegaly. 4 abdominal incisions and midline incision covered with steri strips, healing well, no erythema or drainage Musculoskeletal:no cyanosis of digits and no clubbing  PSYCH: alert & oriented x 3 with fluent speech NEURO: no focal motor/sensory deficits  LABORATORY DATA:  I have reviewed the data as listed CBC Latest Ref Rng & Units 07/15/2017 06/23/2017 06/02/2017  WBC 3.9 - 10.3 K/uL 4.0 2.9(L) 3.2(L)  Hemoglobin 11.6 - 15.9 g/dL 11.6 11.1(L) 11.2(L)  Hematocrit 34.8 - 46.6 % 33.7(L) 32.5(L) 32.7(L)  Platelets 145 - 400 K/uL 165 188 160    CMP Latest Ref Rng & Units 07/15/2017 06/23/2017 06/02/2017  Glucose 70 - 140 mg/dL 108 93 93  BUN 7 - 26 mg/dL 16 19.9 14.3  Creatinine 0.60 - 1.10 mg/dL 1.04 1.0 1.0  Sodium 136 - 145 mmol/L 141 140 140  Potassium 3.3 - 4.7 mmol/L 5.1(H) 3.7 3.9  Chloride 98 - 109 mmol/L 107 - -  CO2 22 - 29 mmol/L '25 23 25  ' Calcium 8.4 - 10.4 mg/dL 9.8 9.8 9.4  Total Protein 6.4 - 8.3 g/dL 7.3 7.0 6.7  Total Bilirubin 0.2 - 1.2 mg/dL 1.0 0.56 0.61  Alkaline Phos 40 - 150 U/L 76 70 57  AST 5 - 34 U/L '20 20 18  ' ALT 0 - 55 U/L '20 12 13    ' RADIOGRAPHIC STUDIES: I have personally reviewed the radiological images as listed and agreed with the findings in the report. No results found.   MR Abdomen w/o contrast 05/09/17 IMPRESSION: 1. No evidence of metastatic colon cancer in the abdomen. 2. Multiple simple hepatic cysts. 3. Known cholelithiasis, not well visualized. No biliary dilatation.  CT CAP 04/19/17 IMPRESSION: 1. No definite evidence of metastatic disease in the chest, abdomen or pelvis. 2. Numerous indeterminate subcentimeter hypodense lesions scattered throughout the liver, too small to characterize, more likely benign. Recommend attention on follow-up MRI (preferred) abdomen without and with IV contrast or CT abdomen with IV contrast in 3-6 months. This recommendation follows ACR consensus guidelines:  Managing Incidental Findings on Abdominal CT: White Paper of the ACR Incidental Findings Committee. J Am Coll Radiol 2010;7:754-773. 3. Nonspecific small volume free fluid in the pelvis, probably postsurgical. 4. Chronic findings include: Aortic Atherosclerosis (ICD10-I70.0). Cholelithiasis.  ASSESSMENT & PLAN:  RAECHEL MARCOS is a 77 y.o. female with a history of GI bleed, anemia,  HTN, and sigmoid colon cancer    1. Cancer of Sigmoid Colon, invasive adenocarcinoma, grade 3, pT2, pN1b, cM0, stage IIIA -I discussed her colonoscopy, imaging, and surgical pathology findings in detail with the patient and family -Staging CT CAP shows no definite metastatic disease but does show numerous indeterminate sub-cm hypodense lesions scattered throughout the liver, too small to characterize but likely benign --MRI of abdomen on 05/09/17 revealed multiple non-enhancing hepatic cysts but no evidence of metastatic colon cancer in the abdomen -I reviewed her moderate risk for cancer recurrence after surgery -adjuvant chemotherapy is recommended, which is the standard care for stage III colon cancer. She is 75, but overall healthy and fit, no major medical comorbidities, has good performance status. Would be a candidate for chemo.  I discussed the option of FOLFOX q2 weeks vs CAPOX q3 weeks with Xeloda daily BID x2 weeks with 1 week off; the patient is interested in pursuing chemotherapy -I suspect she would tolerate FOLFOX better overall, but due to her transportation issues and lack of a car, she prefers CAPOX on q3 week schedule.  -she has started adjuvant CAPOX, reduced Xeloda to 106m BID, 2 weeks on and 1 week off and oxaliplatin on 05/12/17 for cycle 1 due to elevated Cr and weight loss -She tolerated cycel 1 poorly with anorexia and dehydration, she lost 8 pounds in 1 weeks.  -due to her poor tolerance to chemo, will hold oxaliplatin, and continue Xeloda single agent.  She has been tolerating for dose 1500  mg twice daily well.  Plan for total of 8 cycles. -Labs reviewed, adequate to start cycle 4 of single agent 1500 mg Xeloda tablets twice daily for 14 days/7 days off.  -I plan to repeat her CT scans for restaging after she completes adjuvant Xeloda.   2. Anemia, iron deficiency  -she has been iron deficient in the past, on oral ferrous sulfate 1 tab daily -She received IV Feraheme on 11/8 for iron deficiency anemia, serum iron responded to 229, Sat 81%, ferritin 1064. Her Hg recovered at 12.3 on 11/15  -HG slightly lowered to 11.2 today (06/02/17) Hgb is 11.6 today (07/15/17)   3. Hypokalemia -she takes oral K 10 mEq PRN with lasix  -K 3.2 on 04/27/17, I recommend she take 20 mEq daily, increased to BID on 05/12/17  -K 5.1 today, diarrhea resolved, will stop her KCL supplement   4. Weight loss, anorexia  - I have encouraged her to eat whatever she wants in order to gain weight, I placed dietary referral -Worsened with chemotherapy. Dietician will do close follow up until weight is stable.  -Gained some weight back since holding iv chemo. She is doing much better now    PLAN: -She is tolerating the Xeloda well, will start cycle 4  Xeloda1500 mg bid 14 days on and 7 days off.  Lab and f/u in 3 weeks    No orders of the defined types were placed in this encounter.   All questions were answered. The patient knows to call the clinic with any problems, questions or concerns.   YTruitt Merle 07/15/2017   This document serves as a record of services personally performed by YTruitt Merle MD. It was created on her behalf by DTheresia Bough a trained medical scribe. The creation of this record is based on the scribe's personal observations and the provider's statements to them.   I have reviewed the above documentation for accuracy and completeness, and I agree with the above.

## 2017-07-15 ENCOUNTER — Other Ambulatory Visit: Payer: Self-pay | Admitting: Nurse Practitioner

## 2017-07-15 ENCOUNTER — Inpatient Hospital Stay: Payer: Medicare HMO

## 2017-07-15 ENCOUNTER — Encounter: Payer: Self-pay | Admitting: Hematology

## 2017-07-15 ENCOUNTER — Inpatient Hospital Stay: Payer: Medicare HMO | Attending: Hematology | Admitting: Hematology

## 2017-07-15 VITALS — BP 142/77 | HR 66 | Temp 97.8°F | Resp 18 | Ht 64.0 in | Wt 99.0 lb

## 2017-07-15 DIAGNOSIS — C187 Malignant neoplasm of sigmoid colon: Secondary | ICD-10-CM | POA: Diagnosis not present

## 2017-07-15 DIAGNOSIS — D509 Iron deficiency anemia, unspecified: Secondary | ICD-10-CM

## 2017-07-15 DIAGNOSIS — K7689 Other specified diseases of liver: Secondary | ICD-10-CM | POA: Diagnosis not present

## 2017-07-15 DIAGNOSIS — R630 Anorexia: Secondary | ICD-10-CM | POA: Diagnosis not present

## 2017-07-15 DIAGNOSIS — Z806 Family history of leukemia: Secondary | ICD-10-CM | POA: Diagnosis not present

## 2017-07-15 DIAGNOSIS — Z809 Family history of malignant neoplasm, unspecified: Secondary | ICD-10-CM

## 2017-07-15 DIAGNOSIS — Z79899 Other long term (current) drug therapy: Secondary | ICD-10-CM | POA: Diagnosis not present

## 2017-07-15 DIAGNOSIS — K219 Gastro-esophageal reflux disease without esophagitis: Secondary | ICD-10-CM | POA: Diagnosis not present

## 2017-07-15 DIAGNOSIS — Z9221 Personal history of antineoplastic chemotherapy: Secondary | ICD-10-CM | POA: Diagnosis not present

## 2017-07-15 DIAGNOSIS — R634 Abnormal weight loss: Secondary | ICD-10-CM

## 2017-07-15 DIAGNOSIS — I700 Atherosclerosis of aorta: Secondary | ICD-10-CM | POA: Diagnosis not present

## 2017-07-15 DIAGNOSIS — E7800 Pure hypercholesterolemia, unspecified: Secondary | ICD-10-CM | POA: Diagnosis not present

## 2017-07-15 DIAGNOSIS — E630 Essential fatty acid [EFA] deficiency: Secondary | ICD-10-CM

## 2017-07-15 DIAGNOSIS — Z8719 Personal history of other diseases of the digestive system: Secondary | ICD-10-CM

## 2017-07-15 DIAGNOSIS — K5900 Constipation, unspecified: Secondary | ICD-10-CM

## 2017-07-15 DIAGNOSIS — E876 Hypokalemia: Secondary | ICD-10-CM

## 2017-07-15 DIAGNOSIS — D500 Iron deficiency anemia secondary to blood loss (chronic): Secondary | ICD-10-CM

## 2017-07-15 DIAGNOSIS — I10 Essential (primary) hypertension: Secondary | ICD-10-CM

## 2017-07-15 DIAGNOSIS — Z87891 Personal history of nicotine dependence: Secondary | ICD-10-CM | POA: Diagnosis not present

## 2017-07-15 DIAGNOSIS — C779 Secondary and unspecified malignant neoplasm of lymph node, unspecified: Secondary | ICD-10-CM

## 2017-07-15 LAB — CBC WITH DIFFERENTIAL/PLATELET
BASOS PCT: 1 %
Basophils Absolute: 0 10*3/uL (ref 0.0–0.1)
EOS ABS: 0.3 10*3/uL (ref 0.0–0.5)
EOS PCT: 7 %
HCT: 33.7 % — ABNORMAL LOW (ref 34.8–46.6)
HEMOGLOBIN: 11.6 g/dL (ref 11.6–15.9)
LYMPHS ABS: 0.6 10*3/uL — AB (ref 0.9–3.3)
Lymphocytes Relative: 15 %
MCH: 33.4 pg (ref 25.1–34.0)
MCHC: 34.4 g/dL (ref 31.5–36.0)
MCV: 97.1 fL (ref 79.5–101.0)
MONOS PCT: 16 %
Monocytes Absolute: 0.7 10*3/uL (ref 0.1–0.9)
Neutro Abs: 2.5 10*3/uL (ref 1.5–6.5)
Neutrophils Relative %: 61 %
PLATELETS: 165 10*3/uL (ref 145–400)
RBC: 3.47 MIL/uL — ABNORMAL LOW (ref 3.70–5.45)
RDW: 20.1 % — AB (ref 11.2–16.1)
WBC: 4 10*3/uL (ref 3.9–10.3)

## 2017-07-15 LAB — COMPREHENSIVE METABOLIC PANEL
ALBUMIN: 4.4 g/dL (ref 3.5–5.0)
ALK PHOS: 76 U/L (ref 40–150)
ALT: 20 U/L (ref 0–55)
AST: 20 U/L (ref 5–34)
Anion gap: 9 (ref 3–11)
BUN: 16 mg/dL (ref 7–26)
CALCIUM: 9.8 mg/dL (ref 8.4–10.4)
CHLORIDE: 107 mmol/L (ref 98–109)
CO2: 25 mmol/L (ref 22–29)
Creatinine, Ser: 1.04 mg/dL (ref 0.60–1.10)
GFR calc non Af Amer: 52 mL/min — ABNORMAL LOW (ref >60)
GFR, EST AFRICAN AMERICAN: 60 mL/min — AB (ref >60)
GLUCOSE: 108 mg/dL (ref 70–140)
POTASSIUM: 5.1 mmol/L — AB (ref 3.3–4.7)
Sodium: 141 mmol/L (ref 136–145)
Total Bilirubin: 1 mg/dL (ref 0.2–1.2)
Total Protein: 7.3 g/dL (ref 6.4–8.3)

## 2017-07-15 MED ORDER — CAPECITABINE 500 MG PO TABS: ORAL_TABLET | ORAL | 2 refills | Status: DC

## 2017-07-15 MED FILL — CAPECITABINE 500 MG TABLET: 500 | 21 days supply | Qty: 84 | Fill #0

## 2017-07-25 ENCOUNTER — Other Ambulatory Visit: Payer: Self-pay | Admitting: Pharmacist

## 2017-08-04 ENCOUNTER — Telehealth: Payer: Self-pay | Admitting: Nurse Practitioner

## 2017-08-04 ENCOUNTER — Inpatient Hospital Stay: Payer: Medicare HMO

## 2017-08-04 ENCOUNTER — Encounter: Payer: Self-pay | Admitting: Nurse Practitioner

## 2017-08-04 ENCOUNTER — Inpatient Hospital Stay (HOSPITAL_BASED_OUTPATIENT_CLINIC_OR_DEPARTMENT_OTHER): Payer: Medicare HMO | Admitting: Nurse Practitioner

## 2017-08-04 VITALS — BP 120/66 | HR 62 | Temp 97.6°F | Resp 18 | Ht 64.0 in | Wt 98.0 lb

## 2017-08-04 DIAGNOSIS — Z87891 Personal history of nicotine dependence: Secondary | ICD-10-CM

## 2017-08-04 DIAGNOSIS — I10 Essential (primary) hypertension: Secondary | ICD-10-CM | POA: Diagnosis not present

## 2017-08-04 DIAGNOSIS — E876 Hypokalemia: Secondary | ICD-10-CM

## 2017-08-04 DIAGNOSIS — D500 Iron deficiency anemia secondary to blood loss (chronic): Secondary | ICD-10-CM

## 2017-08-04 DIAGNOSIS — L538 Other specified erythematous conditions: Secondary | ICD-10-CM

## 2017-08-04 DIAGNOSIS — I700 Atherosclerosis of aorta: Secondary | ICD-10-CM

## 2017-08-04 DIAGNOSIS — R634 Abnormal weight loss: Secondary | ICD-10-CM

## 2017-08-04 DIAGNOSIS — C187 Malignant neoplasm of sigmoid colon: Secondary | ICD-10-CM | POA: Diagnosis not present

## 2017-08-04 DIAGNOSIS — Z8719 Personal history of other diseases of the digestive system: Secondary | ICD-10-CM

## 2017-08-04 DIAGNOSIS — D509 Iron deficiency anemia, unspecified: Secondary | ICD-10-CM | POA: Diagnosis not present

## 2017-08-04 DIAGNOSIS — Z9221 Personal history of antineoplastic chemotherapy: Secondary | ICD-10-CM | POA: Diagnosis not present

## 2017-08-04 DIAGNOSIS — K219 Gastro-esophageal reflux disease without esophagitis: Secondary | ICD-10-CM

## 2017-08-04 DIAGNOSIS — C779 Secondary and unspecified malignant neoplasm of lymph node, unspecified: Secondary | ICD-10-CM | POA: Diagnosis not present

## 2017-08-04 DIAGNOSIS — E7800 Pure hypercholesterolemia, unspecified: Secondary | ICD-10-CM

## 2017-08-04 DIAGNOSIS — R630 Anorexia: Secondary | ICD-10-CM

## 2017-08-04 DIAGNOSIS — K5900 Constipation, unspecified: Secondary | ICD-10-CM | POA: Diagnosis not present

## 2017-08-04 DIAGNOSIS — K7689 Other specified diseases of liver: Secondary | ICD-10-CM

## 2017-08-04 DIAGNOSIS — Z79899 Other long term (current) drug therapy: Secondary | ICD-10-CM

## 2017-08-04 DIAGNOSIS — Z806 Family history of leukemia: Secondary | ICD-10-CM

## 2017-08-04 DIAGNOSIS — Z809 Family history of malignant neoplasm, unspecified: Secondary | ICD-10-CM

## 2017-08-04 LAB — CBC WITH DIFFERENTIAL/PLATELET
BASOS PCT: 0 %
Basophils Absolute: 0 10*3/uL (ref 0.0–0.1)
Eosinophils Absolute: 0.4 10*3/uL (ref 0.0–0.5)
Eosinophils Relative: 10 %
HCT: 29 % — ABNORMAL LOW (ref 34.8–46.6)
HEMOGLOBIN: 10 g/dL — AB (ref 11.6–15.9)
LYMPHS ABS: 0.6 10*3/uL — AB (ref 0.9–3.3)
Lymphocytes Relative: 14 %
MCH: 33.8 pg (ref 25.1–34.0)
MCHC: 34.5 g/dL (ref 31.5–36.0)
MCV: 98 fL (ref 79.5–101.0)
MONOS PCT: 15 %
Monocytes Absolute: 0.7 10*3/uL (ref 0.1–0.9)
NEUTROS ABS: 2.6 10*3/uL (ref 1.5–6.5)
NEUTROS PCT: 61 %
Platelets: 159 10*3/uL (ref 145–400)
RBC: 2.96 MIL/uL — ABNORMAL LOW (ref 3.70–5.45)
RDW: 19.1 % — ABNORMAL HIGH (ref 11.2–14.5)
WBC: 4.3 10*3/uL (ref 3.9–10.3)

## 2017-08-04 LAB — IRON AND TIBC
Iron: 85 ug/dL (ref 41–142)
SATURATION RATIOS: 24 % (ref 21–57)
TIBC: 349 ug/dL (ref 236–444)
UIBC: 264 ug/dL

## 2017-08-04 LAB — COMPREHENSIVE METABOLIC PANEL
ALT: 10 U/L (ref 0–55)
ANION GAP: 11 (ref 3–11)
AST: 20 U/L (ref 5–34)
Albumin: 4.3 g/dL (ref 3.5–5.0)
Alkaline Phosphatase: 93 U/L (ref 40–150)
BUN: 18 mg/dL (ref 7–26)
CHLORIDE: 107 mmol/L (ref 98–109)
CO2: 22 mmol/L (ref 22–29)
Calcium: 9.2 mg/dL (ref 8.4–10.4)
Creatinine, Ser: 0.92 mg/dL (ref 0.60–1.10)
GFR calc non Af Amer: 60 mL/min — ABNORMAL LOW (ref >60)
Glucose, Bld: 98 mg/dL (ref 70–140)
Potassium: 3.7 mmol/L (ref 3.5–5.1)
SODIUM: 140 mmol/L (ref 136–145)
Total Bilirubin: 1.2 mg/dL (ref 0.2–1.2)
Total Protein: 6.9 g/dL (ref 6.4–8.3)

## 2017-08-04 LAB — CEA (IN HOUSE-CHCC): CEA (CHCC-In House): 1.71 ng/mL (ref 0.00–5.00)

## 2017-08-04 LAB — FERRITIN: FERRITIN: 241 ng/mL (ref 9–269)

## 2017-08-04 NOTE — Progress Notes (Signed)
[place]  [phone] [phone]  Clinic Follow up Note   Patient Care Team: Elizabeth Bender, PA-C as PCP - General (Physician Assistant) Elizabeth Klein, MD as Consulting Physician (General Surgery) Elizabeth Merle, MD as Consulting Physician (Hematology) Elizabeth Feeling, NP as Nurse Practitioner (Nurse Practitioner) 08/04/2017  CHIEF COMPLAINT: Follow up cancer of sigmoid colon   SUMMARY OF ONCOLOGIC HISTORY: Oncology History   Cancer Staging Cancer of sigmoid colon [facility]) Staging form: Colon and Rectum, AJCC 8th Edition - Pathologic stage from 04/07/2017: Stage IIIA (pT2, pN1b, cM0) - Signed by Elizabeth Merle, MD on 04/26/2017       Cancer of sigmoid colon ([place])   03/22/2017 Procedure    Colonoscopy per Dr. Marian Durham A rectal examination was performed and was normal. The endoscope was then inserted into the rectum and advanced under direct visualization to about 25 cm where a large, near obstructing mass was noted. This had a malignant appearance. It was biopsied. That area could not be traversed with the adult colonoscope. Small amount of bleeding was noted from the biopsy site, but this stopped right away. 7 mm pedunculated polyp was noted at 15 cm. This was removed using hot snare and retrieved for pathology. Ink was injected into the submucosa at 20 cm (5 cm distal to the mass). The endoscope was then withdrawn and the procedure terminated.   Findings / Comments: - Mass at 25 cm. Biopsied. - Polyp removed as above.       04/07/2017 Initial Diagnosis    Cancer of sigmoid colon ([place])      04/07/2017 Pathology Results    Diagnosis Colon, segmental resection for tumor, Sigmoid - INVASIVE COLORECTAL ADENOCARCINOMA, 2.5 CM. - CARCINOMA INVADES MUSCULARIS PROPRIA. - MARGINS NOT INVOLVED. - METASTATIC CARCINOMA IN TWO OF EIGHTEEN LYMPH NODES (2/18) - PROXIMAL AND DISTAL ANASTOMOTIC RINGS FREE OF TUMOR.  Specimen: Sigmoid colon with proximal and distal  anastomotic rings. Procedure: Resection. Tumor site: Sigmoid colon. Specimen integrity: Intact. Macroscopic intactness of mesorectum: N/A Macroscopic tumor perforation: No. Invasive tumor: Maximum size: 2.5 cm. Histologic type(s): Colorectal adenocarcinoma. Histologic grade and differentiation: G3 G1: well differentiated/low grade G2: moderately differentiated/low grade G3: poorly differentiated/high grade G4: undifferentiated/high grade Type of polyp in which invasive carcinoma arose: No residual polyp. Microscopic extension of invasive tumor: Into muscularis propria. Lymph-Vascular invasion: Present. Peri-neural invasion: Present. Tumor deposit(s) (discontinuous extramural extension): No.  Resection margins: Proximal margin: Free of tumor. Distal margin: Free of tumor. Circumferential (radial) (posterior ascending, posterior descending; lateral and posterior mid-rectum; and entire lower 1/3 rectum): N/A Mesenteric margin (sigmoid and transverse): Free of tumor. Distance closest margin (if all above margins negative): 6 cm from mesenteric margin. Treatment effect (neo-adjuvant therapy): No. Additional polyp(s): No. Non-neoplastic findings: N/A Lymph nodes: number examined - 18; number positive: 2 Pathologic Staging: pT2, pN1b, pMX Ancillary studies: MSI by PCR and MMR by IHC. (Elizabeth Durham:gt, 04/11/17)      04/07/2017 Surgery    Laparoscopic sigmoid colectomy per Dr. Barry Durham      04/19/2017 Imaging    CT chest, abdomen and pelvis with contrast showed no definitive evidence of metastasis, numerous indeterminate subcentimeter hypodense lesions scattered throughout the liver, likely benign.      04/27/2017 Tumor Marker    CEA Pre-op 04/04/17: 1.4 Post-op 04/27/17: 1.27      05/09/2017 Imaging    MRI Abdomen IMPRESSION: 1. No evidence of metastatic colon cancer in the abdomen. 2. Multiple simple hepatic cysts. 3. Known cholelithiasis, not well visualized. No  biliary  dilatation.       05/12/2017 -  Chemotherapy     CAPOX Xeloda 1069m BID, 2 weeks on 1 week off, oxliplatin every 3 weeks. Starting 05/12/17. Xeloda held on 05/19/17 due to poor toleration. Proceed with single agent Xeloda 10062mBID starting with Cycle 2 on 06/04/17.       05/19/2017 Tumor Marker    CEA: 1.35      06/23/2017 Tumor Marker    CEA: 1.31     CURRENT THERAPY: CAPOX q3weeks with Xeloda 100012mID, 2 weeks on 1 week off. Starting 05/12/17. Xeloda held on 05/19/17 due to poor toleration. Proceed with single agent Xeloda 1000m4mD starting with Cycle 2 on 06/04/17. Started cycle 3 Xeloda 1500 mg BID x14 days/7 days off on 06/25/17  INTERVAL HISTORY: [name] for follow up prior to next cycle Xeloda. She is currently off, due to begin next cycle on 2/2. She had intermittent stomach cramps that began 5 days ago after eating an orange, gradually improving. Has some gas. No nausea or vomiting. 3 days ago she had 2 episodes of diarrhea that resolved on its own, did not require imodium. Po intake has slightly decreased this week due to cramps but otherwise she eats and drinks well. No mucositis. Hands and feet are red and mildly sore but improved on her week off treatment. Using generic hand lotion, has not used hydrocortisone yet. She otherwise has no specific complaints today.  REVIEW OF SYSTEMS:   Constitutional: Denies fevers, chills or abnormal weight loss  Eyes: Denies blurriness of vision Ears, nose, mouth, throat, and face: Denies mucositis or sore throat Respiratory: Denies cough, dyspnea or wheezes Cardiovascular: Denies palpitation, chest discomfort or lower extremity swelling Gastrointestinal:  Denies nausea, vomiting, constipation, diarrhea, heartburn or change in bowel habits (+) intermittent stomach cramp, improving  Skin: Denies abnormal skin rashes (+) hands and feet red and sore, improving  Lymphatics: Denies new lymphadenopathy or easy  bruising Neurological:Denies numbness, tingling or new weaknesses Behavioral/Psych: Mood is stable, no new changes  All other systems were reviewed with the patient and are negative.  MEDICAL HISTORY:  Past Medical History:  Diagnosis Date  . Essential hypertension   . GERD (gastroesophageal reflux disease)   . GI bleed   . High cholesterol   . Iron deficiency anemia     SURGICAL HISTORY: Past Surgical History:  Procedure Laterality Date  . COLONOSCOPY    . LAPAROSCOPIC SIGMOID COLECTOMY N/A 04/07/2017   Procedure: LAPAROSCOPIC SIGMOID COLECTOMY, ERAS PATHWAY, splenic flexure takedown;  Surgeon: ByerStark Durham;  [facility]: General;  Laterality: N/A;  . TONSILLECTOMY    . TOTAL HIP ARTHROPLASTY Right 2012    I have reviewed the social history and family history with the patient and they are unchanged from previous note.  ALLERGIES:  has No Known Allergies.  MEDICATIONS:  Current Outpatient Medications  Medication Sig Dispense Refill  . alendronate (FOSAMAX) 70 MG tablet Take 70 mg by mouth every 7 (seven) days.  11  . amLODipine (NORVASC) 10 MG tablet Take 10 mg by mouth daily. for blood pressure  1  . atorvastatin (LIPITOR) 10 MG tablet Take 10 mg by mouth daily.  3  . capecitabine (XELODA) 500 MG tablet Take 3 tabs (1500mg50m AM & 3 tab (1500mg)27mPM, within 30 minutes of meals, take for 14 days on, 7 days off, repeat every 21 days 84 tablet 2  . ferrous sulfate 325 (65 FE)  MG tablet Take 325 mg by mouth daily.  1  . pantoprazole (PROTONIX) 40 MG tablet Take 1 tablet (40 mg total) by mouth daily. 30 tablet 0  . potassium chloride SA (KLOR-CON M20) 20 MEQ tablet Take 1 tablet (20 mEq total) by mouth daily. 90 tablet 1  . ondansetron (ZOFRAN) 8 MG tablet Take 1 tablet (8 mg total) by mouth 2 (two) times daily as needed for refractory nausea / vomiting. Start on day 3 after chemotherapy. (Patient not taking: Reported on 06/23/2017) 30 tablet 1  .  prochlorperazine (COMPAZINE) 10 MG tablet Take 1 tablet (10 mg total) by mouth every 6 (six) hours as needed (Nausea or vomiting). (Patient not taking: Reported on 06/23/2017) 30 tablet 1   No current facility-administered medications for this visit.     PHYSICAL EXAMINATION: ECOG PERFORMANCE STATUS: 1 - Symptomatic but completely ambulatory  Vitals:   08/04/17 1021  BP: 120/66  Pulse: 62  Resp: 18  Temp: 97.6 F (36.4 C)  SpO2: 100%   Filed Weights   08/04/17 1021  Weight: 98 lb (44.5 kg)    GENERAL:alert, no distress and comfortable SKIN: skin color, texture, turgor are normal, no rashes or significant lesions (+) mild palmar erythema with dryness; no peeling or blisters EYES: normal, Conjunctiva are pink and non-injected, sclera clear OROPHARYNX:no exudate, no erythema and lips, buccal mucosa, and tongue normal  NECK: supple, thyroid normal size, non-tender, without nodularity LYMPH:  no palpable cervical or supraclavicular lymphadenopathy LUNGS: clear to auscultation bilaterally with normal breathing effort HEART: regular rate & rhythm and no murmurs and no lower extremity edema ABDOMEN:abdomen soft, non-tender and normal bowel sounds. No hepatomegaly  Musculoskeletal:no cyanosis of digits and no clubbing  NEURO: alert & oriented x 3 with fluent speech, no focal motor/sensory deficits  LABORATORY DATA:  I have reviewed the data as listed CBC Latest Ref Rng & Units 08/04/2017 07/15/2017 06/23/2017  WBC 3.9 - 10.3 K/uL 4.3 4.0 2.9(L)  Hemoglobin 11.6 - 15.9 g/dL 10.0(L) 11.6 11.1(L)  Hematocrit 34.8 - 46.6 % 29.0(L) 33.7(L) 32.5(L)  Platelets 145 - 400 K/uL 159 165 188     CMP Latest Ref Rng & Units 08/04/2017 07/15/2017 06/23/2017  Glucose 70 - 140 mg/dL 98 108 93  BUN 7 - 26 mg/dL 18 16 19.9  Creatinine 0.60 - 1.10 mg/dL 0.92 1.04 1.0  Sodium 136 - 145 mmol/L 140 141 140  Potassium 3.5 - 5.1 mmol/L 3.7 5.1(H) 3.7  Chloride 98 - 109 mmol/L 107 107 -  CO2 22 - 29  mmol/L '22 25 23  ' Calcium 8.4 - 10.4 mg/dL 9.2 9.8 9.8  Total Protein 6.4 - 8.3 g/dL 6.9 7.3 7.0  Total Bilirubin 0.2 - 1.2 mg/dL 1.2 1.0 0.56  Alkaline Phos 40 - 150 U/L 93 76 70  AST 5 - 34 U/L '20 20 20  ' ALT 0 - 55 U/L '10 20 12      ' RADIOGRAPHIC STUDIES: I have personally reviewed the radiological images as listed and agreed with the findings in the report. No results found.   ASSESSMENT & PLAN: Elizabeth Durham is a 77 y.o. female with a history of GI bleed, anemia, HTN, and sigmoid colon cancer    1. Cancer of Sigmoid Colon, invasive adenocarcinoma, grade 3, pT2, pN1b, cM0, stage IIIA -s/p cycle 4 Xeloda; due to poor tolerance oxaliplatin is on hold, she is tolerating 1500 mg BID 2 weeks on and 1 week off well overall; plan to complete 8 total cycles  then obtain restaging scan -she has mild palmar erythema and intermittent abdominal cramps today. I recommend she use hydrocortisone topical to hands 4x daily PRN, she has been using generic lotion until now -she relates her abdominal cramps are diet related, she will avoid oranges and acidic fruit; weight is stable -VS and weight stable; labs reviewed, adequate to begin cycle 5 single agent Xeloda 1500 mg BID days 1-14 of 21 day cycle  -CEA remains normal -return for lab and f/u in 3 weeks  2. Anemia, iron deficiency -Hgb slightly decreased today, Hgb 10.0; she is asymptomatic -iron studies are normal, continues oral iron supplement daily -she received 1 dose IV Feraheme 11/8 -will monitor closely  3. Hypokalemia -K is normal today, 3.7; was elevated to 5.1 at last office visit -she has periodic diarrhea, I suggest she take 1 tablet on days she has diarrhea, she agrees  4. Weight loss, anorexia -Her PO intake has improved since oxaliplatin is on hold; she does not like taste of boost/ensure -weight is stable, will monitor closely   PLAN -Labs reviewed, adequate to proceed with cycle 5 adjuvant Xeloda 1500 mg BID 2 weeks on/1  week off, begin 2/2 -Begin topical hydrocortisone to hands PRN -Take 1 tablet oral K on days with diarrhea -Return for lab and f/u in 3 weeks with next cycle  All questions were answered. The patient knows to call the clinic with any problems, questions or concerns. No barriers to learning was detected.    Elizabeth Feeling, NP 08/04/17

## 2017-08-04 NOTE — Telephone Encounter (Signed)
Scheduled appt per 1/31 los - Gave patient AVS and calender per los. 

## 2017-08-05 ENCOUNTER — Other Ambulatory Visit: Payer: Self-pay | Admitting: Hematology

## 2017-08-05 DIAGNOSIS — C187 Malignant neoplasm of sigmoid colon: Secondary | ICD-10-CM

## 2017-08-09 MED FILL — CAPECITABINE 500 MG TABLET: 500 | 21 days supply | Qty: 84 | Fill #1

## 2017-08-23 NOTE — Progress Notes (Signed)
[place]  [phone]) [phone] [phone]  Clinic Follow up Note   Patient Care Team: Elizabeth Bender, PA-C as PCP - General (Physician Assistant) Elizabeth Klein, MD as Consulting Physician (General Surgery) Elizabeth Merle, MD as Consulting Physician (Hematology) Elizabeth Feeling, NP as Nurse Practitioner (Nurse Practitioner)   Date of Service:  08/25/2017   CHIEF COMPLAINTS:  Follow up cancer of Sigmoid Colon   Oncology History   Cancer Staging Cancer of sigmoid colon [facility]) Staging form: Colon and Rectum, AJCC 8th Edition - Pathologic stage from 04/07/2017: Stage IIIA (pT2, pN1b, cM0) - Signed by Elizabeth Merle, MD on 04/26/2017       Cancer of sigmoid colon ([place])   03/22/2017 Procedure    Colonoscopy per Dr. Marian Durham A rectal examination was performed and was normal. The endoscope was then inserted into the rectum and advanced under direct visualization to about 25 cm where a large, near obstructing mass was noted. This had a malignant appearance. It was biopsied. That area could not be traversed with the adult colonoscope. Small amount of bleeding was noted from the biopsy site, but this stopped right away. 7 mm pedunculated polyp was noted at 15 cm. This was removed using hot snare and retrieved for pathology. Ink was injected into the submucosa at 20 cm (5 cm distal to the mass). The endoscope was then withdrawn and the procedure terminated.   Findings / Comments: - Mass at 25 cm. Biopsied. - Polyp removed as above.       04/07/2017 Initial Diagnosis    Cancer of sigmoid colon ([place])      04/07/2017 Pathology Results    Diagnosis Colon, segmental resection for tumor, Sigmoid - INVASIVE COLORECTAL ADENOCARCINOMA, 2.5 CM. - CARCINOMA INVADES MUSCULARIS PROPRIA. - MARGINS NOT INVOLVED. - METASTATIC CARCINOMA IN TWO OF EIGHTEEN LYMPH NODES (2/18) - PROXIMAL AND DISTAL ANASTOMOTIC RINGS FREE OF TUMOR.  Specimen: Sigmoid colon with proximal and distal  anastomotic rings. Procedure: Resection. Tumor site: Sigmoid colon. Specimen integrity: Intact. Macroscopic intactness of mesorectum: N/A Macroscopic tumor perforation: No. Invasive tumor: Maximum size: 2.5 cm. Histologic type(s): Colorectal adenocarcinoma. Histologic grade and differentiation: G3 G1: well differentiated/low grade G2: moderately differentiated/low grade G3: poorly differentiated/high grade G4: undifferentiated/high grade Type of polyp in which invasive carcinoma arose: No residual polyp. Microscopic extension of invasive tumor: Into muscularis propria. Lymph-Vascular invasion: Present. Peri-neural invasion: Present. Tumor deposit(s) (discontinuous extramural extension): No.  Resection margins: Proximal margin: Free of tumor. Distal margin: Free of tumor. Circumferential (radial) (posterior ascending, posterior descending; lateral and posterior mid-rectum; and entire lower 1/3 rectum): N/A Mesenteric margin (sigmoid and transverse): Free of tumor. Distance closest margin (if all above margins negative): 6 cm from mesenteric margin. Treatment effect (neo-adjuvant therapy): No. Additional polyp(s): No. Non-neoplastic findings: N/A Lymph nodes: number examined - 18; number positive: 2 Pathologic Staging: pT2, pN1b, pMX Ancillary studies: MSI by PCR and MMR by IHC. (JDP:gt, 04/11/17)      04/07/2017 Surgery    Laparoscopic sigmoid colectomy per Dr. Barry Durham      04/19/2017 Imaging    CT chest, abdomen and pelvis with contrast showed no definitive evidence of metastasis, numerous indeterminate subcentimeter hypodense lesions scattered throughout the liver, likely benign.      04/27/2017 Tumor Marker    CEA Pre-op 04/04/17: 1.4 Post-op 04/27/17: 1.27      05/09/2017 Imaging    MRI Abdomen IMPRESSION: 1. No evidence of metastatic colon cancer in the abdomen. 2. Multiple simple hepatic cysts. 3. Known cholelithiasis,  not well visualized. No biliary  dilatation.       05/12/2017 -  Chemotherapy    CAPOX q3weeks with Xeloda 1073m BID, 2 weeks on 1 week off. Starting 05/12/17. Xeloda held on 05/19/17 due to poor toleration. Proceed with single agent Xeloda 10044mBID starting with Cycle 2 on 06/04/17. Started cycle 3 Xeloda 1500 mg BID x14 days/7 days off on 06/25/17.      05/19/2017 Tumor Marker    CEA: 1.35      06/23/2017 Tumor Marker    CEA: 1.31        HISTORY OF PRESENTING ILLNESS: 04/27/17  [name]89.o. female is here because of newly diagnosed colon cancer. She presents to the clinic today accompanied by her daughter. She was referred by Dr. ByBarry DienesIn 12/2015 [facility] and acute blood loss anemia, she was also found to be iron deficient. Anemia improved with blood transfusion and she began taking oral iron supplementation. A colonoscopy was scheduled but ultimately cancelled because she could not tolerate prep. It was not rescheduled. She reports chronic constipation with intermittent blood on tissue after hard BM.  She previously lost weight but has gained it back. Colonoscopy was eventually rescheduled and performed on 03/22/2017 by Dr. BiMarian Sorrowo follow up on GI bleeding and because she had never had one. Colonoscopy revealed a malignant appearing near obstructing mass at about 25 cm, and a pedunculated polyp at 15 cm; both were biopsied. Colon mass pathology revealed scant fragments of intramucosal adenocarcinoma arising in an adenomatous polyp with high grade dysplasia; polyp biopsy revealed tubulovillous adeno suspicious for high grade dysplasia. She was referred to Dr. ByBarry Dienesor segmental resection on 04/07/17, pathology revealed invasive adenocarcinoma 2.5cm that invades the muscularis propria, margins were negative, with metastatic disease in 2 of 18 lymph nodes.   Today she is recovering well from surgery. She has good appetite. Continues to have constipation, has not used  medication; last BM 1 day ago. She denies pain, nausea, vomiting, blood in stool, or fatigue.  Patient is not currently working. She walks around the block and does yard work for exercise. She performs ADL's independently, lives with her brother and daughter; she is without a car, transportation is unreliable. Family has to borrow friend's car for rides.    CURRENT THERAPY: CAPOX q3weeks with Xeloda 100050mID, 2 weeks on 1 week off. Starting 05/12/17. Xeloda held on 05/19/17 due to poor toleration. Proceed with single agent Xeloda 1000m46mD starting with Cycle 2 on 06/04/17. Started cycle 3 Xeloda 1500 mg BID x14 days/7 days off on 06/25/17.   INTERVAL THERAPY:  Elizabeth CASTNERhere for a follow up for her sigmoid colon cancer. She presents to the clinic today accompanied by her daughter. She notes she is doing well overall. She is prepared to proceed with next cycle on 08/27/17.   On review of symptoms, pt notes dry hands with dark spots from Xeloda. She has tried OTC hydrocortisone. She has not used Urea cream before. She has a few cracks in her skin. Same condition for her feet. She notes for the past few days she lower abdominal pain. She denies diarrhea.     REVIEW OF SYSTEMS:   Constitutional: Denies fatigue, fevers, chills or abnormal night sweats  Eyes: Denies blurriness of vision, double vision or watery eyes Ears, nose, mouth, throat, and face: Denies mucositis or sore throat Respiratory: Denies cough, dyspnea or wheezes Cardiovascular:  Denies palpitation, chest discomfort  Gastrointestinal:  Denies nausea, vomiting, diarrhea, heartburn, blood in stool, abdominal pain, or change in bowel habits  Skin: (+) redness to bilateral palms of her hands with significant dry skin and cracking on palms. Same condition for her feet.   Lymphatics: Denies new lymphadenopathy or easy bruising Neurological:Denies numbness, tingling or new weaknesses Behavioral/Psych: Mood is stable, no new  changes  All other systems were reviewed with the patient and are negative.   MEDICAL HISTORY:  Past Medical History:  Diagnosis Date  . Essential hypertension   . GERD (gastroesophageal reflux disease)   . GI bleed   . High cholesterol   . Iron deficiency anemia     SURGICAL HISTORY: Past Surgical History:  Procedure Laterality Date  . COLONOSCOPY    . LAPAROSCOPIC SIGMOID COLECTOMY N/A 04/07/2017   Procedure: LAPAROSCOPIC SIGMOID COLECTOMY, ERAS PATHWAY, splenic flexure takedown;  Surgeon: Elizabeth Klein, MD;  Location: [facility];  Service: General;  Laterality: N/A;  . TONSILLECTOMY    . TOTAL HIP ARTHROPLASTY Right 2012    SOCIAL HISTORY: Social History   Socioeconomic History  . Marital status: Widowed    Spouse name: Not on file  . Number of children: Not on file  . Years of education: Not on file  . Highest education level: Not on file  Social Needs  . Financial resource strain: Not on file  . Food insecurity - worry: Not on file  . Food insecurity - inability: Not on file  . Transportation needs - medical: Not on file  . Transportation needs - non-medical: Not on file  Occupational History  . Not on file  Tobacco Use  . Smoking status: Former Smoker    Packs/day: 0.50    Years: 7.00    Pack years: 3.50    Types: Cigarettes    Last attempt to quit: 1983    Years since quitting: 36.1  . Smokeless tobacco: Never Used  Substance and Sexual Activity  . Alcohol use: No    Alcohol/week: 0.0 oz  . Drug use: No  . Sexual activity: Not on file  Other Topics Concern  . Not on file  Social History Narrative  . Not on file    FAMILY HISTORY: Family History  Problem Relation Age of Onset  . Leukemia Mother   . Cancer Father        unknown type  Negative family history for GI cancer; her father had unknown type of cancer   ALLERGIES:  has No Known Allergies.  MEDICATIONS:  Current Outpatient Medications  Medication Sig Dispense Refill  . alendronate  (FOSAMAX) 70 MG tablet Take 70 mg by mouth every 7 (seven) days.  11  . amLODipine (NORVASC) 10 MG tablet Take 10 mg by mouth daily. for blood pressure  1  . atorvastatin (LIPITOR) 10 MG tablet Take 10 mg by mouth daily.  3  . capecitabine (XELODA) 500 MG tablet Take 3 tabs (157m) in AM & 3 tab (15018m in PM, within 30 minutes of meals, take for 14 days on, 7 days off, repeat every 21 days 84 tablet 2  . ferrous sulfate 325 (65 FE) MG tablet Take 325 mg by mouth daily.  1  . ondansetron (ZOFRAN) 8 MG tablet Take 1 tablet (8 mg total) by mouth 2 (two) times daily as needed for refractory nausea / vomiting. Start on day 3 after chemotherapy. 30 tablet 1  . pantoprazole (PROTONIX) 40 MG tablet Take 1 tablet (40 mg  total) by mouth daily. 30 tablet 0  . prochlorperazine (COMPAZINE) 10 MG tablet Take 1 tablet (10 mg total) by mouth every 6 (six) hours as needed (Nausea or vomiting). 30 tablet 1  . urea (CARMOL) 10 % cream Apply topically 2 (two) times daily. Apply to palms twice daily 71 g 1  . potassium chloride SA (KLOR-CON M20) 20 MEQ tablet Take 1 tablet (20 mEq total) by mouth daily. (Patient not taking: Reported on 08/25/2017) 90 tablet 1   No current facility-administered medications for this visit.     PHYSICAL EXAMINATION: ECOG PERFORMANCE STATUS: 1 - Symptomatic but completely ambulatory  Vitals:   08/25/17 1111  BP: 115/67  Pulse: 81  Resp: 16  Temp: 97.6 F (36.4 C)  SpO2: 100%   Filed Weights   08/25/17 1111  Weight: 96 lb 1.6 oz (43.6 kg)     GENERAL:alert, no distress and comfortable SKIN: skin color, texture, turgor are normal, no rashes or significant lesions EYES: normal, conjunctiva are pink and non-injected, sclera clear OROPHARYNX:no exudate, no erythema and lips, buccal mucosa, and tongue normal  NECK: supple, thyroid normal size, non-tender, without nodularity LYMPH:  no palpable cervical, axillary, or inguinal lymphadenopathy  LUNGS: clear to auscultation  bilaterally with normal breathing effort HEART: regular rate & rhythm, no murmurs; trace bilateral lower extremity edema ABDOMEN:abdomen soft, non-tender and normal bowel sounds. No palpable hepatomegaly. 4 abdominal incisions and midline incision covered with steri strips, healing well, no erythema or drainage Musculoskeletal:no cyanosis of digits and no clubbing  PSYCH: alert & oriented x 3 with fluent speech NEURO: no focal motor/sensory deficits  LABORATORY DATA:  I have reviewed the data as listed CBC Latest Ref Rng & Units 08/25/2017 08/04/2017 07/15/2017  WBC 3.9 - 10.3 K/uL 5.1 4.3 4.0  Hemoglobin 11.6 - 15.9 g/dL 10.5(L) 10.0(L) 11.6  Hematocrit 34.8 - 46.6 % 29.9(L) 29.0(L) 33.7(L)  Platelets 145 - 400 K/uL 178 159 165    CMP Latest Ref Rng & Units 08/25/2017 08/04/2017 07/15/2017  Glucose 70 - 140 mg/dL 112 98 108  BUN 7 - 26 mg/dL _0 Creatinine 0.60 - 1.10 mg/dL 0.93 0.92 1.04  Sodium 136 - 145 mmol/L 141 140 141  Potassium 3.5 - 5.1 mmol/L 2.7(LL) 3.7 5.1(H)  Chloride 98 - 109 mmol/L 105 107 107  CO2 22 - 29 mmol/L _1 Calcium 8.4 - 10.4 mg/dL 9.0 9.2 9.8  Total Protein 6.4 - 8.3 g/dL 6.3(L) 6.9 7.3  Total Bilirubin 0.2 - 1.2 mg/dL 1.7(H) 1.2 1.0  Alkaline Phos 40 - 150 U/L 83 93 76  AST 5 - 34 U/L _2 ALT 0 - 55 U/L _3 RADIOGRAPHIC STUDIES: I have personally reviewed the radiological images as listed and agreed with the findings in the report. No results found.   MR Abdomen w/o contrast 05/09/17 IMPRESSION: 1. No evidence of metastatic colon cancer in the abdomen. 2. Multiple simple hepatic cysts. 3. Known cholelithiasis, not well visualized. No biliary dilatation.  CT CAP 04/19/17 IMPRESSION: 1. No definite evidence of metastatic disease in the chest, abdomen or pelvis. 2. Numerous indeterminate subcentimeter hypodense lesions scattered throughout the liver, too small to characterize, more likely benign. Recommend attention on follow-up  MRI (preferred) abdomen without and with IV contrast or CT abdomen with IV contrast in 3-6 months. This recommendation follows ACR consensus guidelines: Managing Incidental Findings on Abdominal CT: White Paper of the ACR Incidental Findings Committee. J  Am Coll Radiol 2010;7:754-773. 3. Nonspecific small volume free fluid in the pelvis, probably postsurgical. 4. Chronic findings include: Aortic Atherosclerosis (ICD10-I70.0). Cholelithiasis.  ASSESSMENT & PLAN:  Elizabeth Durham is a 77 y.o. female with a history of GI bleed, anemia, HTN, and sigmoid colon cancer    1. Cancer of Sigmoid Colon, invasive adenocarcinoma, grade 3, pT2 pN1b, cM0, stage IIIA -I previously discussed her colonoscopy, imaging, and surgical pathology findings in detail with the patient and family -Staging CT CAP shows no definite metastatic disease but does show numerous indeterminate sub-cm hypodense lesions scattered throughout the liver, too small to characterize but likely benign -MRI of abdomen on 05/09/17 revealed multiple non-enhancing hepatic cysts but no evidence of metastatic colon cancer in the abdomen -I reviewed her moderate risk for cancer recurrence after surgery -adjuvant chemotherapy is recommended, which is the standard care for stage III colon cancer. She is 75, but overall healthy and fit, no major medical comorbidities, has good performance status. Would be a candidate for chemo.  I discussed the option of FOLFOX q2 weeks vs CAPOX q3 weeks with Xeloda daily BID x2 weeks with 1 week off; the patient is interested in pursuing chemotherapy -I suspect she would tolerate FOLFOX better overall, but due to her transportation issues and lack of a car, she prefers CAPOX on q3 week schedule.  -On 05/12/17 she has started adjuvant CAPOX. We reduced Xeloda to 1051m BID, 2 weeks on and 1 week off and Oxaliplatin for cycle 1 due to elevated Cr and weight loss -She tolerated cycle 1 poorly with anorexia and dehydration,  she lost 8 pounds in 1 week.  -due to her poor tolerance to chemo, will hold Oxaliplatin, and continue Xeloda single agent. She has been tolerating for dose 1500 mg twice daily well. Plan for total of 8 cycles. -Her hand and foot syndrome has progressed. I will prescribe her Urea Cream today. She is otherwise tolerating treatment well overall.  -Labs reviewed with her, Hg 10.5, otherwise unremarkable   -She will continue treatment with cycle 6 of Xeloda this week.  Patient is little reluctant to have a total of 8 cycles, would like to stop after next cycle. Will see how she does with this cycle  -I plan to repeat her CT scans for restaging after she completes adjuvant Xeloda. -F/u in 3 weeks    2. Anemia, iron deficiency  -she has been iron deficient in the past, on oral ferrous sulfate 1 tab daily -She received IV Feraheme on 11/8 for iron deficiency anemia and counts recovered. Has not needed more IV Feraheme since.  -HG slightly lowered to 10.5 today (08/25/2017)  -Will continue oral ferrous sulfate once daily.    3. Hypokalemia  -she takes oral K 10 mEq PRN with lasix  -I recommend she take 20 mEq of oral K daily, increased to BID on 05/12/17  -Potassium normalized and diarrhea resolved currently. -For any episodic diarrhea, it was suggested she use 1 tab of oral K on days she has diarrhea. She agrees.   4. Weight loss, anorexia  - I have encouraged her to eat whatever she wants in order to gain weight, dietary referral was placed -Worsened with chemotherapy. Dietician will do close follow up until weight is stable.  -Her PO intake has improved since Oxaliplatin is on hold; she does not like taste of boost/ensure -weight is slightly trending down, but overall holding up well, will monitor closely    5. Hand and Foot Syndrome, secondary  to Xeloda -Will prescribe urea cream today (08/25/17) and continue use of OTC hydrocortisone cream with moisturizer.     PLAN: -Prescribe Urea  Cream today  -She is tolerating the Xeloda well, will start cycle 6 Xeloda 1500 mg bid 14 days on and 7 days off on 2/23 -Lab and f/u in 3 weeks, to start cycle 7, total of 8 cycles.     No orders of the defined types were placed in this encounter.   All questions were answered. The patient knows to call the clinic with any problems, questions or concerns.   Elizabeth Durham  08/25/2017   This document serves as a record of services personally performed by Elizabeth Merle, MD. It was created on her behalf by Joslyn Devon, a trained medical scribe. The creation of this record is based on the scribe's personal observations and the provider's statements to them.    I have reviewed the above documentation for accuracy and completeness, and I agree with the above.

## 2017-08-25 ENCOUNTER — Inpatient Hospital Stay: Payer: Medicare HMO

## 2017-08-25 ENCOUNTER — Inpatient Hospital Stay: Payer: Medicare HMO | Attending: Hematology | Admitting: Hematology

## 2017-08-25 ENCOUNTER — Telehealth: Payer: Self-pay | Admitting: *Deleted

## 2017-08-25 VITALS — BP 115/67 | HR 81 | Temp 97.6°F | Resp 16 | Ht 64.0 in | Wt 96.1 lb

## 2017-08-25 DIAGNOSIS — Z8719 Personal history of other diseases of the digestive system: Secondary | ICD-10-CM | POA: Insufficient documentation

## 2017-08-25 DIAGNOSIS — D500 Iron deficiency anemia secondary to blood loss (chronic): Secondary | ICD-10-CM | POA: Diagnosis not present

## 2017-08-25 DIAGNOSIS — C187 Malignant neoplasm of sigmoid colon: Secondary | ICD-10-CM | POA: Insufficient documentation

## 2017-08-25 DIAGNOSIS — I10 Essential (primary) hypertension: Secondary | ICD-10-CM | POA: Diagnosis not present

## 2017-08-25 DIAGNOSIS — E876 Hypokalemia: Secondary | ICD-10-CM | POA: Diagnosis not present

## 2017-08-25 DIAGNOSIS — C779 Secondary and unspecified malignant neoplasm of lymph node, unspecified: Secondary | ICD-10-CM | POA: Insufficient documentation

## 2017-08-25 DIAGNOSIS — Z87891 Personal history of nicotine dependence: Secondary | ICD-10-CM

## 2017-08-25 DIAGNOSIS — Z809 Family history of malignant neoplasm, unspecified: Secondary | ICD-10-CM | POA: Insufficient documentation

## 2017-08-25 DIAGNOSIS — K219 Gastro-esophageal reflux disease without esophagitis: Secondary | ICD-10-CM | POA: Diagnosis not present

## 2017-08-25 DIAGNOSIS — I700 Atherosclerosis of aorta: Secondary | ICD-10-CM | POA: Diagnosis not present

## 2017-08-25 DIAGNOSIS — R1030 Lower abdominal pain, unspecified: Secondary | ICD-10-CM | POA: Insufficient documentation

## 2017-08-25 DIAGNOSIS — Z806 Family history of leukemia: Secondary | ICD-10-CM | POA: Diagnosis not present

## 2017-08-25 DIAGNOSIS — E7800 Pure hypercholesterolemia, unspecified: Secondary | ICD-10-CM | POA: Diagnosis not present

## 2017-08-25 DIAGNOSIS — Z9221 Personal history of antineoplastic chemotherapy: Secondary | ICD-10-CM | POA: Diagnosis not present

## 2017-08-25 DIAGNOSIS — D509 Iron deficiency anemia, unspecified: Secondary | ICD-10-CM | POA: Insufficient documentation

## 2017-08-25 DIAGNOSIS — R634 Abnormal weight loss: Secondary | ICD-10-CM | POA: Diagnosis not present

## 2017-08-25 DIAGNOSIS — R630 Anorexia: Secondary | ICD-10-CM | POA: Insufficient documentation

## 2017-08-25 DIAGNOSIS — Z79899 Other long term (current) drug therapy: Secondary | ICD-10-CM | POA: Diagnosis not present

## 2017-08-25 DIAGNOSIS — K5900 Constipation, unspecified: Secondary | ICD-10-CM

## 2017-08-25 LAB — IRON AND TIBC
Iron: 86 ug/dL (ref 41–142)
SATURATION RATIOS: 27 % (ref 21–57)
TIBC: 318 ug/dL (ref 236–444)
UIBC: 232 ug/dL

## 2017-08-25 LAB — CBC WITH DIFFERENTIAL/PLATELET
BASOS ABS: 0 10*3/uL (ref 0.0–0.1)
BASOS PCT: 0 %
Eosinophils Absolute: 0.3 10*3/uL (ref 0.0–0.5)
Eosinophils Relative: 6 %
HEMATOCRIT: 29.9 % — AB (ref 34.8–46.6)
Hemoglobin: 10.5 g/dL — ABNORMAL LOW (ref 11.6–15.9)
Lymphocytes Relative: 11 %
Lymphs Abs: 0.6 10*3/uL — ABNORMAL LOW (ref 0.9–3.3)
MCH: 35.5 pg — ABNORMAL HIGH (ref 25.1–34.0)
MCHC: 35.1 g/dL (ref 31.5–36.0)
MCV: 101 fL (ref 79.5–101.0)
MONO ABS: 0.7 10*3/uL (ref 0.1–0.9)
MONOS PCT: 14 %
NEUTROS ABS: 3.6 10*3/uL (ref 1.5–6.5)
Neutrophils Relative %: 69 %
Platelets: 178 10*3/uL (ref 145–400)
RBC: 2.96 MIL/uL — ABNORMAL LOW (ref 3.70–5.45)
RDW: 19.4 % — AB (ref 11.2–14.5)
WBC: 5.1 10*3/uL (ref 3.9–10.3)

## 2017-08-25 LAB — COMPREHENSIVE METABOLIC PANEL
ALBUMIN: 3.9 g/dL (ref 3.5–5.0)
ALT: 10 U/L (ref 0–55)
ANION GAP: 11 (ref 3–11)
AST: 16 U/L (ref 5–34)
Alkaline Phosphatase: 83 U/L (ref 40–150)
BUN: 19 mg/dL (ref 7–26)
CALCIUM: 9 mg/dL (ref 8.4–10.4)
CO2: 25 mmol/L (ref 22–29)
Chloride: 105 mmol/L (ref 98–109)
Creatinine, Ser: 0.93 mg/dL (ref 0.60–1.10)
GFR calc Af Amer: >60 mL/min (ref >60)
GFR calc non Af Amer: 59 mL/min — ABNORMAL LOW (ref >60)
GLUCOSE: 112 mg/dL (ref 70–140)
POTASSIUM: 2.7 mmol/L — AB (ref 3.5–5.1)
SODIUM: 141 mmol/L (ref 136–145)
Total Bilirubin: 1.7 mg/dL — ABNORMAL HIGH (ref 0.2–1.2)
Total Protein: 6.3 g/dL — ABNORMAL LOW (ref 6.4–8.3)

## 2017-08-25 LAB — FERRITIN: FERRITIN: 214 ng/mL (ref 9–269)

## 2017-08-25 LAB — CEA (IN HOUSE-CHCC): CEA (CHCC-IN HOUSE): 2.27 ng/mL (ref 0.00–5.00)

## 2017-08-25 MED ORDER — UREA 10 % EX CREA: TOPICAL_CREAM | Freq: Two times a day (BID) | CUTANEOUS | 1 refills | Status: DC

## 2017-08-25 NOTE — Telephone Encounter (Signed)
K+   2.7  Today.   Spoke with pt and instructed pt to start taking Kdur  40 meq daily for  5  Days -  Starting today,  Then take 20 meq daily until next office visit.  Went over dietary suggestions with pt for foods high in potassium.   Pt voiced understanding.

## 2017-08-26 MED FILL — CAPECITABINE 500 MG TABLET: 500 | 21 days supply | Qty: 84 | Fill #2

## 2017-08-27 ENCOUNTER — Encounter: Payer: Self-pay | Admitting: Hematology

## 2017-08-31 ENCOUNTER — Other Ambulatory Visit: Payer: Self-pay | Admitting: *Deleted

## 2017-08-31 MED ORDER — AMMONIUM LACTATE 12 % EX CREA: TOPICAL_CREAM | CUTANEOUS | 0 refills | Status: DC | PRN

## 2017-09-15 ENCOUNTER — Encounter: Payer: Self-pay | Admitting: Nurse Practitioner

## 2017-09-15 ENCOUNTER — Inpatient Hospital Stay: Payer: Medicare HMO | Attending: Hematology | Admitting: Nurse Practitioner

## 2017-09-15 ENCOUNTER — Telehealth: Payer: Self-pay | Admitting: Nurse Practitioner

## 2017-09-15 ENCOUNTER — Inpatient Hospital Stay: Payer: Medicare HMO

## 2017-09-15 VITALS — BP 117/64 | HR 66 | Temp 98.0°F | Resp 16 | Ht 64.0 in | Wt 96.2 lb

## 2017-09-15 DIAGNOSIS — T451X5S Adverse effect of antineoplastic and immunosuppressive drugs, sequela: Secondary | ICD-10-CM | POA: Diagnosis not present

## 2017-09-15 DIAGNOSIS — Z9221 Personal history of antineoplastic chemotherapy: Secondary | ICD-10-CM | POA: Insufficient documentation

## 2017-09-15 DIAGNOSIS — C7989 Secondary malignant neoplasm of other specified sites: Secondary | ICD-10-CM | POA: Diagnosis not present

## 2017-09-15 DIAGNOSIS — L03039 Cellulitis of unspecified toe: Secondary | ICD-10-CM

## 2017-09-15 DIAGNOSIS — E876 Hypokalemia: Secondary | ICD-10-CM | POA: Diagnosis not present

## 2017-09-15 DIAGNOSIS — C187 Malignant neoplasm of sigmoid colon: Secondary | ICD-10-CM | POA: Diagnosis not present

## 2017-09-15 DIAGNOSIS — Z79899 Other long term (current) drug therapy: Secondary | ICD-10-CM | POA: Diagnosis not present

## 2017-09-15 DIAGNOSIS — E7800 Pure hypercholesterolemia, unspecified: Secondary | ICD-10-CM | POA: Insufficient documentation

## 2017-09-15 DIAGNOSIS — Z8719 Personal history of other diseases of the digestive system: Secondary | ICD-10-CM | POA: Insufficient documentation

## 2017-09-15 DIAGNOSIS — R630 Anorexia: Secondary | ICD-10-CM | POA: Insufficient documentation

## 2017-09-15 DIAGNOSIS — K219 Gastro-esophageal reflux disease without esophagitis: Secondary | ICD-10-CM | POA: Diagnosis not present

## 2017-09-15 DIAGNOSIS — R634 Abnormal weight loss: Secondary | ICD-10-CM | POA: Diagnosis not present

## 2017-09-15 DIAGNOSIS — Z87891 Personal history of nicotine dependence: Secondary | ICD-10-CM | POA: Diagnosis not present

## 2017-09-15 DIAGNOSIS — I10 Essential (primary) hypertension: Secondary | ICD-10-CM | POA: Insufficient documentation

## 2017-09-15 DIAGNOSIS — L271 Localized skin eruption due to drugs and medicaments taken internally: Secondary | ICD-10-CM | POA: Diagnosis not present

## 2017-09-15 DIAGNOSIS — D509 Iron deficiency anemia, unspecified: Secondary | ICD-10-CM | POA: Diagnosis not present

## 2017-09-15 LAB — COMPREHENSIVE METABOLIC PANEL
ALT: 13 U/L (ref 0–55)
AST: 18 U/L (ref 5–34)
Albumin: 4 g/dL (ref 3.5–5.0)
Alkaline Phosphatase: 81 U/L (ref 40–150)
Anion gap: 9 (ref 3–11)
BUN: 17 mg/dL (ref 7–26)
CALCIUM: 9.6 mg/dL (ref 8.4–10.4)
CHLORIDE: 107 mmol/L (ref 98–109)
CO2: 25 mmol/L (ref 22–29)
CREATININE: 0.93 mg/dL (ref 0.60–1.10)
GFR calc Af Amer: >60 mL/min (ref >60)
GFR, EST NON AFRICAN AMERICAN: 59 mL/min — AB (ref >60)
Glucose, Bld: 102 mg/dL (ref 70–140)
Potassium: 3.6 mmol/L (ref 3.5–5.1)
SODIUM: 141 mmol/L (ref 136–145)
Total Bilirubin: 1.3 mg/dL — ABNORMAL HIGH (ref 0.2–1.2)
Total Protein: 6.6 g/dL (ref 6.4–8.3)

## 2017-09-15 LAB — CBC WITH DIFFERENTIAL/PLATELET
BASOS PCT: 0 %
Basophils Absolute: 0 10*3/uL (ref 0.0–0.1)
EOS ABS: 0.3 10*3/uL (ref 0.0–0.5)
EOS PCT: 6 %
HCT: 28.5 % — ABNORMAL LOW (ref 34.8–46.6)
Hemoglobin: 9.9 g/dL — ABNORMAL LOW (ref 11.6–15.9)
LYMPHS ABS: 0.5 10*3/uL — AB (ref 0.9–3.3)
Lymphocytes Relative: 10 %
MCH: 36.9 pg — AB (ref 25.1–34.0)
MCHC: 34.7 g/dL (ref 31.5–36.0)
MCV: 106.3 fL — ABNORMAL HIGH (ref 79.5–101.0)
Monocytes Absolute: 0.8 10*3/uL (ref 0.1–0.9)
Monocytes Relative: 16 %
Neutro Abs: 3.4 10*3/uL (ref 1.5–6.5)
Neutrophils Relative %: 68 %
PLATELETS: 173 10*3/uL (ref 145–400)
RBC: 2.68 MIL/uL — ABNORMAL LOW (ref 3.70–5.45)
RDW: 18.9 % — AB (ref 11.2–14.5)
WBC: 5 10*3/uL (ref 3.9–10.3)

## 2017-09-15 LAB — IRON AND TIBC
IRON: 66 ug/dL (ref 41–142)
Saturation Ratios: 19 % — ABNORMAL LOW (ref 21–57)
TIBC: 344 ug/dL (ref 236–444)
UIBC: 278 ug/dL

## 2017-09-15 LAB — FERRITIN: FERRITIN: 185 ng/mL (ref 9–269)

## 2017-09-15 LAB — CEA (IN HOUSE-CHCC): CEA (CHCC-In House): 3.77 ng/mL (ref 0.00–5.00)

## 2017-09-15 MED ORDER — CEPHALEXIN 500 MG PO CAPS: 500.0000 mg | ORAL_CAPSULE | Freq: Three times a day (TID) | ORAL | 0 refills | Status: DC

## 2017-09-15 NOTE — Progress Notes (Signed)
Elizabeth Durham  [phone] Fax:([phone]) [phone]  Clinic Follow up Note   Patient Care Team: Cyndi Bender, PA-C as PCP - General (Physician Assistant) [name], MD as Consulting Physician (General Surgery) Truitt Merle, MD as Consulting Physician (Hematology) Alla Feeling, NP as Nurse Practitioner (Nurse Practitioner) 09/15/2017  SUMMARY OF ONCOLOGIC HISTORY: Oncology History   Cancer Staging Cancer of sigmoid colon [facility]) Staging form: Colon and Rectum, AJCC 8th Edition - Pathologic stage from 04/07/2017: Stage IIIA (pT2, pN1b, cM0) - Signed by Truitt Merle, MD on 04/26/2017       Cancer of sigmoid colon ([place])   03/22/2017 Procedure    Colonoscopy per Dr. Marian Sorrow A rectal examination was performed and was normal. The endoscope was then inserted into the rectum and advanced under direct visualization to about 25 cm where a large, near obstructing mass was noted. This had a malignant appearance. It was biopsied. That area could not be traversed with the adult colonoscope. Small amount of bleeding was noted from the biopsy site, but this stopped right away. 7 mm pedunculated polyp was noted at 15 cm. This was removed using hot snare and retrieved for pathology. Ink was injected into the submucosa at 20 cm (5 cm distal to the mass). The endoscope was then withdrawn and the procedure terminated.   Findings / Comments: - Mass at 25 cm. Biopsied. - Polyp removed as above.       04/07/2017 Initial Diagnosis    Cancer of sigmoid colon ([place])      04/07/2017 Pathology Results    Diagnosis Colon, segmental resection for tumor, Sigmoid - INVASIVE COLORECTAL ADENOCARCINOMA, 2.5 CM. - CARCINOMA INVADES MUSCULARIS PROPRIA. - MARGINS NOT INVOLVED. - METASTATIC CARCINOMA IN TWO OF EIGHTEEN LYMPH NODES (2/18) - PROXIMAL AND DISTAL ANASTOMOTIC RINGS FREE OF TUMOR.  Specimen: Sigmoid colon with proximal and distal anastomotic rings. Procedure: Resection. Tumor site:  Sigmoid colon. Specimen integrity: Intact. Macroscopic intactness of mesorectum: N/A Macroscopic tumor perforation: No. Invasive tumor: Maximum size: 2.5 cm. Histologic type(s): Colorectal adenocarcinoma. Histologic grade and differentiation: G3 G1: well differentiated/low grade G2: moderately differentiated/low grade G3: poorly differentiated/high grade G4: undifferentiated/high grade Type of polyp in which invasive carcinoma arose: No residual polyp. Microscopic extension of invasive tumor: Into muscularis propria. Lymph-Vascular invasion: Present. Peri-neural invasion: Present. Tumor deposit(s) (discontinuous extramural extension): No.  Resection margins: Proximal margin: Free of tumor. Distal margin: Free of tumor. Circumferential (radial) (posterior ascending, posterior descending; lateral and posterior mid-rectum; and entire lower 1/3 rectum): N/A Mesenteric margin (sigmoid and transverse): Free of tumor. Distance closest margin (if all above margins negative): 6 cm from mesenteric margin. Treatment effect (neo-adjuvant therapy): No. Additional polyp(s): No. Non-neoplastic findings: N/A Lymph nodes: number examined - 18; number positive: 2 Pathologic Staging: pT2, pN1b, pMX Ancillary studies: MSI by PCR and MMR by IHC. (JDP:gt, 04/11/17)      04/07/2017 Surgery    Laparoscopic sigmoid colectomy per Dr. Barry Dienes      04/19/2017 Imaging    CT chest, abdomen and pelvis with contrast showed no definitive evidence of metastasis, numerous indeterminate subcentimeter hypodense lesions scattered throughout the liver, likely benign.      04/27/2017 Tumor Marker    CEA Pre-op 04/04/17: 1.4 Post-op 04/27/17: 1.27      05/09/2017 Imaging    MRI Abdomen IMPRESSION: 1. No evidence of metastatic colon cancer in the abdomen. 2. Multiple simple hepatic cysts. 3. Known cholelithiasis, not well visualized. No biliary dilatation.       05/12/2017 -  Chemotherapy    CAPOX q3weeks  with Xeloda 1010m BID, 2 weeks on 1 week off. Starting 05/12/17. Xeloda held on 05/19/17 due to poor toleration. Proceed with single agent Xeloda 10036mBID starting with Cycle 2 on 06/04/17. Started cycle 3 Xeloda 1500 mg BID x14 days/7 days off on 06/25/17.      05/19/2017 Tumor Marker    CEA: 1.35      06/23/2017 Tumor Marker    CEA: 1.31     CURRENT THERAPY: CAPOX q3weeks with Xeloda 10001mID, 2 weeks on 1 week off. Starting 05/12/17. Xeloda held on 05/19/17 due to poor toleration. Proceed with single agent Xeloda 1000m31mD starting with Cycle 2 on 06/04/17. Started cycle 3 Xeloda 1500 mg BID x14 days/7 days off on 06/25/17. Cycle 7 held on 09/15/17 for grade 3 hand-foot syndrome  INTERVAL HISTORY: Elizabeth Durham for follow up as scheduled prior to cycle 7 adjuvant Xeloda. She completed cycle 6 Xeloda 1500 mg BID x2 weeks on/1 week off on 09/09/17, due to begin next cycle of tablets 09/18/17. 1 week ago on the last couple days of her pills hands and feet became very red, peeling, dry, cracked and sore. There is some drainage from her toes. Having difficulty opening drinks and functioning in general. Few days ago it became painful to walk. Insurance would not approve urea cream to she was prescribed ammonium lactate cream. Otherwise she is doing well, mild fatigue. Good appetite and po intake. No nausea, vomiting, constipation, diarrhea, or mucositis. Denies dysuria or hematuria. No fever, chills, cough, dyspnea, or chest pain.    MEDICAL HISTORY:  Past Medical History:  Diagnosis Date  . Essential hypertension   . GERD (gastroesophageal reflux disease)   . GI bleed   . High cholesterol   . Iron deficiency anemia     SURGICAL HISTORY: Past Surgical History:  Procedure Laterality Date  . COLONOSCOPY    . LAPAROSCOPIC SIGMOID COLECTOMY N/A 04/07/2017   Procedure: LAPAROSCOPIC SIGMOID COLECTOMY, ERAS PATHWAY, splenic flexure takedown;  Surgeon: ByerStark Klein;  [facility];  Laterality: N/A;  . TONSILLECTOMY    . TOTAL HIP ARTHROPLASTY Right 2012    I have reviewed the social history and family history with the patient and they are unchanged from previous note.  ALLERGIES:  has No Known Allergies.  MEDICATIONS:  Current Outpatient Medications  Medication Sig Dispense Refill  . alendronate (FOSAMAX) 70 MG tablet Take 70 mg by mouth every 7 (seven) days.  11  . amLODipine (NORVASC) 10 MG tablet Take 10 mg by mouth daily. for blood pressure  1  . ammonium lactate (AMLACTIN) 12 % cream Apply topically as needed for dry skin. 385 g 0  . atorvastatin (LIPITOR) 10 MG tablet Take 10 mg by mouth daily.  3  . capecitabine (XELODA) 500 MG tablet Take 3 tabs (1500mg21m AM & 3 tab (1500mg)49mPM, within 30 minutes of meals, take for 14 days on, 7 days off, repeat every 21 days 84 tablet 2  . ferrous sulfate 325 (65 FE) MG tablet Take 325 mg by mouth daily.  1  . pantoprazole (PROTONIX) 40 MG tablet Take 1 tablet (40 mg total) by mouth daily. 30 tablet 0  . potassium chloride SA (KLOR-CON M20) 20 MEQ tablet Take 1 tablet (20 mEq total) by mouth daily. 90 tablet 1  . cephALEXin (KEFLEX) 500 MG capsule Take 1 capsule (500 mg total) by mouth 3 (three) times daily.  21 capsule 0  . ondansetron (ZOFRAN) 8 MG tablet Take 1 tablet (8 mg total) by mouth 2 (two) times daily as needed for refractory nausea / vomiting. Start on day 3 after chemotherapy. 30 tablet 1  . prochlorperazine (COMPAZINE) 10 MG tablet Take 1 tablet (10 mg total) by mouth every 6 (six) hours as needed (Nausea or vomiting). 30 tablet 1  . urea (CARMOL) 10 % cream Apply topically 2 (two) times daily. Apply to palms twice daily 71 g 1   No current facility-administered medications for this visit.     PHYSICAL EXAMINATION: ECOG PERFORMANCE STATUS: 2 - Symptomatic, <50% confined to bed  Vitals:   09/15/17 1000  BP: 117/64  Pulse: 66  Resp: 16  Temp: 98 F (36.7 C)  SpO2: 98%   Filed  Weights   09/15/17 1000  Weight: 96 lb 3.2 oz (43.6 kg)    GENERAL:alert, no distress and comfortable SKIN: skin color, texture, turgor are normal, no rashes or significant lesions (+) severe palmar-plantar erythema with edematous dry peeling cracked skin. Paronychia to left toe with purulent drainage. Toes of right foot with serosanguinous drainage  EYES: normal, Conjunctiva are pink and non-injected, sclera clear OROPHARYNX:no exudate, no erythema and lips, buccal mucosa, and tongue normal  LYMPH:  no palpable cervical, supraclavicular, axillary, or inguinal lymphadenopathy LUNGS: clear to auscultation bilaterally with normal breathing effort HEART: regular rate & rhythm and no murmurs (+) trace pedal edema ABDOMEN:abdomen soft, non-tender and normal bowel sounds. Abdominal incisions are well-healed. Musculoskeletal:no cyanosis of digits and no clubbing  NEURO: alert & oriented x 3 with fluent speech, no focal motor/sensory deficits  LABORATORY DATA:  I have reviewed the data as listed CBC Latest Ref Rng & Units 09/15/2017 08/25/2017 08/04/2017  WBC 3.9 - 10.3 K/uL 5.0 5.1 4.3  Hemoglobin 11.6 - 15.9 g/dL 9.9(L) 10.5(L) 10.0(L)  Hematocrit 34.8 - 46.6 % 28.5(L) 29.9(L) 29.0(L)  Platelets 145 - 400 K/uL 173 178 159     CMP Latest Ref Rng & Units 09/15/2017 08/25/2017 08/04/2017  Glucose 70 - 140 mg/dL 102 112 98  BUN 7 - 26 mg/dL _0 Creatinine 0.60 - 1.10 mg/dL 0.93 0.93 0.92  Sodium 136 - 145 mmol/L 141 141 140  Potassium 3.5 - 5.1 mmol/L 3.6 2.7(LL) 3.7  Chloride 98 - 109 mmol/L 107 105 107  CO2 22 - 29 mmol/L _1 Calcium 8.4 - 10.4 mg/dL 9.6 9.0 9.2  Total Protein 6.4 - 8.3 g/dL 6.6 6.3(L) 6.9  Total Bilirubin 0.2 - 1.2 mg/dL 1.3(H) 1.7(H) 1.2  Alkaline Phos 40 - 150 U/L 81 83 93  AST 5 - 34 U/L _2 ALT 0 - 55 U/L _3 RADIOGRAPHIC STUDIES: I have personally reviewed the radiological images as listed and agreed with the findings in the  report. No results found.   ASSESSMENT & PLAN: Elizabeth Durham is a 77 y.o. female with a history of GI bleed, anemia, HTN, and sigmoid colon cancer   1. Cancer of Sigmoid Colon, invasive adenocarcinoma, grade 3, pT2 pN1b, cM0, stage IIIA -Ms Molinari appears stable. She completed 6 cycles adjuvant Xeldoa. She has tolerated well. However, she developed grade 3 hand-foot syndrome with cycle 6. Will hold treatment for 2 weeks. She can afford urea cream without insurance, she will buy it today. Will f/u in 2 weeks to monitor her condition and likely proceed with cycle 7 with dose reduction. She  does want to continue and complete the recommended duration of therapy, which is 8 cycles  Will obtain restaging scan after she completes adjuvant xeloda.   2. Hand- foot syndrome, secondary to Xeloda  -She developed grade 3 hand-foot syndrome after cycle 6. She will begin urea cream today. Will hold cycle 7. Consider dose reduction or altered dosing schedule such as 1 week on/1 week off when therapy resumes.   3. Paronychia, secondary to Xeloda  -She has paronychia of left toenails with purulent drainage. I prescribed keflex for her to begin today and recommended vinegar and water toenail soaks    4. Anemia, iron deficiency -Iron studies are adequate lately, Hgb slightly decreased to 9.9 today. Continue oral iron 1 tab daily. Will monitor.  5. Hypokalemia -K 3.6 on 1 tablet daily. Will continue oral potassium supplement for now.   6. Weight loss, anorexia -Her weight is stable lately; she eats small frequent amounts during the day. Refuses supplements such as boost/ensure or carnation breakfast. I encouraged her to intake foods high in calories and protein to gain weight. She agrees.    PLAN: -Hold cycle 7 Xeloda for grade 3 hand-foot syndrome -Begin Keflex for paronychia, prescription sent -Urea for hand-foot syndrome, reviewed cost w/o insurance with patient, she will buy today -F/u in 2 weeks to  monitor and discuss resuming chemotherapy    Orders Placed This Encounter  Procedures  . CBC with Differential/Platelet   All questions were answered. The patient knows to call the clinic with any problems, questions or concerns. No barriers to learning was detected. I spent 20 minutes counseling the patient face to face. The total time spent in the appointment was 25 minutes and more than 50% was on counseling and review of test results.     Alla Feeling, NP 09/15/17

## 2017-09-15 NOTE — Telephone Encounter (Signed)
Scheduled appt per 3/14 los - Gave patient AVS and calender per los.  

## 2017-09-26 DIAGNOSIS — C187 Malignant neoplasm of sigmoid colon: Secondary | ICD-10-CM | POA: Diagnosis not present

## 2017-09-26 DIAGNOSIS — E46 Unspecified protein-calorie malnutrition: Secondary | ICD-10-CM | POA: Diagnosis not present

## 2017-09-28 NOTE — Progress Notes (Signed)
[place]  [phone] [phone]  Clinic Follow up Note   Patient Care Team: Elizabeth Bender, PA-C as PCP - General (Physician Assistant) Elizabeth Klein, MD as Consulting Physician (General Surgery) Elizabeth Merle, MD as Consulting Physician (Hematology) Elizabeth Feeling, NP as Nurse Practitioner (Nurse Practitioner)   Date of Service:  09/30/2017   CHIEF COMPLAINTS:  Follow up cancer of Sigmoid Colon   Oncology History   Cancer Staging Cancer of sigmoid colon [facility]) Staging form: Colon and Rectum, AJCC 8th Edition - Pathologic stage from 04/07/2017: Stage IIIA (pT2, pN1b, cM0) - Signed by Elizabeth Merle, MD on 04/26/2017       Cancer of sigmoid colon ([place])   03/22/2017 Procedure    Colonoscopy per Dr. Marian Durham A rectal examination was performed and was normal. The endoscope was then inserted into the rectum and advanced under direct visualization to about 25 cm where a large, near obstructing mass was noted. This had a malignant appearance. It was biopsied. That area could not be traversed with the adult colonoscope. Small amount of bleeding was noted from the biopsy site, but this stopped right away. 7 mm pedunculated polyp was noted at 15 cm. This was removed using hot snare and retrieved for pathology. Ink was injected into the submucosa at 20 cm (5 cm distal to the mass). The endoscope was then withdrawn and the procedure terminated.   Findings / Comments: - Mass at 25 cm. Biopsied. - Polyp removed as above.       04/07/2017 Initial Diagnosis    Cancer of sigmoid colon ([place])      04/07/2017 Pathology Results    Diagnosis Colon, segmental resection for tumor, Sigmoid - INVASIVE COLORECTAL ADENOCARCINOMA, 2.5 CM. - CARCINOMA INVADES MUSCULARIS PROPRIA. - MARGINS NOT INVOLVED. - METASTATIC CARCINOMA IN TWO OF EIGHTEEN LYMPH NODES (2/18) - PROXIMAL AND DISTAL ANASTOMOTIC RINGS FREE OF TUMOR.  Specimen: Sigmoid colon with proximal and distal  anastomotic rings. Procedure: Resection. Tumor site: Sigmoid colon. Specimen integrity: Intact. Macroscopic intactness of mesorectum: N/A Macroscopic tumor perforation: No. Invasive tumor: Maximum size: 2.5 cm. Histologic type(s): Colorectal adenocarcinoma. Histologic grade and differentiation: G3 G1: well differentiated/low grade G2: moderately differentiated/low grade G3: poorly differentiated/high grade G4: undifferentiated/high grade Type of polyp in which invasive carcinoma arose: No residual polyp. Microscopic extension of invasive tumor: Into muscularis propria. Lymph-Vascular invasion: Present. Peri-neural invasion: Present. Tumor deposit(s) (discontinuous extramural extension): No.  Resection margins: Proximal margin: Free of tumor. Distal margin: Free of tumor. Circumferential (radial) (posterior ascending, posterior descending; lateral and posterior mid-rectum; and entire lower 1/3 rectum): N/A Mesenteric margin (sigmoid and transverse): Free of tumor. Distance closest margin (if all above margins negative): 6 cm from mesenteric margin. Treatment effect (neo-adjuvant therapy): No. Additional polyp(s): No. Non-neoplastic findings: N/A Lymph nodes: number examined - 18; number positive: 2 Pathologic Staging: pT2, pN1b, pMX Ancillary studies: MSI by PCR and MMR by IHC. (JDP:gt, 04/11/17)      04/07/2017 Surgery    Laparoscopic sigmoid colectomy per Dr. Barry Durham      04/19/2017 Imaging    CT chest, abdomen and pelvis with contrast showed no definitive evidence of metastasis, numerous indeterminate subcentimeter hypodense lesions scattered throughout the liver, likely benign.      04/27/2017 Tumor Marker    CEA Pre-op 04/04/17: 1.4 Post-op 04/27/17: 1.27      05/09/2017 Imaging    MRI Abdomen IMPRESSION: 1. No evidence of metastatic colon cancer in the abdomen. 2. Multiple simple hepatic cysts. 3. Known cholelithiasis,  not well visualized. No biliary  dilatation.       05/12/2017 -  Chemotherapy    CAPOX q3weeks with Xeloda 1067m BID, 2 weeks on 1 week off. Starting 05/12/17. Xeloda held on 05/19/17 due to poor toleration. Proceed with single agent Xeloda 10023mBID starting with Cycle 2 on 06/04/17. Started cycle 3 Xeloda 1500 mg BID x14 days/7 days off on 06/25/17. Cycle 7 held on 09/16/17 due to Grade 3 hand foot syndrome. Reduced dose with Cycle 7 Xeloda 1500 mg in the morning and 1000 mg in the evening on 09/30/17.       05/19/2017 Tumor Marker    CEA: 1.35      06/23/2017 Tumor Marker    CEA: 1.31        HISTORY OF PRESENTING ILLNESS: 04/27/17  [name]89.o. female is here because of newly diagnosed colon cancer. She presents to the clinic today accompanied by her daughter. She was referred by Dr. ByBarry DienesIn 12/2015 the patient was [facility] with GI bleed and acute blood loss anemia, she was also found to be iron deficient. Anemia improved with blood transfusion and she began taking oral iron supplementation. A colonoscopy was scheduled but ultimately cancelled because she could not tolerate prep. It was not rescheduled. She reports chronic constipation with intermittent blood on tissue after hard BM.  She previously lost weight but has gained it back. Colonoscopy was eventually rescheduled and performed on 03/22/2017 by Dr. BiMarian Sorrowo follow up on GI bleeding and because she had never had one. Colonoscopy revealed a malignant appearing near obstructing mass at about 25 cm, and a pedunculated polyp at 15 cm; both were biopsied. Colon mass pathology revealed scant fragments of intramucosal adenocarcinoma arising in an adenomatous polyp with high grade dysplasia; polyp biopsy revealed tubulovillous adeno suspicious for high grade dysplasia. She was referred to Dr. ByBarry Dienesor segmental resection on 04/07/17, pathology revealed invasive adenocarcinoma 2.5cm that invades the muscularis propria, margins were negative, with  metastatic disease in 2 of 18 lymph nodes.   Today she is recovering well from surgery. She has good appetite. Continues to have constipation, has not used medication; last BM 1 day ago. She denies pain, nausea, vomiting, blood in stool, or fatigue.  Patient is not currently working. She walks around the block and does yard work for exercise. She performs ADL's independently, lives with her brother and daughter; she is without a car, transportation is unreliable. Family has to borrow friend's car for rides.    CURRENT THERAPY: CAPOX q3weeks with Xeloda 100017mID, 2 weeks on 1 week off. Starting 05/12/17. Xeloda held on 05/19/17 due to poor toleration. Proceed with single agent Xeloda 1000m45mD starting with Cycle 2 on 06/04/17. Started cycle 3 Xeloda 1500 mg BID x14 days/7 days off on 06/25/17. Cycle 7 held on 09/16/17 due to Grade 3 hand foot syndrome. Reduced dose with Cycle 7 Xeloda 1500 mg in the morning and 1000 mg in the evening on 09/30/17.    INTERVAL THERAPY:  Elizabeth PURYEARhere for a follow up for her sigmoid colon cancer. She presents to the clinic today by herslef. She notes she is doing much better since her last visit 2 weeks ago. Her hands are much better with a break and the urea cream. She still has dryness.   On review of systems, pt denies any other complaints at this time. Pertinent positives are listed and detailed within the above HPI.  REVIEW OF SYSTEMS:   Constitutional: Denies fatigue, fevers, chills or abnormal night sweats  Eyes: Denies blurriness of vision, double vision or watery eyes Ears, nose, mouth, throat, and face: Denies mucositis or sore throat Respiratory: Denies cough, dyspnea or wheezes Cardiovascular: Denies palpitation, chest discomfort  Gastrointestinal:  Denies nausea, vomiting, diarrhea, heartburn, blood in stool, abdominal pain, or change in bowel habits  Skin: (+) redness to bilateral palms of her hands with significant dry skin and cracking on  palms. Same condition for her feet.   Lymphatics: Denies new lymphadenopathy or easy bruising Neurological:Denies numbness, tingling or new weaknesses Behavioral/Psych: Mood is stable, no new changes  All other systems were reviewed with the patient and are negative.   MEDICAL HISTORY:  Past Medical History:  Diagnosis Date  . Essential hypertension   . GERD (gastroesophageal reflux disease)   . GI bleed   . High cholesterol   . Iron deficiency anemia     SURGICAL HISTORY: Past Surgical History:  Procedure Laterality Date  . COLONOSCOPY    . LAPAROSCOPIC SIGMOID COLECTOMY N/A 04/07/2017   Procedure: LAPAROSCOPIC SIGMOID COLECTOMY, ERAS PATHWAY, splenic flexure takedown;  Surgeon: Elizabeth Klein, MD;  Location: [facility];  Service: General;  Laterality: N/A;  . TONSILLECTOMY    . TOTAL HIP ARTHROPLASTY Right 2012    SOCIAL HISTORY: Social History   Socioeconomic History  . Marital status: Widowed    Spouse name: Not on file  . Number of children: Not on file  . Years of education: Not on file  . Highest education level: Not on file  Occupational History  . Not on file  Social Needs  . Financial resource strain: Not on file  . Food insecurity:    Worry: Not on file    Inability: Not on file  . Transportation needs:    Medical: Not on file    Non-medical: Not on file  Tobacco Use  . Smoking status: Former Smoker    Packs/day: 0.50    Years: 7.00    Pack years: 3.50    Types: Cigarettes    Last attempt to quit: 1983    Years since quitting: 36.2  . Smokeless tobacco: Never Used  Substance and Sexual Activity  . Alcohol use: No    Alcohol/week: 0.0 oz  . Drug use: No  . Sexual activity: Not on file  Lifestyle  . Physical activity:    Days per week: Not on file    Minutes per session: Not on file  . Stress: Not on file  Relationships  . Social connections:    Talks on phone: Not on file    Gets together: Not on file    Attends religious service: Not on file     Active member of club or organization: Not on file    Attends meetings of clubs or organizations: Not on file    Relationship status: Not on file  . Intimate partner violence:    Fear of current or ex partner: Not on file    Emotionally abused: Not on file    Physically abused: Not on file    Forced sexual activity: Not on file  Other Topics Concern  . Not on file  Social History Narrative  . Not on file    FAMILY HISTORY: Family History  Problem Relation Age of Onset  . Leukemia Mother   . Cancer Father        unknown type  Negative family history for GI cancer; her  father had unknown type of cancer   ALLERGIES:  has No Known Allergies.  MEDICATIONS:  Current Outpatient Medications  Medication Sig Dispense Refill  . alendronate (FOSAMAX) 70 MG tablet Take 70 mg by mouth every 7 (seven) days.  11  . amLODipine (NORVASC) 10 MG tablet Take 10 mg by mouth daily. for blood pressure  1  . ammonium lactate (AMLACTIN) 12 % cream Apply topically as needed for dry skin. 385 g 0  . atorvastatin (LIPITOR) 10 MG tablet Take 10 mg by mouth daily.  3  . ferrous sulfate 325 (65 FE) MG tablet Take 325 mg by mouth daily.  1  . pantoprazole (PROTONIX) 40 MG tablet Take 1 tablet (40 mg total) by mouth daily. 30 tablet 0  . potassium chloride SA (KLOR-CON M20) 20 MEQ tablet Take 1 tablet (20 mEq total) by mouth daily. 90 tablet 1  . urea (CARMOL) 10 % cream Apply topically 2 (two) times daily. Apply to palms twice daily 71 g 1  . capecitabine (XELODA) 500 MG tablet Take 3 tabs (1534m) in AM & 2 tab (10025m in PM, within 30 minutes of meals, take for 14 days on, 7 days off, repeat every 21 days, pt has 1 week supply at home from previous cycle 35 tablet 0  . ondansetron (ZOFRAN) 8 MG tablet Take 1 tablet (8 mg total) by mouth 2 (two) times daily as needed for refractory nausea / vomiting. Start on day 3 after chemotherapy. (Patient not taking: Reported on 09/30/2017) 30 tablet 1  . prochlorperazine  (COMPAZINE) 10 MG tablet Take 1 tablet (10 mg total) by mouth every 6 (six) hours as needed (Nausea or vomiting). (Patient not taking: Reported on 09/30/2017) 30 tablet 1   No current facility-administered medications for this visit.     PHYSICAL EXAMINATION: ECOG PERFORMANCE STATUS: 1 - Symptomatic but completely ambulatory  Vitals:   09/30/17 1153  BP: 132/65  Pulse: 62  Resp: 18  Temp: (!) 97.4 F (36.3 C)  SpO2: 99%   Filed Weights   09/30/17 1153  Weight: 100 lb 9.6 oz (45.6 kg)     GENERAL:alert, no distress and comfortable SKIN: skin color, texture, turgor are normal, no rashes or significant lesions, except skin dryness on her hands, no skin peeling or ulcers EYES: normal, conjunctiva are pink and non-injected, sclera clear OROPHARYNX:no exudate, no erythema and lips, buccal mucosa, and tongue normal  NECK: supple, thyroid normal size, non-tender, without nodularity LYMPH:  no palpable cervical, axillary, or inguinal lymphadenopathy  LUNGS: clear to auscultation bilaterally with normal breathing effort HEART: regular rate & rhythm, no murmurs; trace bilateral lower extremity edema ABDOMEN:abdomen soft, non-tender and normal bowel sounds. No palpable hepatomegaly. 4 abdominal incisions and midline incision covered with steri strips, healing well, no erythema or drainage Musculoskeletal:no cyanosis of digits and no clubbing  PSYCH: alert & oriented x 3 with fluent speech NEURO: no focal motor/sensory deficits  LABORATORY DATA:  I have reviewed the data as listed CBC Latest Ref Rng & Units 09/30/2017 09/15/2017 08/25/2017  WBC 3.9 - 10.3 K/uL 4.2 5.0 5.1  Hemoglobin 11.6 - 15.9 g/dL 10.5(L) 9.9(L) 10.5(L)  Hematocrit 34.8 - 46.6 % 31.5(L) 28.5(L) 29.9(L)  Platelets 145 - 400 K/uL 154 173 178    CMP Latest Ref Rng & Units 09/30/2017 09/15/2017 08/25/2017  Glucose 70 - 140 mg/dL 95 102 112  BUN 7 - 26 mg/dL _0 Creatinine 0.60 - 1.10 mg/dL 0.94 0.93 0.93  Sodium 136  -  145 mmol/L 141 141 141  Potassium 3.5 - 5.1 mmol/L 4.6 3.6 2.7(LL)  Chloride 98 - 109 mmol/L 106 107 105  CO2 22 - 29 mmol/L _0 Calcium 8.4 - 10.4 mg/dL 9.8 9.6 9.0  Total Protein 6.4 - 8.3 g/dL 6.8 6.6 6.3(L)  Total Bilirubin 0.2 - 1.2 mg/dL 1.0 1.3(H) 1.7(H)  Alkaline Phos 40 - 150 U/L 77 81 83  AST 5 - 34 U/L _1 ALT 0 - 55 U/L _2 RADIOGRAPHIC STUDIES: I have personally reviewed the radiological images as listed and agreed with the findings in the report. No results found.   MR Abdomen w/o contrast 05/09/17 IMPRESSION: 1. No evidence of metastatic colon cancer in the abdomen. 2. Multiple simple hepatic cysts. 3. Known cholelithiasis, not well visualized. No biliary dilatation.  CT CAP 04/19/17 IMPRESSION: 1. No definite evidence of metastatic disease in the chest, abdomen or pelvis. 2. Numerous indeterminate subcentimeter hypodense lesions scattered throughout the liver, too small to characterize, more likely benign. Recommend attention on follow-up MRI (preferred) abdomen without and with IV contrast or CT abdomen with IV contrast in 3-6 months. This recommendation follows ACR consensus guidelines: Managing Incidental Findings on Abdominal CT: White Paper of the ACR Incidental Findings Committee. J Am Coll Radiol 2010;7:754-773. 3. Nonspecific small volume free fluid in the pelvis, probably postsurgical. 4. Chronic findings include: Aortic Atherosclerosis (ICD10-I70.0). Cholelithiasis.  ASSESSMENT & PLAN:  Elizabeth Durham is a 77 y.o. female with a history of GI bleed, anemia, HTN, and sigmoid colon cancer    1. Cancer of Sigmoid Colon, invasive adenocarcinoma, grade 3, pT2 pN1b, cM0, stage IIIA -I previously discussed her colonoscopy, imaging, and surgical pathology findings in detail with the patient and family -Staging CT CAP shows no definite metastatic disease but does show numerous indeterminate sub-cm hypodense lesions scattered throughout the  liver, too small to characterize but likely benign -MRI of abdomen on 05/09/17 revealed multiple non-enhancing hepatic cysts but no evidence of metastatic colon cancer in the abdomen -I reviewed her moderate risk for cancer recurrence after surgery -adjuvant chemotherapy is recommended, which is the standard care for stage III colon cancer. She is 75, but overall healthy and fit, no major medical comorbidities, has good performance status. Would be a candidate for chemo.  I discussed the option of FOLFOX q2 weeks vs CAPOX q3 weeks with Xeloda daily BID x2 weeks with 1 week off; the patient is interested in pursuing chemotherapy -I suspect she would tolerate FOLFOX better overall, but due to her transportation issues and lack of a car, she prefers CAPOX on q3 week schedule.  -On 05/12/17 she has started adjuvant CAPOX. We reduced Xeloda to 1059m BID, 2 weeks on and 1 week off and Oxaliplatin for cycle 1 due to elevated Cr and weight loss -She tolerated cycle 1 poorly with anorexia and dehydration, she lost 8 pounds in 1 week.  -due to her poor tolerance to chemo, will hold Oxaliplatin, and continue Xeloda single agent. She has been tolerating for dose 1500 mg twice daily well. Plan for total of 8 cycles. -She developed grade 3 hand-foot syndrome after cycle 6. Cycle 7 was postponed for 2 weeks   -She has recovered well. Restart with Cycle 7 today at reduced dose 1500 mg in the morning and 1000 mg in the evening. She is agreeable with this.Labs adequate to proceed -I plan to repeat her CT scan for restaging after she completes adjuvant Xeloda. -  F/u in 3 weeks before last cycle chemo    2. Anemia, iron deficiency  -she has been iron deficient in the past, on oral ferrous sulfate 1 tab daily -She received IV Feraheme on 11/8 for iron deficiency anemia and counts recovered. Has not needed more IV Feraheme since.  -HG slightly 10.5 today  -Will continue oral ferrous sulfate once daily.    3.  Hypokalemia  -she takes oral K 10 mEq PRN with lasix  -I recommend she take 20 mEq of oral K daily, increased to BID on 05/12/17  -Potassium normalized and diarrhea resolved currently. -For any episodic diarrhea, it was suggested she use 1 tab of oral K on days she has diarrhea. She agrees.   4. Hand and Foot Syndrome, secondary to Xeloda -I prescribed urea cream on (08/25/17) and advised her to continue use of OTC hydrocortisone cream with moisturizer.  -She developed grade 3 hand-foot syndrome after cycle 6. Will hold cycle 7. I will consider dose reduction or altered dosing schedule in the future -Improved significantly, reduced dose of Xeloda to 1500 mg in the morning and 1030m in the evening  6. Paronychia, secondary to Xeloda  -She has paronychia of left toenails with purulent drainage. Lacie prescribed keflex on 3/14/50f and recommended vinegar and water toenail soaks -resolved now   PLAN: -Pt is agreeable to completing 2 more cycles of Xeloda at reduced dose -Start Xeloda 1500 mg in the morning and 1000 mg in the evening, refilled today -Restaging scan in 6 weeks, will order next visit  -Lab and f/u in 3 weeks    No orders of the defined types were placed in this encounter.   All questions were answered. The patient knows to call the clinic with any problems, questions or concerns.  I spent 20 minutes counseling the patient face to face. The total time spent in the appointment was 25 minutes and more than 50% was on counseling.  This document serves as a record of services personally performed by YTruitt Merle MD. It was created on her behalf by DTheresia Bough a trained medical scribe. The creation of this record is based on the scribe's personal observations and the provider's statements to them.   I have reviewed the above documentation for accuracy and completeness, and I agree with the above.   YTruitt Durham 09/30/2017 4:49 PM

## 2017-09-30 ENCOUNTER — Inpatient Hospital Stay (HOSPITAL_BASED_OUTPATIENT_CLINIC_OR_DEPARTMENT_OTHER): Payer: Medicare HMO | Admitting: Hematology

## 2017-09-30 ENCOUNTER — Encounter: Payer: Self-pay | Admitting: Hematology

## 2017-09-30 ENCOUNTER — Telehealth: Payer: Self-pay

## 2017-09-30 ENCOUNTER — Inpatient Hospital Stay: Payer: Medicare HMO

## 2017-09-30 VITALS — BP 132/65 | HR 62 | Temp 97.4°F | Resp 18 | Ht 64.0 in | Wt 100.6 lb

## 2017-09-30 DIAGNOSIS — D509 Iron deficiency anemia, unspecified: Secondary | ICD-10-CM

## 2017-09-30 DIAGNOSIS — C187 Malignant neoplasm of sigmoid colon: Secondary | ICD-10-CM | POA: Diagnosis not present

## 2017-09-30 DIAGNOSIS — I10 Essential (primary) hypertension: Secondary | ICD-10-CM | POA: Diagnosis not present

## 2017-09-30 DIAGNOSIS — Z8719 Personal history of other diseases of the digestive system: Secondary | ICD-10-CM

## 2017-09-30 DIAGNOSIS — L271 Localized skin eruption due to drugs and medicaments taken internally: Secondary | ICD-10-CM | POA: Diagnosis not present

## 2017-09-30 DIAGNOSIS — T451X5S Adverse effect of antineoplastic and immunosuppressive drugs, sequela: Secondary | ICD-10-CM | POA: Diagnosis not present

## 2017-09-30 DIAGNOSIS — E876 Hypokalemia: Secondary | ICD-10-CM

## 2017-09-30 DIAGNOSIS — K219 Gastro-esophageal reflux disease without esophagitis: Secondary | ICD-10-CM

## 2017-09-30 DIAGNOSIS — R634 Abnormal weight loss: Secondary | ICD-10-CM | POA: Diagnosis not present

## 2017-09-30 DIAGNOSIS — R630 Anorexia: Secondary | ICD-10-CM | POA: Diagnosis not present

## 2017-09-30 DIAGNOSIS — C7989 Secondary malignant neoplasm of other specified sites: Secondary | ICD-10-CM | POA: Diagnosis not present

## 2017-09-30 DIAGNOSIS — Z9221 Personal history of antineoplastic chemotherapy: Secondary | ICD-10-CM | POA: Diagnosis not present

## 2017-09-30 DIAGNOSIS — E7800 Pure hypercholesterolemia, unspecified: Secondary | ICD-10-CM

## 2017-09-30 DIAGNOSIS — Z87891 Personal history of nicotine dependence: Secondary | ICD-10-CM

## 2017-09-30 DIAGNOSIS — Z79899 Other long term (current) drug therapy: Secondary | ICD-10-CM

## 2017-09-30 LAB — CBC WITH DIFFERENTIAL/PLATELET
BASOS ABS: 0 10*3/uL (ref 0.0–0.1)
BASOS PCT: 1 %
Eosinophils Absolute: 0.4 10*3/uL (ref 0.0–0.5)
Eosinophils Relative: 10 %
HCT: 31.5 % — ABNORMAL LOW (ref 34.8–46.6)
HEMOGLOBIN: 10.5 g/dL — AB (ref 11.6–15.9)
Lymphocytes Relative: 20 %
Lymphs Abs: 0.9 10*3/uL (ref 0.9–3.3)
MCH: 36.1 pg — ABNORMAL HIGH (ref 25.1–34.0)
MCHC: 33.3 g/dL (ref 31.5–36.0)
MCV: 108.2 fL — AB (ref 79.5–101.0)
Monocytes Absolute: 0.5 10*3/uL (ref 0.1–0.9)
Monocytes Relative: 11 %
NEUTROS ABS: 2.5 10*3/uL (ref 1.5–6.5)
NEUTROS PCT: 58 %
PLATELETS: 154 10*3/uL (ref 145–400)
RBC: 2.91 MIL/uL — ABNORMAL LOW (ref 3.70–5.45)
RDW: 14.6 % — ABNORMAL HIGH (ref 11.2–14.5)
WBC: 4.2 10*3/uL (ref 3.9–10.3)

## 2017-09-30 LAB — COMPREHENSIVE METABOLIC PANEL
ALK PHOS: 77 U/L (ref 40–150)
ALT: 21 U/L (ref 0–55)
ANION GAP: 9 (ref 3–11)
AST: 27 U/L (ref 5–34)
Albumin: 4.3 g/dL (ref 3.5–5.0)
BILIRUBIN TOTAL: 1 mg/dL (ref 0.2–1.2)
BUN: 17 mg/dL (ref 7–26)
CALCIUM: 9.8 mg/dL (ref 8.4–10.4)
CO2: 26 mmol/L (ref 22–29)
Chloride: 106 mmol/L (ref 98–109)
Creatinine, Ser: 0.94 mg/dL (ref 0.60–1.10)
GFR calc Af Amer: >60 mL/min (ref >60)
GFR calc non Af Amer: 58 mL/min — ABNORMAL LOW (ref >60)
Glucose, Bld: 95 mg/dL (ref 70–140)
POTASSIUM: 4.6 mmol/L (ref 3.5–5.1)
Sodium: 141 mmol/L (ref 136–145)
TOTAL PROTEIN: 6.8 g/dL (ref 6.4–8.3)

## 2017-09-30 MED ORDER — CAPECITABINE 500 MG PO TABS: ORAL_TABLET | ORAL | 0 refills | Status: DC

## 2017-09-30 NOTE — Telephone Encounter (Signed)
Printed avs and calender of upcoming appointment. Per 3/29 los  

## 2017-10-04 ENCOUNTER — Other Ambulatory Visit: Payer: Self-pay | Admitting: Pharmacist

## 2017-10-04 DIAGNOSIS — C187 Malignant neoplasm of sigmoid colon: Secondary | ICD-10-CM

## 2017-10-04 MED ORDER — CAPECITABINE 500 MG PO TABS: ORAL_TABLET | ORAL | 0 refills | Status: DC

## 2017-10-04 MED FILL — CAPECITABINE 500 MG TABLET: 500 | 14 days supply | Qty: 35 | Fill #0

## 2017-10-04 NOTE — Telephone Encounter (Signed)
Oral Oncology Pharmacist Encounter  Refill prescription for Xeloda (capecitabine) E scribed to [facility] outpatient pharmacy. Noted dose reduction for cycle 7 due to grade 3 hand-foot syndrome experienced at higher dose with cycle 6. Cycle 7 start date: 09/30/2017  Oral Oncology Clinic will continue to follow.  Johny Drilling, PharmD, BCPS, BCOP 10/04/2017 9:02 AM Oral Oncology Clinic [phone]

## 2017-10-13 ENCOUNTER — Telehealth: Payer: Self-pay | Admitting: *Deleted

## 2017-10-13 NOTE — Telephone Encounter (Signed)
Pt called and left message wanting to know if she should continue taking Kdur since pt has only 5 tabs left.  Noted last lab on 09/30/17 showed potassium level 4.6 .   Instructed pt to finish the rest of kdur as prescribed.  Pt will have lab rechecked on 10/21/17.   Informed pt that Dr. Burr Medico will decide if pt needs to resume kdur based on lab results same day. Pt voiced understanding.

## 2017-10-13 NOTE — Telephone Encounter (Signed)
Sounds good, thanks   Cicero Noy MD  

## 2017-10-18 ENCOUNTER — Other Ambulatory Visit: Payer: Self-pay | Admitting: Hematology

## 2017-10-18 DIAGNOSIS — C187 Malignant neoplasm of sigmoid colon: Secondary | ICD-10-CM

## 2017-10-19 ENCOUNTER — Other Ambulatory Visit: Payer: Self-pay | Admitting: Hematology

## 2017-10-19 DIAGNOSIS — C187 Malignant neoplasm of sigmoid colon: Secondary | ICD-10-CM

## 2017-10-19 MED ORDER — CAPECITABINE 500 MG PO TABS: ORAL_TABLET | ORAL | 1 refills | Status: DC

## 2017-10-19 NOTE — Progress Notes (Signed)
[place]  [phone] [phone]  Clinic Follow up Note   Patient Care Team: Cyndi Bender, PA-C as PCP - General (Physician Assistant) [name], MD as Consulting Physician (General Surgery) Truitt Merle, MD as Consulting Physician (Hematology) Alla Feeling, NP as Nurse Practitioner (Nurse Practitioner)   Date of Service:  10/21/2017   CHIEF COMPLAINTS:  Follow up cancer of Sigmoid Colon   Oncology History   Cancer Staging Cancer of sigmoid colon [facility]) Staging form: Colon and Rectum, AJCC 8th Edition - Pathologic stage from 04/07/2017: Stage IIIA (pT2, pN1b, cM0) - Signed by Truitt Merle, MD on 04/26/2017       Cancer of sigmoid colon ([place])   03/22/2017 Procedure    Colonoscopy per Dr. Marian Sorrow A rectal examination was performed and was normal. The endoscope was then inserted into the rectum and advanced under direct visualization to about 25 cm where a large, near obstructing mass was noted. This had a malignant appearance. It was biopsied. That area could not be traversed with the adult colonoscope. Small amount of bleeding was noted from the biopsy site, but this stopped right away. 7 mm pedunculated polyp was noted at 15 cm. This was removed using hot snare and retrieved for pathology. Ink was injected into the submucosa at 20 cm (5 cm distal to the mass). The endoscope was then withdrawn and the procedure terminated.   Findings / Comments: - Mass at 25 cm. Biopsied. - Polyp removed as above.       04/07/2017 Initial Diagnosis    Cancer of sigmoid colon ([place])      04/07/2017 Pathology Results    Diagnosis Colon, segmental resection for tumor, Sigmoid - INVASIVE COLORECTAL ADENOCARCINOMA, 2.5 CM. - CARCINOMA INVADES MUSCULARIS PROPRIA. - MARGINS NOT INVOLVED. - METASTATIC CARCINOMA IN TWO OF EIGHTEEN LYMPH NODES (2/18) - PROXIMAL AND DISTAL ANASTOMOTIC RINGS FREE OF TUMOR.  Specimen: Sigmoid colon with proximal and distal  anastomotic rings. Procedure: Resection. Tumor site: Sigmoid colon. Specimen integrity: Intact. Macroscopic intactness of mesorectum: N/A Macroscopic tumor perforation: No. Invasive tumor: Maximum size: 2.5 cm. Histologic type(s): Colorectal adenocarcinoma. Histologic grade and differentiation: G3 G1: well differentiated/low grade G2: moderately differentiated/low grade G3: poorly differentiated/high grade G4: undifferentiated/high grade Type of polyp in which invasive carcinoma arose: No residual polyp. Microscopic extension of invasive tumor: Into muscularis propria. Lymph-Vascular invasion: Present. Peri-neural invasion: Present. Tumor deposit(s) (discontinuous extramural extension): No.  Resection margins: Proximal margin: Free of tumor. Distal margin: Free of tumor. Circumferential (radial) (posterior ascending, posterior descending; lateral and posterior mid-rectum; and entire lower 1/3 rectum): N/A Mesenteric margin (sigmoid and transverse): Free of tumor. Distance closest margin (if all above margins negative): 6 cm from mesenteric margin. Treatment effect (neo-adjuvant therapy): No. Additional polyp(s): No. Non-neoplastic findings: N/A Lymph nodes: number examined - 18; number positive: 2 Pathologic Staging: pT2, pN1b, pMX Ancillary studies: MSI by PCR and MMR by IHC. (JDP:gt, 04/11/17)      04/07/2017 Surgery    Laparoscopic sigmoid colectomy per Dr. Barry Dienes      04/19/2017 Imaging    CT chest, abdomen and pelvis with contrast showed no definitive evidence of metastasis, numerous indeterminate subcentimeter hypodense lesions scattered throughout the liver, likely benign.      04/27/2017 Tumor Marker    CEA Pre-op 04/04/17: 1.4 Post-op 04/27/17: 1.27      05/09/2017 Imaging    MRI Abdomen IMPRESSION: 1. No evidence of metastatic colon cancer in the abdomen. 2. Multiple simple hepatic cysts. 3. Known cholelithiasis,  not well visualized. No biliary  dilatation.       05/12/2017 -  Chemotherapy    CAPOX q3weeks with Xeloda 1064m BID, 2 weeks on 1 week off. Starting 05/12/17. Xeloda held on 05/19/17 due to poor toleration. Proceed with single agent Xeloda 10025mBID starting with Cycle 2 on 06/04/17. Started cycle 3 Xeloda 1500 mg BID x14 days/7 days off on 06/25/17. Cycle 7 held on 09/16/17 due to Grade 3 hand foot syndrome. Reduced dose with Cycle 7 Xeloda 1500 mg in the morning and 1000 mg in the evening on 09/30/17.       05/19/2017 Tumor Marker    CEA: 1.35      06/23/2017 Tumor Marker    CEA: 1.31        HISTORY OF PRESENTING ILLNESS: 04/27/17  Elizabeth [age]ile89.o. female is here because of newly diagnosed colon cancer. She presents to the clinic today accompanied by her daughter. She was referred by Dr. ByBarry DienesIn 12/2015 [facility] and acute blood loss anemia, she was also found to be iron deficient. Anemia improved with blood transfusion and she began taking oral iron supplementation. A colonoscopy was scheduled but ultimately cancelled because she could not tolerate prep. It was not rescheduled. She reports chronic constipation with intermittent blood on tissue after hard BM.  She previously lost weight but has gained it back. Colonoscopy was eventually rescheduled and performed on 03/22/2017 by Dr. BiMarian Sorrowo follow up on GI bleeding and because she had never had one. Colonoscopy revealed a malignant appearing near obstructing mass at about 25 cm, and a pedunculated polyp at 15 cm; both were biopsied. Colon mass pathology revealed scant fragments of intramucosal adenocarcinoma arising in an adenomatous polyp with high grade dysplasia; polyp biopsy revealed tubulovillous adeno suspicious for high grade dysplasia. She was referred to Dr. ByBarry Dienesor segmental resection on 04/07/17, pathology revealed invasive adenocarcinoma 2.5cm that invades the muscularis propria, margins were negative, with  metastatic disease in 2 of 18 lymph nodes.   Today she is recovering well from surgery. She has good appetite. Continues to have constipation, has not used medication; last BM 1 day ago. She denies pain, nausea, vomiting, blood in stool, or fatigue.  Patient is not currently working. She walks around the block and does yard work for exercise. She performs ADL's independently, lives with her brother and daughter; she is without a car, transportation is unreliable. Family has to borrow friend's car for rides.    CURRENT THERAPY: CAPOX q3weeks with Xeloda 100074mID, 2 weeks on 1 week off. Starting 05/12/17. Xeloda held on 05/19/17 due to poor toleration. Proceed with single agent Xeloda 1000m48mD starting with Cycle 2 on 06/04/17. Started cycle 3 Xeloda 1500 mg BID x14 days/7 days off on 06/25/17. Cycle 7 held on 09/16/17 due to Grade 3 hand foot syndrome. Reduced dose with Cycle 7 Xeloda 1500 mg in the morning and 1000 mg in the evening on 09/30/17.    INTERVAL THERAPY:  Elizabeth BASKINShere for a follow up for her sigmoid colon cancer. She presents to the clinic today with her daughter. She reports her hands are stable. She has dryness and she has been using the Urea cream. She did well with her previous cycle Xeloda and she will start her last Cycle on 10/22/17.    On review of systems, pt denies any other complaints at this time. Pertinent positives are listed and detailed  within the above HPI.   REVIEW OF SYSTEMS:   Constitutional: Denies fatigue, fevers, chills or abnormal night sweats  Eyes: Denies blurriness of vision, double vision or watery eyes Ears, nose, mouth, throat, and face: Denies mucositis or sore throat Respiratory: Denies cough, dyspnea or wheezes Cardiovascular: Denies palpitation, chest discomfort  Gastrointestinal:  Denies nausea, vomiting, diarrhea, heartburn, blood in stool, abdominal pain, or change in bowel habits  Skin: (+) redness to bilateral palms of her hands with  improving dry skin and cracking on palms. Same condition for her feet.   Lymphatics: Denies new lymphadenopathy or easy bruising Neurological:Denies numbness, tingling or new weaknesses Behavioral/Psych: Mood is stable, no new changes  All other systems were reviewed with the patient and are negative.   MEDICAL HISTORY:  Past Medical History:  Diagnosis Date  . Essential hypertension   . GERD (gastroesophageal reflux disease)   . GI bleed   . High cholesterol   . Iron deficiency anemia     SURGICAL HISTORY: Past Surgical History:  Procedure Laterality Date  . COLONOSCOPY    . LAPAROSCOPIC SIGMOID COLECTOMY N/A 04/07/2017   Procedure: LAPAROSCOPIC SIGMOID COLECTOMY, ERAS PATHWAY, splenic flexure takedown;  Surgeon: [name], MD;  Location: [facility];  Service: General;  Laterality: N/A;  . TONSILLECTOMY    . TOTAL HIP ARTHROPLASTY Right 2012    SOCIAL HISTORY: Social History   Socioeconomic History  . Marital status: Widowed    Spouse name: Not on file  . Number of children: Not on file  . Years of education: Not on file  . Highest education level: Not on file  Occupational History  . Not on file  Social Needs  . Financial resource strain: Not on file  . Food insecurity:    Worry: Not on file    Inability: Not on file  . Transportation needs:    Medical: Not on file    Non-medical: Not on file  Tobacco Use  . Smoking status: Former Smoker    Packs/day: 0.50    Years: 7.00    Pack years: 3.50    Types: Cigarettes    Last attempt to quit: 1983    Years since quitting: 36.3  . Smokeless tobacco: Never Used  Substance and Sexual Activity  . Alcohol use: No    Alcohol/week: 0.0 oz  . Drug use: No  . Sexual activity: Not on file  Lifestyle  . Physical activity:    Days per week: Not on file    Minutes per session: Not on file  . Stress: Not on file  Relationships  . Social connections:    Talks on phone: Not on file    Gets together: Not on file     Attends religious service: Not on file    Active member of club or organization: Not on file    Attends meetings of clubs or organizations: Not on file    Relationship status: Not on file  . Intimate partner violence:    Fear of current or ex partner: Not on file    Emotionally abused: Not on file    Physically abused: Not on file    Forced sexual activity: Not on file  Other Topics Concern  . Not on file  Social History Narrative  . Not on file    FAMILY HISTORY: Family History  Problem Relation Age of Onset  . Leukemia Mother   . Cancer Father        unknown type  Negative  family history for GI cancer; her father had unknown type of cancer   ALLERGIES:  has No Known Allergies.  MEDICATIONS:  Current Outpatient Medications  Medication Sig Dispense Refill  . alendronate (FOSAMAX) 70 MG tablet Take 70 mg by mouth every 7 (seven) days.  11  . amLODipine (NORVASC) 10 MG tablet Take 10 mg by mouth daily. for blood pressure  1  . ammonium lactate (AMLACTIN) 12 % cream Apply topically as needed for dry skin. 385 g 0  . atorvastatin (LIPITOR) 10 MG tablet Take 10 mg by mouth daily.  3  . capecitabine (XELODA) 500 MG tablet Take 3 tabs (1515m) in AM & 2 tab (10044m in PM, within 30 minutes of meals, take for 14 days on, 7 days off, repeat every 21 days 70 tablet 1  . ferrous sulfate 325 (65 FE) MG tablet Take 325 mg by mouth daily.  1  . pantoprazole (PROTONIX) 40 MG tablet Take 1 tablet (40 mg total) by mouth daily. 30 tablet 0  . urea (CARMOL) 10 % cream Apply topically 2 (two) times daily. Apply to palms twice daily 71 g 1  . ondansetron (ZOFRAN) 8 MG tablet Take 1 tablet (8 mg total) by mouth 2 (two) times daily as needed for refractory nausea / vomiting. Start on day 3 after chemotherapy. (Patient not taking: Reported on 09/30/2017) 30 tablet 1  . potassium chloride SA (KLOR-CON M20) 20 MEQ tablet Take 1 tablet (20 mEq total) by mouth daily. (Patient not taking: Reported on  10/21/2017) 90 tablet 1  . prochlorperazine (COMPAZINE) 10 MG tablet Take 1 tablet (10 mg total) by mouth every 6 (six) hours as needed (Nausea or vomiting). (Patient not taking: Reported on 09/30/2017) 30 tablet 1   No current facility-administered medications for this visit.     PHYSICAL EXAMINATION: ECOG PERFORMANCE STATUS: 1 - Symptomatic but completely ambulatory  Vitals:   10/21/17 1003  BP: 121/68  Pulse: 60  Resp: 20  Temp: 97.9 F (36.6 C)  SpO2: 100%   Filed Weights   10/21/17 1003  Weight: 104 lb 8 oz (47.4 kg)     GENERAL:alert, no distress and comfortable SKIN: skin color, texture, turgor are normal, no rashes or significant lesions, except skin dryness on her hands, no skin peeling or ulcers EYES: normal, conjunctiva are pink and non-injected, sclera clear OROPHARYNX:no exudate, no erythema and lips, buccal mucosa, and tongue normal  NECK: supple, thyroid normal size, non-tender, without nodularity LYMPH:  no palpable cervical, axillary, or inguinal lymphadenopathy  LUNGS: clear to auscultation bilaterally with normal breathing effort HEART: regular rate & rhythm, no murmurs; trace bilateral lower extremity edema ABDOMEN:abdomen soft, non-tender and normal bowel sounds. No palpable hepatomegaly. 4 abdominal incisions and midline incision covered with steri strips, healing well, no erythema or drainage Musculoskeletal:no cyanosis of digits and no clubbing  PSYCH: alert & oriented x 3 with fluent speech NEURO: no focal motor/sensory deficits  LABORATORY DATA:  I have reviewed the data as listed CBC Latest Ref Rng & Units 10/21/2017 09/30/2017 09/15/2017  WBC 3.9 - 10.3 K/uL 4.1 4.2 5.0  Hemoglobin 11.6 - 15.9 g/dL 10.6(L) 10.5(L) 9.9(L)  Hematocrit 34.8 - 46.6 % 31.0(L) 31.5(L) 28.5(L)  Platelets 145 - 400 K/uL 173 154 173    CMP Latest Ref Rng & Units 10/21/2017 09/30/2017 09/15/2017  Glucose 70 - 140 mg/dL 89 95 102  BUN 7 - 26 mg/dL '21 17 17  ' Creatinine 0.60 -  1.10 mg/dL 0.92 0.94 0.93  Sodium 136 - 145 mmol/L 141 141 141  Potassium 3.5 - 5.1 mmol/L 3.4(L) 4.6 3.6  Chloride 98 - 109 mmol/L 107 106 107  CO2 22 - 29 mmol/L '26 26 25  ' Calcium 8.4 - 10.4 mg/dL 9.9 9.8 9.6  Total Protein 6.4 - 8.3 g/dL 7.1 6.8 6.6  Total Bilirubin 0.2 - 1.2 mg/dL 0.8 1.0 1.3(H)  Alkaline Phos 40 - 150 U/L 68 77 81  AST 5 - 34 U/L '26 27 18  ' ALT 0 - 55 U/L '23 21 13    ' RADIOGRAPHIC STUDIES: I have personally reviewed the radiological images as listed and agreed with the findings in the report. No results found.   MR Abdomen w/o contrast 05/09/17 IMPRESSION: 1. No evidence of metastatic colon cancer in the abdomen. 2. Multiple simple hepatic cysts. 3. Known cholelithiasis, not well visualized. No biliary dilatation.  CT CAP 04/19/17 IMPRESSION: 1. No definite evidence of metastatic disease in the chest, abdomen or pelvis. 2. Numerous indeterminate subcentimeter hypodense lesions scattered throughout the liver, too small to characterize, more likely benign. Recommend attention on follow-up MRI (preferred) abdomen without and with IV contrast or CT abdomen with IV contrast in 3-6 months. This recommendation follows ACR consensus guidelines: Managing Incidental Findings on Abdominal CT: White Paper of the ACR Incidental Findings Committee. J Am Coll Radiol 2010;7:754-773. 3. Nonspecific small volume free fluid in the pelvis, probably postsurgical. 4. Chronic findings include: Aortic Atherosclerosis (ICD10-I70.0). Cholelithiasis.  ASSESSMENT & PLAN:  Elizabeth Durham is a 77 y.o. female with a history of GI bleed, anemia, HTN, and sigmoid colon cancer    1. Cancer of Sigmoid Colon, invasive adenocarcinoma, grade 3, pT2 pN1b, cM0, stage IIIA -I previously discussed her colonoscopy, imaging, and surgical pathology findings in detail with the patient and family -Staging CT CAP shows no definite metastatic disease but does show numerous indeterminate sub-cm hypodense  lesions scattered throughout the liver, too small to characterize but likely benign -MRI of abdomen on 05/09/17 revealed multiple non-enhancing hepatic cysts but no evidence of metastatic colon cancer in the abdomen -I previously reviewed her moderate risk for cancer recurrence after surgery -adjuvant chemotherapy is recommended, which is the standard care for stage III colon cancer. She is 75, but overall healthy and fit, no major medical comorbidities, has good performance status. Would be a candidate for chemo.  I previously discussed the option of FOLFOX q2 weeks vs CAPOX q3 weeks with Xeloda daily BID x2 weeks with 1 week off; the patient is interested in pursuing chemotherapy -I suspect she would tolerate FOLFOX better overall, but due to her transportation issues and lack of a car, she prefers CAPOX on q3 week schedule.  -On 05/12/17 she started adjuvant CAPOX. We reduced Xeloda to 1042m BID, 2 weeks on and 1 week off and Oxaliplatin for cycle 1 due to elevated Cr and weight loss -She tolerated cycle 1 poorly with anorexia and dehydration, she lost 8 pounds in 1 week.  -due to her poor tolerance to chemo, will hold Oxaliplatin after Cycle 1, and continue Xeloda single agent. Plan for total of 8 cycles. -She developed grade 3 hand-foot syndrome after cycle 6. Cycle 7 was postponed for 2 weeks   -She has recovered well. She restarted Cycle 7  at reduced dose 1500 mg in the morning and 1000 mg in the evening. She is agreeable with this. She tolerated very well  -Labs reviewed today, adequate to proceed with Cycle 8 last cycle Xeloda -I plan to repeat  her CT scan for restaging after she completes adjuvant Xeloda in 2 months before her next visit. -We discussed the surveillance plan including surveillance scan every 12 months and following up regularly with lab and physical exam every 3-4 months for the first year then every 6 months followed by yearly for a total of 5 years.  -F/u in 2 months    2.  Anemia, iron deficiency  -she has been iron deficient in the past, on oral ferrous sulfate 1 tab daily -She received IV Feraheme on 11/8 for iron deficiency anemia and counts recovered. Has not needed more IV Feraheme since.  -HG slightly 10.5 today  -Will continue oral ferrous sulfate once daily.    3. Hypokalemia  -she takes oral K 10 mEq PRN with lasix  -I recommend she take 20 mEq of oral K daily, increased to BID on 05/12/17  -Potassium normalized and diarrhea resolved currently. -For any episodic diarrhea, it was suggested she use 1 tab of oral K on days she has diarrhea. She agrees.  -Her potassium is 3.4 today, we will refill her potassium 20 Mariko daily for 7 more days  4. Hand and Foot Syndrome, secondary to Xeloda -I prescribed urea cream on (08/25/17) and advised her to continue use of OTC hydrocortisone cream with moisturizer.  -She developed grade 3 hand-foot syndrome after cycle 6. Will hold cycle 7. I will consider dose reduction or altered dosing schedule in the future -Improved significantly, reduced dose of Xeloda to 1500 mg in the morning and 1035m in the evening  6. Paronychia, secondary to Xeloda  -She has paronychia of left toenails with purulent drainage. Lacie prescribed keflex on 09/15/17 and recommended vinegar and water toenail soaks -resolved now   PLAN: -Start Cycle 8 last cycle Xeloda 1500 mg in the morning and 1000 mg in the evening, refilled today -Surveillance CT abdomen and pelvis with contrast scan in 2 months, ordered today -F/u in 2 months  -refilled KCL 231m daily for 7 days    Orders Placed This Encounter  Procedures  . CT Abdomen Pelvis W Contrast    Standing Status:   Future    Standing Expiration Date:   10/21/2018    Order Specific Question:   If indicated for the ordered procedure, I authorize the administration of contrast media per Radiology protocol    Answer:   Yes    Order Specific Question:   [facility]     [facility]  Order Specific Question:   Is Oral Contrast requested for this exam?    Answer:   Per Radiology protocol    Order Specific Question:   Radiology Contrast Protocol - do NOT remove file path    Answer:   [email or web address]    All questions were answered. The patient knows to call the clinic with any problems, questions or concerns.  I spent 20 minutes counseling the patient face to face. The total time spent in the appointment was 25 minutes and more than 50% was on counseling.  This document serves as a record of services personally performed by YaTruitt MerleMD. It was created on her behalf by DaTheresia Bougha trained medical scribe. The creation of this record is based on the scribe's personal observations and the provider's statements to them.   I have reviewed the above documentation for accuracy and completeness, and I agree with the above.   YaTruitt Merle4/19/2019 11:15 AM

## 2017-10-21 ENCOUNTER — Telehealth: Payer: Self-pay | Admitting: Hematology

## 2017-10-21 ENCOUNTER — Inpatient Hospital Stay: Payer: Medicare HMO | Attending: Hematology | Admitting: Hematology

## 2017-10-21 ENCOUNTER — Inpatient Hospital Stay: Payer: Medicare HMO

## 2017-10-21 ENCOUNTER — Encounter: Payer: Self-pay | Admitting: Hematology

## 2017-10-21 VITALS — BP 121/68 | HR 60 | Temp 97.9°F | Resp 20 | Ht 64.0 in | Wt 104.5 lb

## 2017-10-21 DIAGNOSIS — E7800 Pure hypercholesterolemia, unspecified: Secondary | ICD-10-CM | POA: Diagnosis not present

## 2017-10-21 DIAGNOSIS — C7989 Secondary malignant neoplasm of other specified sites: Secondary | ICD-10-CM | POA: Diagnosis not present

## 2017-10-21 DIAGNOSIS — Z87891 Personal history of nicotine dependence: Secondary | ICD-10-CM | POA: Diagnosis not present

## 2017-10-21 DIAGNOSIS — L03032 Cellulitis of left toe: Secondary | ICD-10-CM | POA: Diagnosis not present

## 2017-10-21 DIAGNOSIS — E876 Hypokalemia: Secondary | ICD-10-CM | POA: Diagnosis not present

## 2017-10-21 DIAGNOSIS — Z9221 Personal history of antineoplastic chemotherapy: Secondary | ICD-10-CM

## 2017-10-21 DIAGNOSIS — D509 Iron deficiency anemia, unspecified: Secondary | ICD-10-CM | POA: Diagnosis not present

## 2017-10-21 DIAGNOSIS — K5909 Other constipation: Secondary | ICD-10-CM | POA: Insufficient documentation

## 2017-10-21 DIAGNOSIS — C187 Malignant neoplasm of sigmoid colon: Secondary | ICD-10-CM | POA: Insufficient documentation

## 2017-10-21 DIAGNOSIS — R634 Abnormal weight loss: Secondary | ICD-10-CM

## 2017-10-21 DIAGNOSIS — Z79899 Other long term (current) drug therapy: Secondary | ICD-10-CM | POA: Insufficient documentation

## 2017-10-21 DIAGNOSIS — I10 Essential (primary) hypertension: Secondary | ICD-10-CM | POA: Insufficient documentation

## 2017-10-21 DIAGNOSIS — K219 Gastro-esophageal reflux disease without esophagitis: Secondary | ICD-10-CM

## 2017-10-21 LAB — COMPREHENSIVE METABOLIC PANEL
ALBUMIN: 4.5 g/dL (ref 3.5–5.0)
ALT: 23 U/L (ref 0–55)
ANION GAP: 8 (ref 3–11)
AST: 26 U/L (ref 5–34)
Alkaline Phosphatase: 68 U/L (ref 40–150)
BILIRUBIN TOTAL: 0.8 mg/dL (ref 0.2–1.2)
BUN: 21 mg/dL (ref 7–26)
CHLORIDE: 107 mmol/L (ref 98–109)
CO2: 26 mmol/L (ref 22–29)
Calcium: 9.9 mg/dL (ref 8.4–10.4)
Creatinine, Ser: 0.92 mg/dL (ref 0.60–1.10)
GFR calc Af Amer: >60 mL/min (ref >60)
GFR calc non Af Amer: 60 mL/min — ABNORMAL LOW (ref >60)
GLUCOSE: 89 mg/dL (ref 70–140)
POTASSIUM: 3.4 mmol/L — AB (ref 3.5–5.1)
Sodium: 141 mmol/L (ref 136–145)
TOTAL PROTEIN: 7.1 g/dL (ref 6.4–8.3)

## 2017-10-21 LAB — CBC WITH DIFFERENTIAL/PLATELET
BASOS ABS: 0 10*3/uL (ref 0.0–0.1)
Basophils Relative: 0 %
Eosinophils Absolute: 0.2 10*3/uL (ref 0.0–0.5)
Eosinophils Relative: 5 %
HEMATOCRIT: 31 % — AB (ref 34.8–46.6)
Hemoglobin: 10.6 g/dL — ABNORMAL LOW (ref 11.6–15.9)
LYMPHS ABS: 0.5 10*3/uL — AB (ref 0.9–3.3)
Lymphocytes Relative: 12 %
MCH: 36.6 pg — AB (ref 25.1–34.0)
MCHC: 34.1 g/dL (ref 31.5–36.0)
MCV: 107.3 fL — ABNORMAL HIGH (ref 79.5–101.0)
Monocytes Absolute: 0.4 10*3/uL (ref 0.1–0.9)
Monocytes Relative: 11 %
NEUTROS ABS: 2.9 10*3/uL (ref 1.5–6.5)
Neutrophils Relative %: 72 %
Platelets: 173 10*3/uL (ref 145–400)
RBC: 2.89 MIL/uL — AB (ref 3.70–5.45)
RDW: 17.4 % — ABNORMAL HIGH (ref 11.2–14.5)
WBC: 4.1 10*3/uL (ref 3.9–10.3)

## 2017-10-21 LAB — CEA (IN HOUSE-CHCC): CEA (CHCC-In House): 5.02 ng/mL — ABNORMAL HIGH (ref 0.00–5.00)

## 2017-10-21 LAB — FERRITIN: FERRITIN: 104 ng/mL (ref 9–269)

## 2017-10-21 LAB — IRON AND TIBC
Iron: 57 ug/dL (ref 41–142)
SATURATION RATIOS: 15 % — AB (ref 21–57)
TIBC: 376 ug/dL (ref 236–444)
UIBC: 320 ug/dL

## 2017-10-21 MED ORDER — POTASSIUM CHLORIDE CRYS ER 20 MEQ PO TBCR: 20.0000 meq | EXTENDED_RELEASE_TABLET | Freq: Every day | ORAL | 0 refills | Status: DC

## 2017-10-21 MED FILL — CAPECITABINE 500 MG TABLET: 500 | 21 days supply | Qty: 70 | Fill #0

## 2017-10-21 NOTE — Telephone Encounter (Signed)
Scheduled appt per 4/19 los- gave pt AVS and calender per los.

## 2017-11-11 IMAGING — MR MR ABDOMEN WO/W CM
10 of 19 series · 21 of 48 positions shown · IV contrast (multihance)
Comparison: CT 04/19/2017.

CLINICAL DATA: Sigmoid colon cancer status post laparoscopic
colectomy 04/07/2017. Indeterminate liver lesions on CT.

EXAM:
MRI ABDOMEN WITHOUT AND WITH CONTRAST
TECHNIQUE: Multiplanar multisequence MR imaging of the abdomen was performed
both before and after the administration of intravenous contrast.
CONTRAST:  9mL MULTIHANCE GADOBENATE DIMEGLUMINE 529 MG/ML IV SOLN

[Series 3: T2 fat-sat · axial · 5.0mm · 0.78mm/px · z∈[-63,+162]mm · 2 of 46 slices shown]
[im 1/46]
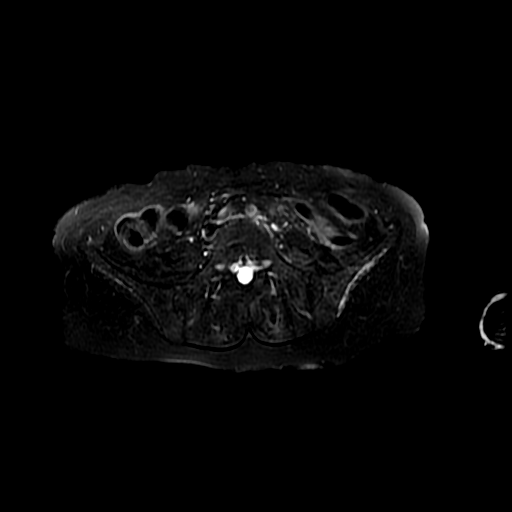
[im 46/46]
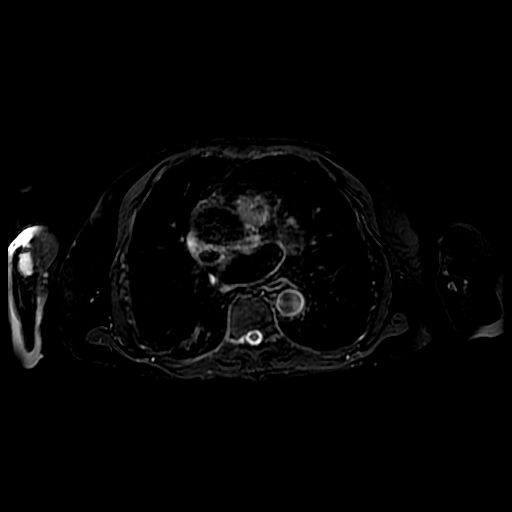

[Series 4: DWI b500 · axial · 6.0mm · 1.48mm/px · z∈[-96,+138]mm · 3 of 62 slices shown]
[im 1/62]
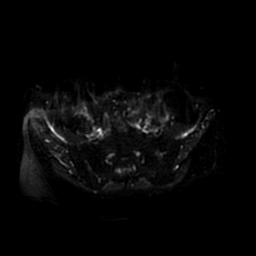
[im 31/62]
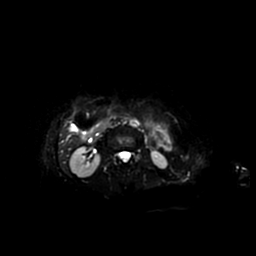
[im 62/62]
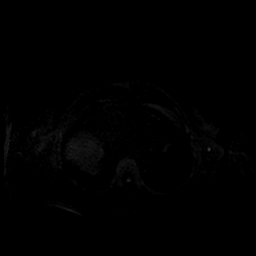

[Series 5: ax dualecho · axial · 5.0mm · 0.78mm/px · z∈[-94,+136]mm · 3 of 94 slices shown (1 of 2)]
[im 1/94]
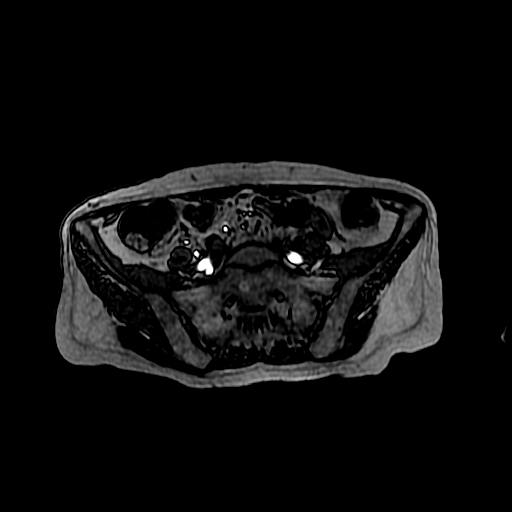
[im 47/94]
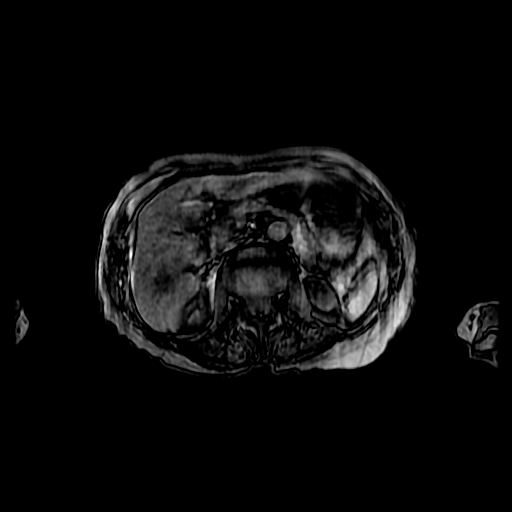
[im 94/94]
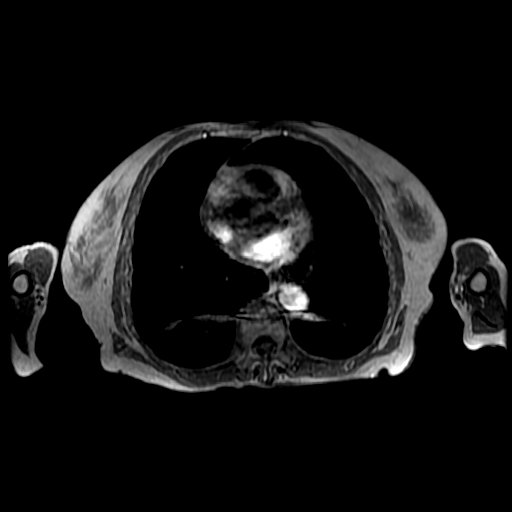

[Series 6: ax dualecho · axial · 5.0mm · 0.78mm/px · z∈[-94,+136]mm · 3 of 94 slices shown (2 of 2)]
[im 1/94]
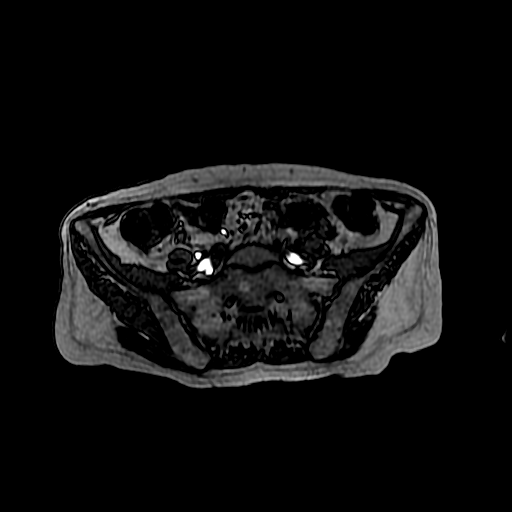
[im 47/94]
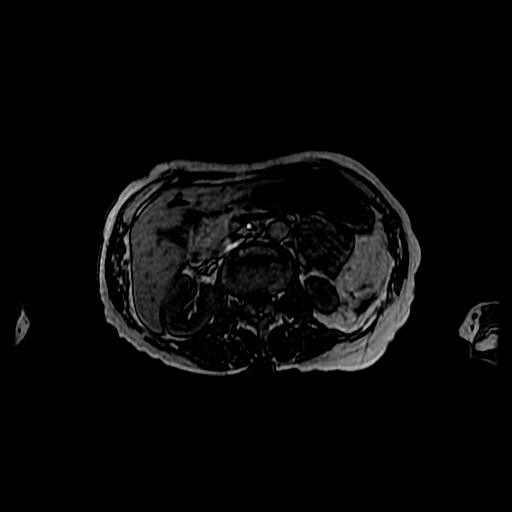
[im 94/94]
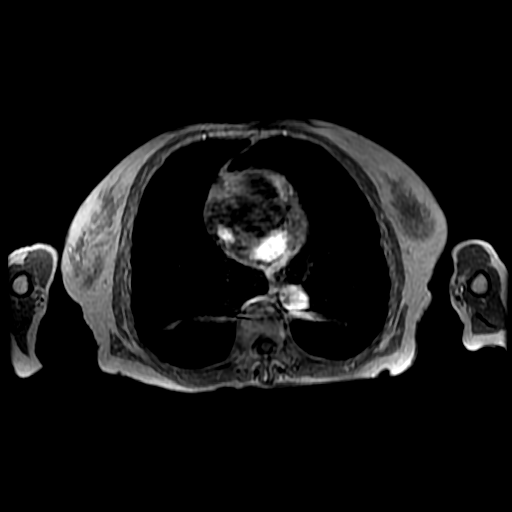

[Series 7: T2 · axial · 5.0mm · 0.78mm/px · 1 of 47 slices shown (1 of 2)]
[im 1/47]
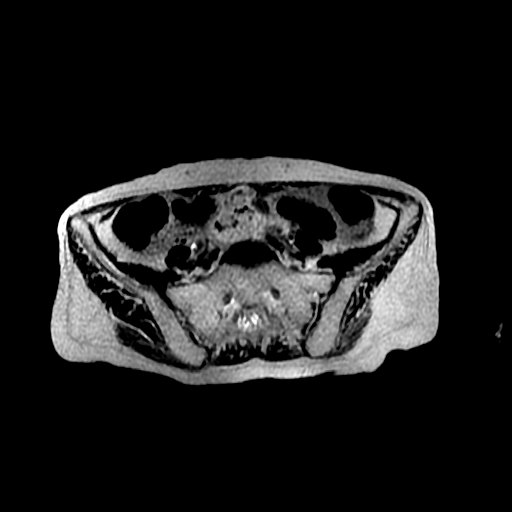

[Series 8: bSSFP · axial · 5.0mm · 0.78mm/px · 1 of 47 slices shown]
[im 1/47]
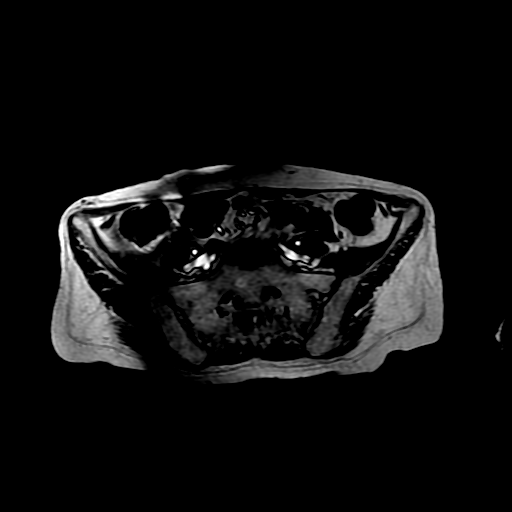

[Series 11: T2 · coronal · 5.0mm · 0.78mm/px · 1 of 35 slices shown (2 of 2)]
[im 1/35]
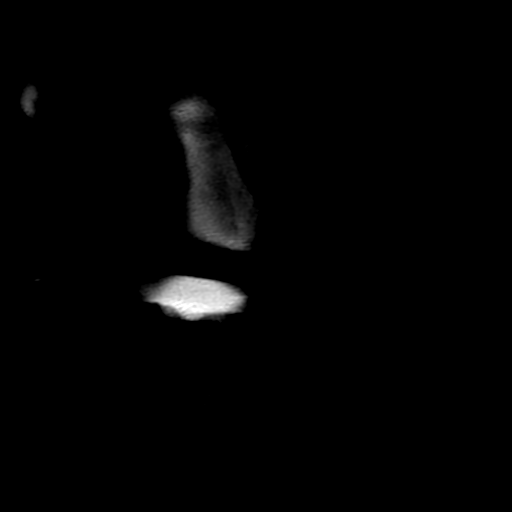

[Series 400: DWI · axial · 6.0mm · 1.48mm/px · 1 of 31 slices shown]
[im 1/31]
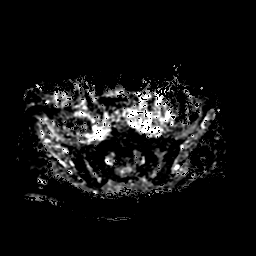

[Series 1200: T1 dynamic · axial · 5.0mm · 0.78mm/px · z∈[-76,+161]mm · 3 of 96 slices shown (1 of 2)]
[im 1/96]
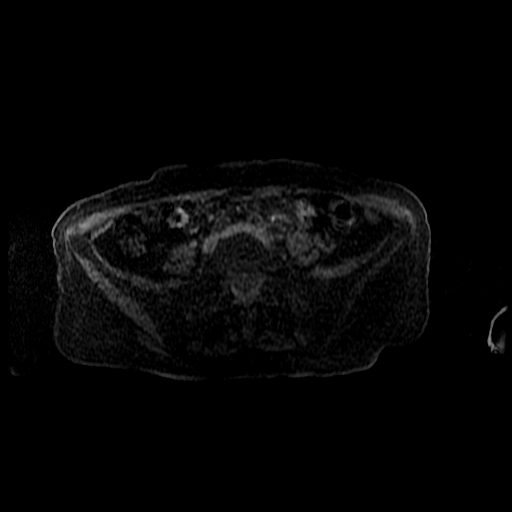
[im 48/96]
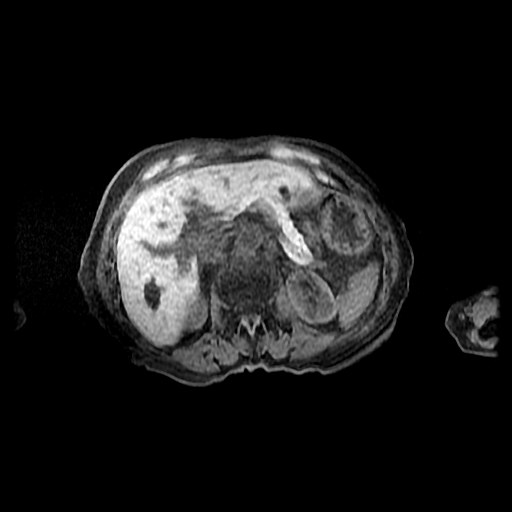
[im 96/96]
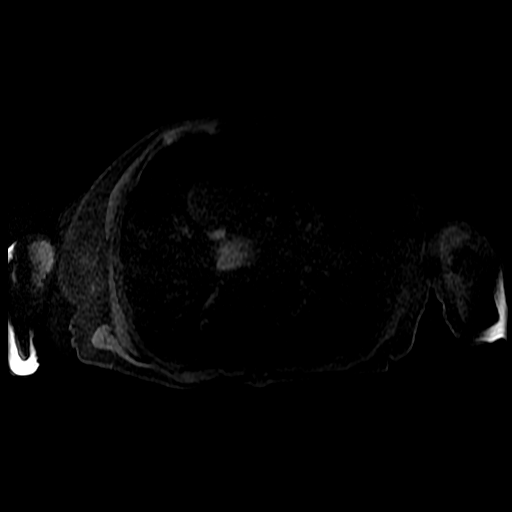

[Series 1201: T1 dynamic · axial · 5.0mm · 0.78mm/px · z∈[-76,+161]mm · 3 of 96 slices shown (2 of 2)]
[im 1/96]
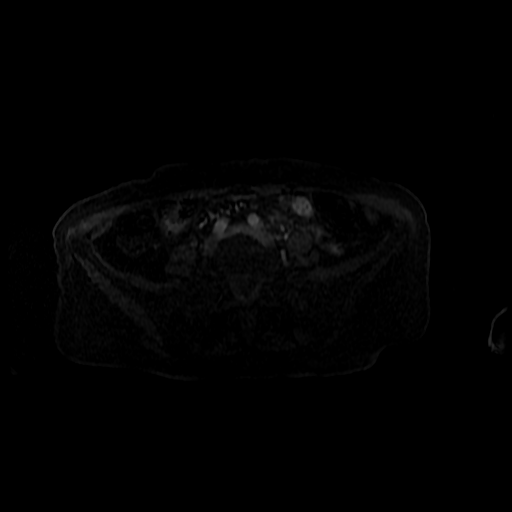
[im 48/96]
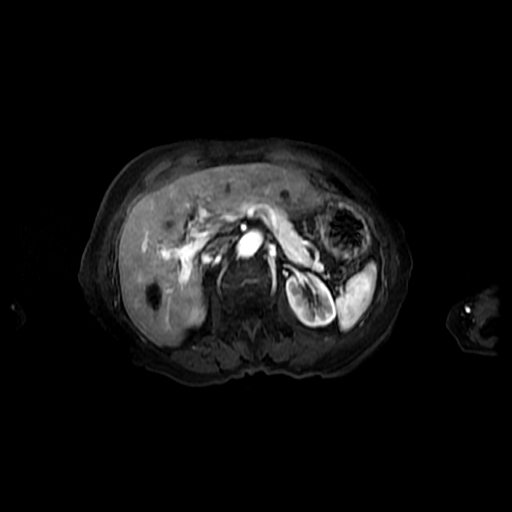
[im 96/96]
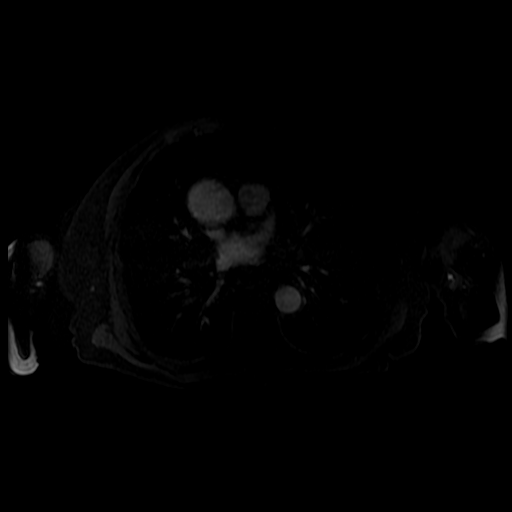

[21 of 48 positions shown; findings below may reference images not displayed]

FINDINGS: Lower chest:  The visualized lower chest appears unremarkable.

Hepatobiliary: As demonstrated on CT, there are numerous hepatic
cysts. The largest cysts measure 1.7 x 1.3 cm in the left lobe
(image 15 of series 3) and 22 x 16 mm in the right lobe (image 21).
There are numerous smaller lesions. No enhancing or otherwise
worrisome hepatic findings. Small gallstones are better seen on
previous CT. No evidence of gallbladder wall thickening or biliary
dilatation.

Pancreas: Unremarkable. No pancreatic ductal dilatation or
surrounding inflammatory changes.

Spleen: Normal in size without focal abnormality.

Adrenals/Urinary Tract: Both adrenal glands appear normal. 2.8 cm
cyst in the upper pole the left kidney is noted. There are
additional tiny cysts bilaterally. No evidence of enhancing renal
mass or hydronephrosis.

Stomach/Bowel: No evidence of bowel wall thickening, distention or
surrounding inflammatory change.

Vascular/Lymphatic: There are no enlarged abdominal lymph nodes. No
acute vascular findings.

Other: No ascites or peritoneal nodularity.

Musculoskeletal: No acute or significant osseous findings. There are
mild degenerative changes within the spine. There are multiple
perineural cysts throughout the thoracolumbar spine.
IMPRESSION: 1. No evidence of metastatic colon cancer in the abdomen.
2. Multiple simple hepatic cysts.
3. Known cholelithiasis, not well visualized. No biliary dilatation.

## 2017-12-16 NOTE — Progress Notes (Signed)
[place]  [phone]) [phone] [phone]  Clinic Follow up Note   Patient Care Team: Cyndi Bender, PA-C as PCP - General (Physician Assistant) [name], MD as Consulting Physician (General Surgery) Truitt Merle, MD as Consulting Physician (Hematology) Alla Feeling, NP as Nurse Practitioner (Nurse Practitioner)   Date of Service:  12/21/2017   CHIEF COMPLAINTS:  Follow up cancer of Sigmoid Colon   Oncology History   Cancer Staging Cancer of sigmoid colon [place]) Staging form: Colon and Rectum, AJCC 8th Edition - Pathologic stage from 04/07/2017: Stage IIIA (pT2, pN1b, cM0) - Signed by Truitt Merle, MD on 04/26/2017       Cancer of sigmoid colon ([place])   03/22/2017 Procedure    Colonoscopy per Dr. Marian Sorrow A rectal examination was performed and was normal. The endoscope was then inserted into the rectum and advanced under direct visualization to about 25 cm where a large, near obstructing mass was noted. This had a malignant appearance. It was biopsied. That area could not be traversed with the adult colonoscope. Small amount of bleeding was noted from the biopsy site, but this stopped right away. 7 mm pedunculated polyp was noted at 15 cm. This was removed using hot snare and retrieved for pathology. Ink was injected into the submucosa at 20 cm (5 cm distal to the mass). The endoscope was then withdrawn and the procedure terminated.   Findings / Comments: - Mass at 25 cm. Biopsied. - Polyp removed as above.       04/07/2017 Initial Diagnosis    Cancer of sigmoid colon ([place])      04/07/2017 Pathology Results    Diagnosis Colon, segmental resection for tumor, Sigmoid - INVASIVE COLORECTAL ADENOCARCINOMA, 2.5 CM. - CARCINOMA INVADES MUSCULARIS PROPRIA. - MARGINS NOT INVOLVED. - METASTATIC CARCINOMA IN TWO OF EIGHTEEN LYMPH NODES (2/18) - PROXIMAL AND DISTAL ANASTOMOTIC RINGS FREE OF TUMOR.  Specimen: Sigmoid colon with proximal and distal  anastomotic rings. Procedure: Resection. Tumor site: Sigmoid colon. Specimen integrity: Intact. Macroscopic intactness of mesorectum: N/A Macroscopic tumor perforation: No. Invasive tumor: Maximum size: 2.5 cm. Histologic type(s): Colorectal adenocarcinoma. Histologic grade and differentiation: G3 G1: well differentiated/low grade G2: moderately differentiated/low grade G3: poorly differentiated/high grade G4: undifferentiated/high grade Type of polyp in which invasive carcinoma arose: No residual polyp. Microscopic extension of invasive tumor: Into muscularis propria. Lymph-Vascular invasion: Present. Peri-neural invasion: Present. Tumor deposit(s) (discontinuous extramural extension): No.  Resection margins: Proximal margin: Free of tumor. Distal margin: Free of tumor. Circumferential (radial) (posterior ascending, posterior descending; lateral and posterior mid-rectum; and entire lower 1/3 rectum): N/A Mesenteric margin (sigmoid and transverse): Free of tumor. Distance closest margin (if all above margins negative): 6 cm from mesenteric margin. Treatment effect (neo-adjuvant therapy): No. Additional polyp(s): No. Non-neoplastic findings: N/A Lymph nodes: number examined - 18; number positive: 2 Pathologic Staging: pT2, pN1b, pMX Ancillary studies: MSI by PCR and MMR by IHC. (JDP:gt, 04/11/17)      04/07/2017 Surgery    Laparoscopic sigmoid colectomy per Dr. Barry Dienes      04/19/2017 Imaging    CT chest, abdomen and pelvis with contrast showed no definitive evidence of metastasis, numerous indeterminate subcentimeter hypodense lesions scattered throughout the liver, likely benign.      04/27/2017 Tumor Marker    CEA Pre-op 04/04/17: 1.4 Post-op 04/27/17: 1.27      05/09/2017 Imaging    MRI Abdomen IMPRESSION: 1. No evidence of metastatic colon cancer in the abdomen. 2. Multiple simple hepatic cysts. 3. Known cholelithiasis,  not well visualized. No biliary  dilatation.       05/12/2017 - 11/2017 Chemotherapy    CAPOX q3weeks with Xeloda 1032m BID, 2 weeks on 1 week off. Starting 05/12/17. Xeloda held on 05/19/17 due to poor toleration. Proceed with single agent Xeloda 1001mBID starting with Cycle 2 on 06/04/17. Started cycle 3 Xeloda 1500 mg BID x14 days/7 days off on 06/25/17. Cycle 7 held on 09/16/17 due to Grade 3 hand foot syndrome. Reduced dose with Cycle 7 Xeloda 1500 mg in the morning and 1000 mg in the evening on 09/30/17.       05/19/2017 Tumor Marker    CEA: 1.35      06/23/2017 Tumor Marker    CEA: 1.31        HISTORY OF PRESENTING ILLNESS: 04/27/17  Elizabeth Guile77o. female is here because of newly diagnosed colon cancer. She presents to the clinic today accompanied by her daughter. She was referred by Dr. ByBarry DienesIn 12/2015 [facility] and acute blood loss anemia, she was also found to be iron deficient. Anemia improved with blood transfusion and she began taking oral iron supplementation. A colonoscopy was scheduled but ultimately cancelled because she could not tolerate prep. It was not rescheduled. She reports chronic constipation with intermittent blood on tissue after hard BM.  She previously lost weight but has gained it back. Colonoscopy was eventually rescheduled and performed on 03/22/2017 by Dr. BiMarian Sorrowo follow up on GI bleeding and because she had never had one. Colonoscopy revealed a malignant appearing near obstructing mass at about 25 cm, and a pedunculated polyp at 15 cm; both were biopsied. Colon mass pathology revealed scant fragments of intramucosal adenocarcinoma arising in an adenomatous polyp with high grade dysplasia; polyp biopsy revealed tubulovillous adeno suspicious for high grade dysplasia. She was referred to Dr. ByBarry Dienesor segmental resection on 04/07/17, pathology revealed invasive adenocarcinoma 2.5cm that invades the muscularis propria, margins were negative,  with metastatic disease in 2 of 18 lymph nodes.   Today she is recovering well from surgery. She has good appetite. Continues to have constipation, has not used medication; last BM 1 day ago. She denies pain, nausea, vomiting, blood in stool, or fatigue.  Patient is not currently working. She walks around the block and does yard work for exercise. She performs ADL's independently, lives with her brother and daughter; she is without a car, transportation is unreliable. Family has to borrow friend's car for rides.    CURRENT THERAPY: Surveillance.  INTERVAL THERAPY:  Elizabeth RINGSTADs here for a follow up for her sigmoid colon cancer. She presents to the clinic today with her family member. She is doing well overall.  Last chemo was fine w/o new complaints. Pt. Reports better energy and appetite.  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  Chemo related Am[place] improved form last visit. Still takes iron pills. Potassium is better and she no longer takes pills.  On review of systems, she complained of dry skin and loss of toe nails. Pertinent positives are listed and detailed within the above HPI.   REVIEW OF SYSTEMS: Constitutional: Denies fatigue, fevers, chills or abnormal night sweats  Eyes: Denies blurriness of vision, double vision or watery eyes Ears, nose, mouth, throat, and face: Denies mucositis or sore throat Respiratory: Denies cough, dyspnea or wheezes Cardiovascular: Denies palpitation, chest discomfort  Gastrointestinal:  Denies nausea, vomiting, diarrhea, heartburn, blood in stool, abdominal pain,  or change in bowel habits  Skin: dry skin Lymphatics: Denies new lymphadenopathy or easy bruising Neurological:Denies numbness, tingling or new weaknesses Behavioral/Psych: Mood is stable, no new changes  All other systems were reviewed with the patient and are negative.   MEDICAL HISTORY:  Past Medical History:  Diagnosis Date  . Essential hypertension   .  GERD (gastroesophageal reflux disease)   . GI bleed   . High cholesterol   . Iron deficiency anemia     SURGICAL HISTORY: Past Surgical History:  Procedure Laterality Date  . COLONOSCOPY    . LAPAROSCOPIC SIGMOID COLECTOMY N/A 04/07/2017   Procedure: LAPAROSCOPIC SIGMOID COLECTOMY, ERAS PATHWAY, splenic flexure takedown;  Surgeon: [name], MD;  Location: [place];  Service: General;  Laterality: N/A;  . TONSILLECTOMY    . TOTAL HIP ARTHROPLASTY Right 2012    SOCIAL HISTORY: Social History   Socioeconomic History  . Marital status: Widowed    Spouse name: Not on file  . Number of children: Not on file  . Years of education: Not on file  . Highest education level: Not on file  Occupational History  . Not on file  Social Needs  . Financial resource strain: Not on file  . Food insecurity:    Worry: Not on file    Inability: Not on file  . Transportation needs:    Medical: Not on file    Non-medical: Not on file  Tobacco Use  . Smoking status: Former Smoker    Packs/day: 0.50    Years: 7.00    Pack years: 3.50    Types: Cigarettes    Last attempt to quit: 1983    Years since quitting: 36.4  . Smokeless tobacco: Never Used  Substance and Sexual Activity  . Alcohol use: No    Alcohol/week: 0.0 oz  . Drug use: No  . Sexual activity: Not on file  Lifestyle  . Physical activity:    Days per week: Not on file    Minutes per session: Not on file  . Stress: Not on file  Relationships  . Social connections:    Talks on phone: Not on file    Gets together: Not on file    Attends religious service: Not on file    Active member of club or organization: Not on file    Attends meetings of clubs or organizations: Not on file    Relationship status: Not on file  . Intimate partner violence:    Fear of current or ex partner: Not on file    Emotionally abused: Not on file    Physically abused: Not on file    Forced sexual activity: Not on file  Other Topics Concern  .  Not on file  Social History Narrative  . Not on file    FAMILY HISTORY: Family History  Problem Relation Age of Onset  . Leukemia Mother   . Cancer Father        unknown type  Negative family history for GI cancer; her father had unknown type of cancer   ALLERGIES:  has No Known Allergies.  MEDICATIONS:  Current Outpatient Medications  Medication Sig Dispense Refill  . alendronate (FOSAMAX) 70 MG tablet Take 70 mg by mouth every 7 (seven) days.  11  . amLODipine (NORVASC) 10 MG tablet Take 10 mg by mouth daily. for blood pressure  1  . ammonium lactate (AMLACTIN) 12 % cream Apply topically as needed for dry skin. 385 g 0  .  atorvastatin (LIPITOR) 10 MG tablet Take 10 mg by mouth daily.  3  . ferrous sulfate 325 (65 FE) MG tablet Take 325 mg by mouth daily.  1  . pantoprazole (PROTONIX) 40 MG tablet Take 1 tablet (40 mg total) by mouth daily. 30 tablet 0  . urea (CARMOL) 10 % cream Apply topically 2 (two) times daily. Apply to palms twice daily 71 g 1  . potassium chloride SA (KLOR-CON M20) 20 MEQ tablet Take 1 tablet (20 mEq total) by mouth daily. (Patient not taking: Reported on 12/21/2017) 7 tablet 0   No current facility-administered medications for this visit.    Facility-Administered Medications Ordered in Other Visits  Medication Dose Route Frequency Provider Last Rate Last Dose  . iopamidol (ISOVUE-300) 61 % injection             PHYSICAL EXAMINATION: ECOG PERFORMANCE STATUS: 1 - Symptomatic but completely ambulatory  Vitals:   12/21/17 1046  BP: 131/75  Pulse: 64  Resp: 18  Temp: 97.7 F (36.5 C)  SpO2: 100%   Filed Weights   12/21/17 1046  Weight: 103 lb 14.4 oz (47.1 kg)     GENERAL:alert, no distress and comfortable SKIN: skin color, texture, turgor are normal, no rashes or significant lesions, except skin dryness on her hands, no skin peeling or ulcers/ (+) toe nail loss EYES: normal, conjunctiva are pink and non-injected, sclera clear OROPHARYNX:no  exudate, no erythema and lips, buccal mucosa, and tongue normal  NECK: supple, thyroid normal size, non-tender, without nodularity LYMPH:  no palpable cervical, axillary, or inguinal lymphadenopathy  LUNGS: clear to auscultation bilaterally with normal breathing effort HEART: regular rate & rhythm, no murmurs; trace bilateral lower extremity edema ABDOMEN:abdomen soft, non-tender and normal bowel sounds. No palpable hepatomegaly. 4 abdominal incisions and midline incision covered with steri strips, healing well, no erythema or drainage Musculoskeletal:no cyanosis of digits and no clubbing  PSYCH: alert & oriented x 3 with fluent speech NEURO: no focal motor/sensory deficits. No tingling or numbness in extremities  LABORATORY DATA:  I have reviewed the data as listed CBC Latest Ref Rng & Units 12/19/2017 10/21/2017 09/30/2017  WBC 3.9 - 10.3 K/uL 5.6 4.1 4.2  Hemoglobin 11.6 - 15.9 g/dL 11.8 10.6(L) 10.5(L)  Hematocrit 34.8 - 46.6 % 35.0 31.0(L) 31.5(L)  Platelets 145 - 400 K/uL 175 173 154    CMP Latest Ref Rng & Units 12/19/2017 10/21/2017 09/30/2017  Glucose 70 - 140 mg/dL 106 89 95  BUN 7 - 26 mg/dL '21 21 17  ' Creatinine 0.60 - 1.10 mg/dL 1.01 0.92 0.94  Sodium 136 - 145 mmol/L 139 141 141  Potassium 3.5 - 5.1 mmol/L 3.7 3.4(L) 4.6  Chloride 98 - 109 mmol/L 105 107 106  CO2 22 - 29 mmol/L '26 26 26  ' Calcium 8.4 - 10.4 mg/dL 9.9 9.9 9.8  Total Protein 6.4 - 8.3 g/dL 7.3 7.1 6.8  Total Bilirubin 0.2 - 1.2 mg/dL 0.6 0.8 1.0  Alkaline Phos 40 - 150 U/L 88 68 77  AST 5 - 34 U/L 107(H) 26 27  ALT 0 - 55 U/L 65(H) 23 21    RADIOGRAPHIC STUDIES: I have personally reviewed the radiological images as listed and agreed with the findings in the report. Ct Abdomen Pelvis W Contrast  Result Date: 12/21/2017 CLINICAL DATA:  Restaging colon cancer. History of prior sigmoid colon cancer resection September 2018. Chemotherapy completed. EXAM: CT ABDOMEN AND PELVIS WITH CONTRAST TECHNIQUE:  Multidetector CT imaging of the abdomen and pelvis  was performed using the standard protocol following bolus administration of intravenous contrast. CONTRAST:  30m ISOVUE-300 IOPAMIDOL (ISOVUE-300) INJECTION 61% COMPARISON:  CT scan 04/19/2017 and MRI abdomen 05/09/2017 FINDINGS: Lower chest: The lung bases are clear of an acute process. No worrisome pulmonary nodules to suggest metastatic disease. The heart is normal in size. No pericardial effusion. Hepatobiliary: Numerous hepatic cysts are again demonstrated. No worrisome hepatic lesions to suggest metastatic disease. The gallbladder demonstrates layering small gallstones. No common bile duct dilatation. Pancreas: No mass, inflammation or ductal dilatation. Spleen: Normal size.  No focal lesions. Adrenals/Urinary Tract: The adrenal glands and kidneys are unremarkable and stable. Scattered small renal cysts. The bladder is grossly normal. Partial obscuration by artifact from a left hip prosthesis. Stomach/Bowel: The stomach, duodenum, small bowel and colon are unremarkable. No acute inflammatory changes, mass lesions or obstructive findings. Surgical changes noted in the sigmoid colon from previous colon resection. No findings suspicious for residual or recurrent tumor. Vascular/Lymphatic: Stable aortic and iliac artery calcifications but no aneurysm or dissection. Moderate tortuosity. The major venous structures are patent. No mesenteric or retroperitoneal mass or adenopathy. No adenopathy in the sigmoid mesocolon or pelvis. Reproductive: Grossly normal. Partial obscuration by left hip prosthesis artifact. 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 Other: No inguinal mass or adenopathy. No abdominal wall hernia or subcutaneous lesions. Musculoskeletal: No acute bony findings. No worrisome bone lesions. Area of sclerosis involving the mid sacrum not present on the prior study. This has the appearance of a healing or healed stress  fracture. Attention on future scans is suggested. Stable widened neural foramen at L4-5 bilaterally likely due to dilated nerve sheath or perineural cysts. IMPRESSION: 1. Surgical changes from a sigmoid colon resection without evidence of recurrent tumor, locoregional adenopathy or metastatic disease. 2. Stable numerous hepatic cysts. 3. Cholelithiasis. 4. Probable healed/healing mid sacral stress fracture. Electronically Signed   By: PMarijo SanesM.D.   On: 12/21/2017 15:39     MR Abdomen w/o contrast 05/09/17 IMPRESSION: 1. No evidence of metastatic colon cancer in the abdomen. 2. Multiple simple hepatic cysts. 3. Known cholelithiasis, not well visualized. No biliary dilatation.  CT CAP 04/19/17 IMPRESSION: 1. No definite evidence of metastatic disease in the chest, abdomen or pelvis. 2. Numerous indeterminate subcentimeter hypodense lesions scattered throughout the liver, too small to characterize, more likely benign. Recommend attention on follow-up MRI (preferred) abdomen without and with IV contrast or CT abdomen with IV contrast in 3-6 months. This recommendation follows ACR consensus guidelines: Managing Incidental Findings on Abdominal CT: White Paper of the ACR Incidental Findings Committee. J Am Coll Radiol 2010;7:754-773. 3. Nonspecific small volume free fluid in the pelvis, probably postsurgical. 4. Chronic findings include: Aortic Atherosclerosis (ICD10-I70.0). Cholelithiasis.   ASSESSMENT & PLAN:  Elizabeth DUNis a 77y.o. female with a history of GI bleed, anemia, HTN, and sigmoid colon cancer    1. Cancer of Sigmoid Colon, invasive adenocarcinoma, grade 3, pT2 pN1b, cM0, stage IIIA -I previously discussed her colonoscopy, imaging, and surgical pathology findings in detail with the patient and family -Staging CT CAP shows no definite metastatic disease but does show numerous indeterminate sub-cm hypodense lesions scattered throughout the liver, too small to characterize  but likely benign -MRI of abdomen on 05/09/17 revealed multiple non-enhancing hepatic cysts but no evidence of metastatic colon cancer in the abdomen -I previously reviewed her moderate risk for cancer recurrence after surgery -adjuvant chemotherapy is recommended, which is the standard care for stage III colon  cancer. She is 75, but overall healthy and fit, no major medical comorbidities, has good performance status. Would be a candidate for chemo.  I previously discussed the option of FOLFOX q2 weeks vs CAPOX q3 weeks with Xeloda daily BID x2 weeks with 1 week off; the patient is interested in pursuing chemotherapy -I suspect she would tolerate FOLFOX better overall, but due to her transportation issues and lack of a car, she prefers CAPOX on q3 week schedule.  -On 05/12/17 she started adjuvant CAPOX.  She tolerated the first cycle poorly, oxaliplatin was stopped afterwards. -She continued adjuvant chemotherapy with Xeloda along, and required dose reduction due to grade 3 hand-foot syndrome -She has completed planned a cycle adjuvant chemo, recovered well.    -She is clinically doing very well, labs reviewed, she has mild transaminitis, otherwise unremarkable.  Her physical exam was unremarkable.  She is scheduled to have a surveillance CT scan this afternoon, alcohol tomorrow with results. --I discussed the risk of cancer recurrence in the future. I discussed the surveillance plan, which is a physical exam and lab test (including CBC, CMP and CEA) every 3 months for the first 2 years, then every 6-12 months, colonoscopy in one year, and surveilliance CT scan every 6-12 month for up to 5 year.    2. Anemia, iron deficiency  -she has been iron deficient in the past, on oral ferrous sulfate 1 tab daily -She received IV Feraheme on 11/8 for iron deficiency anemia and counts recovered. Has not needed more IV Feraheme since.  -HG is 11.8 today, improved since she completed chemo -Will continue oral  ferrous sulfate once daily.   3. Hand and Foot Syndrome, secondary to Xeloda -I prescribed urea cream on (08/25/17) and advised her to continue use of OTC hydrocortisone cream with moisturizer.  -She developed grade 3 hand-foot syndrome after cycle 6. Will hold cycle 7. I will consider dose reduction or altered dosing schedule in the future -Improved after dose reduction. -Resolved now, since she completed adjuvant Xeloda.  4.  Transaminitis -She has developed slightly elevated liver enzymes, possible related to Xeloda, total bilirubin normal, she is asymptomatic. -Follow-up.    PLAN: - Lab and f/u in 3 months  - Lab in 6 weeks for abnormal liver functions.  Okay to cancel if her repeated lab with her primary care physician next week is normal. -Alcohol tomorrow with the CT scan result.   No orders of the defined types were placed in this encounter.   All questions were answered. The patient knows to call the clinic with any problems, questions or concerns.  I spent 20 minutes counseling the patient face to face. The total time spent in the appointment was 25 minutes and more than 50% was on counseling.  Dierdre Searles Dweik am acting as scribe for Dr. Truitt Merle.  I have reviewed the above documentation for accuracy and completeness, and I agree with the above.  Truitt Merle  12/21/2017 5:29 PM

## 2017-12-19 ENCOUNTER — Ambulatory Visit (HOSPITAL_COMMUNITY): Payer: Medicare HMO

## 2017-12-19 ENCOUNTER — Inpatient Hospital Stay: Payer: Medicare HMO | Attending: Hematology

## 2017-12-19 DIAGNOSIS — G620 Drug-induced polyneuropathy: Secondary | ICD-10-CM | POA: Diagnosis not present

## 2017-12-19 DIAGNOSIS — E7800 Pure hypercholesterolemia, unspecified: Secondary | ICD-10-CM | POA: Diagnosis not present

## 2017-12-19 DIAGNOSIS — L271 Localized skin eruption due to drugs and medicaments taken internally: Secondary | ICD-10-CM | POA: Insufficient documentation

## 2017-12-19 DIAGNOSIS — Z87891 Personal history of nicotine dependence: Secondary | ICD-10-CM | POA: Insufficient documentation

## 2017-12-19 DIAGNOSIS — K5909 Other constipation: Secondary | ICD-10-CM | POA: Insufficient documentation

## 2017-12-19 DIAGNOSIS — D638 Anemia in other chronic diseases classified elsewhere: Secondary | ICD-10-CM | POA: Diagnosis not present

## 2017-12-19 DIAGNOSIS — K219 Gastro-esophageal reflux disease without esophagitis: Secondary | ICD-10-CM | POA: Insufficient documentation

## 2017-12-19 DIAGNOSIS — I700 Atherosclerosis of aorta: Secondary | ICD-10-CM | POA: Diagnosis not present

## 2017-12-19 DIAGNOSIS — C187 Malignant neoplasm of sigmoid colon: Secondary | ICD-10-CM | POA: Diagnosis not present

## 2017-12-19 DIAGNOSIS — D500 Iron deficiency anemia secondary to blood loss (chronic): Secondary | ICD-10-CM | POA: Insufficient documentation

## 2017-12-19 DIAGNOSIS — Z9221 Personal history of antineoplastic chemotherapy: Secondary | ICD-10-CM | POA: Insufficient documentation

## 2017-12-19 DIAGNOSIS — K7689 Other specified diseases of liver: Secondary | ICD-10-CM | POA: Insufficient documentation

## 2017-12-19 DIAGNOSIS — Z79899 Other long term (current) drug therapy: Secondary | ICD-10-CM | POA: Diagnosis not present

## 2017-12-19 DIAGNOSIS — T451X5S Adverse effect of antineoplastic and immunosuppressive drugs, sequela: Secondary | ICD-10-CM | POA: Insufficient documentation

## 2017-12-19 DIAGNOSIS — I10 Essential (primary) hypertension: Secondary | ICD-10-CM | POA: Insufficient documentation

## 2017-12-19 DIAGNOSIS — Z8719 Personal history of other diseases of the digestive system: Secondary | ICD-10-CM | POA: Diagnosis not present

## 2017-12-19 LAB — CBC WITH DIFFERENTIAL/PLATELET
Basophils Absolute: 0 10*3/uL (ref 0.0–0.1)
Basophils Relative: 0 %
EOS PCT: 1 %
Eosinophils Absolute: 0 10*3/uL (ref 0.0–0.5)
HEMATOCRIT: 35 % (ref 34.8–46.6)
Hemoglobin: 11.8 g/dL (ref 11.6–15.9)
LYMPHS ABS: 0.5 10*3/uL — AB (ref 0.9–3.3)
LYMPHS PCT: 10 %
MCH: 33.8 pg (ref 25.1–34.0)
MCHC: 33.7 g/dL (ref 31.5–36.0)
MCV: 100.3 fL (ref 79.5–101.0)
MONO ABS: 0.4 10*3/uL (ref 0.1–0.9)
MONOS PCT: 7 %
NEUTROS ABS: 4.6 10*3/uL (ref 1.5–6.5)
Neutrophils Relative %: 82 %
PLATELETS: 175 10*3/uL (ref 145–400)
RBC: 3.49 MIL/uL — ABNORMAL LOW (ref 3.70–5.45)
RDW: 13.3 % (ref 11.2–14.5)
WBC: 5.6 10*3/uL (ref 3.9–10.3)

## 2017-12-19 LAB — IRON AND TIBC
IRON: 33 ug/dL — AB (ref 41–142)
SATURATION RATIOS: 9 % — AB (ref 21–57)
TIBC: 346 ug/dL (ref 236–444)
UIBC: 314 ug/dL

## 2017-12-19 LAB — COMPREHENSIVE METABOLIC PANEL
ALT: 65 U/L — ABNORMAL HIGH (ref 0–55)
AST: 107 U/L — AB (ref 5–34)
Albumin: 4.6 g/dL (ref 3.5–5.0)
Alkaline Phosphatase: 88 U/L (ref 40–150)
Anion gap: 8 (ref 3–11)
BILIRUBIN TOTAL: 0.6 mg/dL (ref 0.2–1.2)
BUN: 21 mg/dL (ref 7–26)
CHLORIDE: 105 mmol/L (ref 98–109)
CO2: 26 mmol/L (ref 22–29)
CREATININE: 1.01 mg/dL (ref 0.60–1.10)
Calcium: 9.9 mg/dL (ref 8.4–10.4)
GFR, EST NON AFRICAN AMERICAN: 53 mL/min — AB (ref >60)
Glucose, Bld: 106 mg/dL (ref 70–140)
POTASSIUM: 3.7 mmol/L (ref 3.5–5.1)
Sodium: 139 mmol/L (ref 136–145)
TOTAL PROTEIN: 7.3 g/dL (ref 6.4–8.3)

## 2017-12-19 LAB — CEA (IN HOUSE-CHCC): CEA (CHCC-In House): 4.39 ng/mL (ref 0.00–5.00)

## 2017-12-19 LAB — FERRITIN: Ferritin: 69 ng/mL (ref 9–269)

## 2017-12-21 ENCOUNTER — Inpatient Hospital Stay (HOSPITAL_BASED_OUTPATIENT_CLINIC_OR_DEPARTMENT_OTHER): Payer: Medicare HMO | Admitting: Hematology

## 2017-12-21 ENCOUNTER — Telehealth: Payer: Self-pay | Admitting: Hematology

## 2017-12-21 ENCOUNTER — Ambulatory Visit (HOSPITAL_COMMUNITY)
Admission: RE | Admit: 2017-12-21 | Discharge: 2017-12-21 | Disposition: A | Discharge status: Home / Self Care | Payer: Medicare HMO | Source: Ambulatory Visit | Attending: Hematology | Admitting: Hematology

## 2017-12-21 ENCOUNTER — Encounter: Payer: Self-pay | Admitting: Hematology

## 2017-12-21 ENCOUNTER — Encounter (HOSPITAL_COMMUNITY): Payer: Self-pay

## 2017-12-21 VITALS — BP 131/75 | HR 64 | Temp 97.7°F | Resp 18 | Ht 64.0 in | Wt 103.9 lb

## 2017-12-21 DIAGNOSIS — K5909 Other constipation: Secondary | ICD-10-CM | POA: Diagnosis not present

## 2017-12-21 DIAGNOSIS — D638 Anemia in other chronic diseases classified elsewhere: Secondary | ICD-10-CM | POA: Diagnosis not present

## 2017-12-21 DIAGNOSIS — D500 Iron deficiency anemia secondary to blood loss (chronic): Secondary | ICD-10-CM

## 2017-12-21 DIAGNOSIS — T451X5S Adverse effect of antineoplastic and immunosuppressive drugs, sequela: Secondary | ICD-10-CM

## 2017-12-21 DIAGNOSIS — K8020 Calculus of gallbladder without cholecystitis without obstruction: Secondary | ICD-10-CM | POA: Insufficient documentation

## 2017-12-21 DIAGNOSIS — C187 Malignant neoplasm of sigmoid colon: Secondary | ICD-10-CM | POA: Insufficient documentation

## 2017-12-21 DIAGNOSIS — Z8719 Personal history of other diseases of the digestive system: Secondary | ICD-10-CM

## 2017-12-21 DIAGNOSIS — E7800 Pure hypercholesterolemia, unspecified: Secondary | ICD-10-CM

## 2017-12-21 DIAGNOSIS — G620 Drug-induced polyneuropathy: Secondary | ICD-10-CM | POA: Diagnosis not present

## 2017-12-21 DIAGNOSIS — L271 Localized skin eruption due to drugs and medicaments taken internally: Secondary | ICD-10-CM

## 2017-12-21 DIAGNOSIS — K7689 Other specified diseases of liver: Secondary | ICD-10-CM | POA: Insufficient documentation

## 2017-12-21 DIAGNOSIS — Z9221 Personal history of antineoplastic chemotherapy: Secondary | ICD-10-CM | POA: Diagnosis not present

## 2017-12-21 DIAGNOSIS — C189 Malignant neoplasm of colon, unspecified: Secondary | ICD-10-CM | POA: Diagnosis not present

## 2017-12-21 DIAGNOSIS — K219 Gastro-esophageal reflux disease without esophagitis: Secondary | ICD-10-CM

## 2017-12-21 DIAGNOSIS — I700 Atherosclerosis of aorta: Secondary | ICD-10-CM

## 2017-12-21 DIAGNOSIS — Z79899 Other long term (current) drug therapy: Secondary | ICD-10-CM

## 2017-12-21 DIAGNOSIS — I10 Essential (primary) hypertension: Secondary | ICD-10-CM

## 2017-12-21 DIAGNOSIS — Z87891 Personal history of nicotine dependence: Secondary | ICD-10-CM

## 2017-12-21 MED ORDER — IOPAMIDOL (ISOVUE-300) INJECTION 61%
100.0000 mL | Freq: Once | INTRAVENOUS | Status: AC | PRN
  Administered 2017-12-21: 80 mL via INTRAVENOUS

## 2017-12-21 MED ORDER — IOPAMIDOL (ISOVUE-300) INJECTION 61%
INTRAVENOUS | Status: AC
  Filled 2017-12-21: qty 100

## 2017-12-21 NOTE — Telephone Encounter (Signed)
Scheduled appt per 6/19 los - gave patient AVS and calender per los.  

## 2017-12-22 ENCOUNTER — Telehealth: Payer: Self-pay

## 2017-12-22 NOTE — Telephone Encounter (Signed)
Per Dr. Burr Medico left voice message regarding CT scan results.  No evidence of recurrence of cancer.   Encouraged patient to call back if she has questions.

## 2017-12-22 NOTE — Telephone Encounter (Signed)
-----   Message from Truitt Merle, MD sent at 12/22/2017  8:37 AM EDT ----- Please let pt know the CT scan results, no evidence of cancer recurrence, good news, thanks  Truitt Merle  12/22/2017

## 2017-12-28 ENCOUNTER — Other Ambulatory Visit: Payer: Self-pay | Admitting: *Deleted

## 2017-12-28 ENCOUNTER — Telehealth: Payer: Self-pay | Admitting: *Deleted

## 2017-12-28 DIAGNOSIS — Z681 Body mass index (BMI) 19 or less, adult: Secondary | ICD-10-CM | POA: Diagnosis not present

## 2017-12-28 DIAGNOSIS — I10 Essential (primary) hypertension: Secondary | ICD-10-CM | POA: Diagnosis not present

## 2017-12-28 DIAGNOSIS — D509 Iron deficiency anemia, unspecified: Secondary | ICD-10-CM | POA: Diagnosis not present

## 2017-12-28 DIAGNOSIS — C187 Malignant neoplasm of sigmoid colon: Secondary | ICD-10-CM

## 2017-12-28 DIAGNOSIS — Z1339 Encounter for screening examination for other mental health and behavioral disorders: Secondary | ICD-10-CM | POA: Diagnosis not present

## 2017-12-28 DIAGNOSIS — E785 Hyperlipidemia, unspecified: Secondary | ICD-10-CM | POA: Diagnosis not present

## 2017-12-28 MED ORDER — UNABLE TO FIND: 1.0000 [IU] | Freq: Once | 0 refills | Status: AC

## 2017-12-28 NOTE — Telephone Encounter (Signed)
Spoke with [facility] @ [facility] requesting lab orders for pt.  Per Nira Conn, pt would like to have repeated labs done at [facility] clinic since it is closer to her home.   [facility]     Phone        [phone]         ;      Fax       [phone].

## 2018-02-01 ENCOUNTER — Inpatient Hospital Stay: Payer: Medicare HMO | Attending: Hematology

## 2018-02-01 DIAGNOSIS — E785 Hyperlipidemia, unspecified: Secondary | ICD-10-CM | POA: Diagnosis not present

## 2018-02-01 DIAGNOSIS — D509 Iron deficiency anemia, unspecified: Secondary | ICD-10-CM | POA: Diagnosis not present

## 2018-02-06 ENCOUNTER — Telehealth: Payer: Self-pay | Admitting: Hematology

## 2018-02-06 NOTE — Telephone Encounter (Signed)
I received her lab report from her PCP which showed normal CBC and CMP, her previous transaminitis has resolved, no concerns. Will see her back in Sep as scheduled.   Elizabeth Durham  02/06/2018

## 2018-02-24 DIAGNOSIS — E785 Hyperlipidemia, unspecified: Secondary | ICD-10-CM | POA: Diagnosis not present

## 2018-02-24 DIAGNOSIS — Z139 Encounter for screening, unspecified: Secondary | ICD-10-CM | POA: Diagnosis not present

## 2018-02-24 DIAGNOSIS — Z9181 History of falling: Secondary | ICD-10-CM | POA: Diagnosis not present

## 2018-02-24 DIAGNOSIS — Z0000 Encounter for general adult medical examination without abnormal findings: Secondary | ICD-10-CM | POA: Diagnosis not present

## 2018-02-24 DIAGNOSIS — Z1231 Encounter for screening mammogram for malignant neoplasm of breast: Secondary | ICD-10-CM | POA: Diagnosis not present

## 2018-02-24 DIAGNOSIS — Z136 Encounter for screening for cardiovascular disorders: Secondary | ICD-10-CM | POA: Diagnosis not present

## 2018-02-24 DIAGNOSIS — Z1331 Encounter for screening for depression: Secondary | ICD-10-CM | POA: Diagnosis not present

## 2018-03-14 DIAGNOSIS — Z1231 Encounter for screening mammogram for malignant neoplasm of breast: Secondary | ICD-10-CM | POA: Diagnosis not present

## 2018-03-16 ENCOUNTER — Telehealth: Payer: Self-pay | Admitting: Hematology

## 2018-03-16 NOTE — Telephone Encounter (Signed)
YF PAL 9/19. Moved appointments to 9/30. Left message for patient. Schedule mailed.

## 2018-03-23 ENCOUNTER — Ambulatory Visit: Payer: Medicare HMO | Admitting: Hematology

## 2018-03-23 ENCOUNTER — Other Ambulatory Visit: Payer: Medicare HMO

## 2018-04-02 NOTE — Progress Notes (Signed)
[place]  [phone] [phone]  Clinic Follow up Note   Patient Care Team: Cyndi Bender, PA-C as PCP - General (Physician Assistant) [name], MD as Consulting Physician (General Surgery) Truitt Merle, MD as Consulting Physician (Hematology) Alla Feeling, NP as Nurse Practitioner (Nurse Practitioner)   Date of Service:  04/02/2018   CHIEF COMPLAINTS:  Follow up cancer of Sigmoid Colon   Oncology History   Cancer Staging Cancer of sigmoid colon [facility]) Staging form: Colon and Rectum, AJCC 8th Edition - Pathologic stage from 04/07/2017: Stage IIIA (pT2, pN1b, cM0) - Signed by Truitt Merle, MD on 04/26/2017       Cancer of sigmoid colon ([place])   03/22/2017 Procedure    Colonoscopy per Dr. Marian Sorrow A rectal examination was performed and was normal. The endoscope was then inserted into the rectum and advanced under direct visualization to about 25 cm where a large, near obstructing mass was noted. This had a malignant appearance. It was biopsied. That area could not be traversed with the adult colonoscope. Small amount of bleeding was noted from the biopsy site, but this stopped right away. 7 mm pedunculated polyp was noted at 15 cm. This was removed using hot snare and retrieved for pathology. Ink was injected into the submucosa at 20 cm (5 cm distal to the mass). The endoscope was then withdrawn and the procedure terminated.   Findings / Comments: - Mass at 25 cm. Biopsied. - Polyp removed as above.     04/07/2017 Initial Diagnosis    Cancer of sigmoid colon ([place])    04/07/2017 Pathology Results    Diagnosis Colon, segmental resection for tumor, Sigmoid - INVASIVE COLORECTAL ADENOCARCINOMA, 2.5 CM. - CARCINOMA INVADES MUSCULARIS PROPRIA. - MARGINS NOT INVOLVED. - METASTATIC CARCINOMA IN TWO OF EIGHTEEN LYMPH NODES (2/18) - PROXIMAL AND DISTAL ANASTOMOTIC RINGS FREE OF TUMOR.  Specimen: Sigmoid colon with proximal and distal anastomotic  rings. Procedure: Resection. Tumor site: Sigmoid colon. Specimen integrity: Intact. Macroscopic intactness of mesorectum: N/A Macroscopic tumor perforation: No. Invasive tumor: Maximum size: 2.5 cm. Histologic type(s): Colorectal adenocarcinoma. Histologic grade and differentiation: G3 G1: well differentiated/low grade G2: moderately differentiated/low grade G3: poorly differentiated/high grade G4: undifferentiated/high grade Type of polyp in which invasive carcinoma arose: No residual polyp. Microscopic extension of invasive tumor: Into muscularis propria. Lymph-Vascular invasion: Present. Peri-neural invasion: Present. Tumor deposit(s) (discontinuous extramural extension): No.  Resection margins: Proximal margin: Free of tumor. Distal margin: Free of tumor. Circumferential (radial) (posterior ascending, posterior descending; lateral and posterior mid-rectum; and entire lower 1/3 rectum): N/A Mesenteric margin (sigmoid and transverse): Free of tumor. Distance closest margin (if all above margins negative): 6 cm from mesenteric margin. Treatment effect (neo-adjuvant therapy): No. Additional polyp(s): No. Non-neoplastic findings: N/A Lymph nodes: number examined - 18; number positive: 2 Pathologic Staging: pT2, pN1b, pMX Ancillary studies: MSI by PCR and MMR by IHC. (JDP:gt, 04/11/17)    04/07/2017 Surgery    Laparoscopic sigmoid colectomy per Dr. Barry Dienes    04/19/2017 Imaging    CT chest, abdomen and pelvis with contrast showed no definitive evidence of metastasis, numerous indeterminate subcentimeter hypodense lesions scattered throughout the liver, likely benign.    04/27/2017 Tumor Marker    CEA Pre-op 04/04/17: 1.4 Post-op 04/27/17: 1.27    05/09/2017 Imaging    MRI Abdomen IMPRESSION: 1. No evidence of metastatic colon cancer in the abdomen. 2. Multiple simple hepatic cysts. 3. Known cholelithiasis, not well visualized. No biliary dilatation.     05/12/2017 -  11/2017 Chemotherapy    CAPOX q3weeks with Xeloda 102m BID, 2 weeks on 1 week off. Starting 05/12/17. Xeloda held on 05/19/17 due to poor toleration. Proceed with single agent Xeloda 1007mBID starting with Cycle 2 on 06/04/17. Started cycle 3 Xeloda 1500 mg BID x14 days/7 days off on 06/25/17. Cycle 7 held on 09/16/17 due to Grade 3 hand foot syndrome. Reduced dose with Cycle 7 Xeloda 1500 mg in the morning and 1000 mg in the evening on 09/30/17.     05/19/2017 Tumor Marker    CEA: 1.35    06/23/2017 Tumor Marker    CEA: 1.31    12/21/2017 Imaging    CT AP W Contrast 12/21/17  IMPRESSION: 1. Surgical changes from a sigmoid colon resection without evidence of recurrent tumor, locoregional adenopathy or metastatic disease. 2. Stable numerous hepatic cysts. 3. Cholelithiasis. 4. Probable healed/healing mid sacral stress fracture.      HISTORY OF PRESENTING ILLNESS: 04/27/17  Elizabeth [age]ile[age] female is here because of newly diagnosed colon cancer. She presents to the clinic today accompanied by her daughter. She was referred by Dr. ByBarry DienesIn 12/2015 [facility] and acute blood loss anemia, she was also found to be iron deficient. Anemia improved with blood transfusion and she began taking oral iron supplementation. A colonoscopy was scheduled but ultimately cancelled because she could not tolerate prep. It was not rescheduled. She reports chronic constipation with intermittent blood on tissue after hard BM.  She previously lost weight but has gained it back. Colonoscopy was eventually rescheduled and performed on 03/22/2017 by Dr. BiMarian Sorrowo follow up on GI bleeding and because she had never had one. Colonoscopy revealed a malignant appearing near obstructing mass at about 25 cm, and a pedunculated polyp at 15 cm; both were biopsied. Colon mass pathology revealed scant fragments of intramucosal adenocarcinoma arising in an adenomatous polyp with high  grade dysplasia; polyp biopsy revealed tubulovillous adeno suspicious for high grade dysplasia. She was referred to Dr. ByBarry Dienesor segmental resection on 04/07/17, pathology revealed invasive adenocarcinoma 2.5cm that invades the muscularis propria, margins were negative, with metastatic disease in 2 of 18 lymph nodes.   Today she is recovering well from surgery. She has good appetite. Continues to have constipation, has not used medication; last BM 1 day ago. She denies pain, nausea, vomiting, blood in stool, or fatigue.  Patient is not currently working. She walks around the block and does yard work for exercise. She performs ADL's independently, lives with her brother and daughter; she is without a car, transportation is unreliable. Family has to borrow friend's car for rides.    CURRENT THERAPY: Surveillance.  INTERVAL THERAPY:  Elizabeth Durham here for a follow up for her sigmoid colon cancer. She presents to the clinic today with her family member.   She is doing well. Her appetite is good and she denies abdominal pain, bloating, black stools, or changes in BMs. Her weight stable, she feels she has recovered completely from chemo.     REVIEW OF SYSTEMS:  Constitutional: Denies fatigue, fevers, chills or abnormal night sweats  Eyes: Denies blurriness of vision, double vision or watery eyes Ears, nose, mouth, throat, and face: Denies mucositis or sore throat Respiratory: Denies cough, dyspnea or wheezes Cardiovascular: Denies palpitation, chest discomfort  Gastrointestinal:  Denies nausea, vomiting, diarrhea, heartburn, blood in stool, abdominal pain, or change in bowel habits  Skin: (+) left hand finger wart  Lymphatics: Denies new lymphadenopathy or easy bruising Neurological:Denies numbness, tingling or new weaknesses Behavioral/Psych: Mood is stable, no new changes  All other systems were reviewed with the patient and are negative.   MEDICAL HISTORY:  Past Medical History:    Diagnosis Date  . Essential hypertension   . GERD (gastroesophageal reflux disease)   . GI bleed   . High cholesterol   . Iron deficiency anemia     SURGICAL HISTORY: Past Surgical History:  Procedure Laterality Date  . COLONOSCOPY    . LAPAROSCOPIC SIGMOID COLECTOMY N/A 04/07/2017   Procedure: LAPAROSCOPIC SIGMOID COLECTOMY, ERAS PATHWAY, splenic flexure takedown;  Surgeon: [name], MD;  Location: [place];  Service: General;  Laterality: N/A;  . TONSILLECTOMY    . TOTAL HIP ARTHROPLASTY Right 2012    SOCIAL HISTORY: Social History   Socioeconomic History  . Marital status: Widowed    Spouse name: Not on file  . Number of children: Not on file  . Years of education: Not on file  . Highest education level: Not on file  Occupational History  . Not on file  Social Needs  . Financial resource strain: Not on file  . Food insecurity:    Worry: Not on file    Inability: Not on file  . Transportation needs:    Medical: Not on file    Non-medical: Not on file  Tobacco Use  . Smoking status: Former Smoker    Packs/day: 0.50    Years: 7.00    Pack years: 3.50    Types: Cigarettes    Last attempt to quit: 1983    Years since quitting: 36.7  . Smokeless tobacco: Never Used  Substance and Sexual Activity  . Alcohol use: No    Alcohol/week: 0.0 standard drinks  . Drug use: No  . Sexual activity: Not on file  Lifestyle  . Physical activity:    Days per week: Not on file    Minutes per session: Not on file  . Stress: Not on file  Relationships  . Social connections:    Talks on phone: Not on file    Gets together: Not on file    Attends religious service: Not on file    Active member of club or organization: Not on file    Attends meetings of clubs or organizations: Not on file    Relationship status: Not on file  . Intimate partner violence:    Fear of current or ex partner: Not on file    Emotionally abused: Not on file    Physically abused: Not on file     Forced sexual activity: Not on file  Other Topics Concern  . Not on file  Social History Narrative  . Not on file    FAMILY HISTORY: Family History  Problem Relation Age of Onset  . Leukemia Mother   . Cancer Father        unknown type  Negative family history for GI cancer; her father had unknown type of cancer   ALLERGIES:  has No Known Allergies.  MEDICATIONS:  Current Outpatient Medications  Medication Sig Dispense Refill  . alendronate (FOSAMAX) 70 MG tablet Take 70 mg by mouth every 7 (seven) days.  11  . amLODipine (NORVASC) 10 MG tablet Take 10 mg by mouth daily. for blood pressure  1  . ammonium lactate (AMLACTIN) 12 % cream Apply topically as needed for dry skin. 385 g 0  . atorvastatin (LIPITOR) 10 MG tablet Take 10  mg by mouth daily.  3  . ferrous sulfate 325 (65 FE) MG tablet Take 325 mg by mouth daily.  1  . pantoprazole (PROTONIX) 40 MG tablet Take 1 tablet (40 mg total) by mouth daily. 30 tablet 0  . potassium chloride SA (KLOR-CON M20) 20 MEQ tablet Take 1 tablet (20 mEq total) by mouth daily. (Patient not taking: Reported on 12/21/2017) 7 tablet 0  . urea (CARMOL) 10 % cream Apply topically 2 (two) times daily. Apply to palms twice daily 71 g 1   No current facility-administered medications for this visit.     PHYSICAL EXAMINATION: ECOG PERFORMANCE STATUS: 1 - Symptomatic but completely ambulatory  Vitals:   04/03/18 1125  BP: 135/72  Pulse: (!) 54  Resp: 16  Temp: 97.8 F (36.6 C)  SpO2: 99%   Filed Weights   04/03/18 1125  Weight: 105 lb 1.6 oz (47.7 kg)     GENERAL:alert, no distress and comfortable SKIN: skin color, texture, turgor are normal, no rashes (+) left hand finger wart, about 3-4 cm  EYES: normal, conjunctiva are pink and non-injected, sclera clear OROPHARYNX:no exudate, no erythema and lips, buccal mucosa, and tongue normal  NECK: supple, thyroid normal size, non-tender, without nodularity LYMPH:  no palpable cervical, axillary, or  inguinal lymphadenopathy  LUNGS: clear to auscultation bilaterally with normal breathing effort HEART: regular rate & rhythm, no murmurs; trace bilateral lower extremity edema ABDOMEN:abdomen soft, non-tender and normal bowel sounds. No palpable hepatomegaly. (+) 4 abdominal incisions and midline incision have healed completely  Musculoskeletal:no cyanosis of digits and no clubbing  PSYCH: alert & oriented x 3 with fluent speech NEURO: no focal motor/sensory deficits. No tingling or numbness in extremities  LABORATORY DATA:  I have reviewed the data as listed CBC Latest Ref Rng & Units 04/03/2018 12/19/2017 10/21/2017  WBC 3.9 - 10.3 K/uL 4.4 5.6 4.1  Hemoglobin 11.6 - 15.9 g/dL 13.2 11.8 10.6(L)  Hematocrit 34.8 - 46.6 % 39.3 35.0 31.0(L)  Platelets 145 - 400 K/uL 175 175 173    CMP Latest Ref Rng & Units 04/03/2018 12/19/2017 10/21/2017  Glucose 70 - 99 mg/dL 99 106 89  BUN 8 - 23 mg/dL '14 21 21  ' Creatinine 0.44 - 1.00 mg/dL 0.98 1.01 0.92  Sodium 135 - 145 mmol/L 142 139 141  Potassium 3.5 - 5.1 mmol/L 3.5 3.7 3.4(L)  Chloride 98 - 111 mmol/L 106 105 107  CO2 22 - 32 mmol/L '25 26 26  ' Calcium 8.9 - 10.3 mg/dL 9.6 9.9 9.9  Total Protein 6.5 - 8.1 g/dL 7.8 7.3 7.1  Total Bilirubin 0.3 - 1.2 mg/dL 0.8 0.6 0.8  Alkaline Phos 38 - 126 U/L 60 88 68  AST 15 - 41 U/L 20 107(H) 26  ALT 0 - 44 U/L 15 65(H) 23    RADIOGRAPHIC STUDIES: I have personally reviewed the radiological images as listed and agreed with the findings in the report. No results found.   CT AP W Contrast 12/21/17  IMPRESSION: 1. Surgical changes from a sigmoid colon resection without evidence of recurrent tumor, locoregional adenopathy or metastatic disease. 2. Stable numerous hepatic cysts. 3. Cholelithiasis. 4. Probable healed/healing mid sacral stress fracture.  MR Abdomen w/o contrast 05/09/17 IMPRESSION: 1. No evidence of metastatic colon cancer in the abdomen. 2. Multiple simple hepatic cysts. 3. Known  cholelithiasis, not well visualized. No biliary dilatation.  CT CAP 04/19/17 IMPRESSION: 1. No definite evidence of metastatic disease in the chest, abdomen or pelvis. 2. Numerous indeterminate  subcentimeter hypodense lesions scattered throughout the liver, too small to characterize, more likely benign. Recommend attention on follow-up MRI (preferred) abdomen without and with IV contrast or CT abdomen with IV contrast in 3-6 months. This recommendation follows ACR consensus guidelines: Managing Incidental Findings on Abdominal CT: White Paper of the ACR Incidental Findings Committee. J Am Coll Radiol 2010;7:754-773. 3. Nonspecific small volume free fluid in the pelvis, probably postsurgical. 4. Chronic findings include: Aortic Atherosclerosis (ICD10-I70.0). Cholelithiasis.   ASSESSMENT & PLAN:  Elizabeth Durham is a 77 y.o. female with a history of GI bleed, anemia, HTN, and sigmoid colon cancer    1. Cancer of Sigmoid Colon, invasive adenocarcinoma, grade 3, pT2 pN1b, cM0, stage IIIA -I previously discussed her colonoscopy, imaging, and surgical pathology findings in detail with the patient and family -Staging CT CAP shows no definite metastatic disease but does show numerous indeterminate sub-cm hypodense lesions scattered throughout the liver, too small to characterize but likely benign -MRI of abdomen on 05/09/17 revealed multiple non-enhancing hepatic cysts but no evidence of metastatic colon cancer in the abdomen -I previously reviewed her moderate risk for cancer recurrence after surgery -she received adjuvant chemo CAPOX but tolerated poorly and oxaliplatin held after first cycle. She continued adjuvant chemotherapy with Xeloda along, and required dose reduction due to grade 3 hand-foot syndrome. She completed adjuvant chemo in 11/2017, recovered well.    -Her surveillance CT AP from 12/21/17 showed no evidence of recurrence or metastasis. She has kidney stones and numerous stable hepatic  cysts.  --I previously the risk of cancer recurrence in the future. I discussed the surveillance plan, which is a physical exam and lab test (including CBC, CMP and CEA) every 3-4 months for the first 2 years, then every 6-12 months, colonoscopy in one year, and surveilliance CT scan every 6-12 month for up to 5 year.  -She is clinically doing very well, labs reviewed, CBC and CMP are WNLs. Iron studies and CEA are pending. Her physical exam was unremarkable.  No clinical concerns for recurrence  -f/u in 3 months with lab   2. Anemia, iron deficiency  -she has been iron deficient in the past, on oral ferrous sulfate 1 tab daily -She received IV Feraheme on 11/8 for iron deficiency anemia and counts recovered. Has not needed more IV Feraheme since.  -HG is 13.2 , anemia resolved  -she has stopped oral iron    3. Transaminitis -She previously  developed slightly elevated liver enzymes, possible related to Xeloda, total bilirubin normal, she is asymptomatic. -Follow-up, resolved now   4. Left hand finger wart -I discussed that it is likely due to a viral infection, not cancer. -She stat wes that it is getting bigger and sometimes bleeds with pressure.  -I advised her to see her PCP to get it checked and removed.   PLAN: -f/u  in 3 months with lab and CT CAP with contrast a few days before    No orders of the defined types were placed in this encounter.   All questions were answered. The patient knows to call the clinic with any problems, questions or concerns.  I spent 20 minutes counseling the patient face to face. The total time spent in the appointment was 25 minutes and more than 50% was on counseling.  Dierdre Searles Dweik am acting as scribe for Dr. Truitt Merle.  I have reviewed the above documentation for accuracy and completeness, and I agree with the above.    Truitt Merle  04/02/2018 12:46 PM

## 2018-04-03 ENCOUNTER — Telehealth: Payer: Self-pay

## 2018-04-03 ENCOUNTER — Inpatient Hospital Stay: Payer: Medicare HMO | Attending: Hematology

## 2018-04-03 ENCOUNTER — Inpatient Hospital Stay (HOSPITAL_BASED_OUTPATIENT_CLINIC_OR_DEPARTMENT_OTHER): Payer: Medicare HMO | Admitting: Hematology

## 2018-04-03 ENCOUNTER — Telehealth: Payer: Self-pay | Admitting: Hematology

## 2018-04-03 VITALS — BP 135/72 | HR 54 | Temp 97.8°F | Resp 16 | Ht 64.0 in | Wt 105.1 lb

## 2018-04-03 DIAGNOSIS — K7689 Other specified diseases of liver: Secondary | ICD-10-CM | POA: Insufficient documentation

## 2018-04-03 DIAGNOSIS — N200 Calculus of kidney: Secondary | ICD-10-CM

## 2018-04-03 DIAGNOSIS — Z862 Personal history of diseases of the blood and blood-forming organs and certain disorders involving the immune mechanism: Secondary | ICD-10-CM | POA: Diagnosis not present

## 2018-04-03 DIAGNOSIS — Z87891 Personal history of nicotine dependence: Secondary | ICD-10-CM | POA: Diagnosis not present

## 2018-04-03 DIAGNOSIS — R740 Nonspecific elevation of levels of transaminase and lactic acid dehydrogenase [LDH]: Secondary | ICD-10-CM | POA: Diagnosis not present

## 2018-04-03 DIAGNOSIS — Z9221 Personal history of antineoplastic chemotherapy: Secondary | ICD-10-CM

## 2018-04-03 DIAGNOSIS — C187 Malignant neoplasm of sigmoid colon: Secondary | ICD-10-CM | POA: Diagnosis not present

## 2018-04-03 DIAGNOSIS — B079 Viral wart, unspecified: Secondary | ICD-10-CM | POA: Insufficient documentation

## 2018-04-03 LAB — CBC WITH DIFFERENTIAL/PLATELET
Basophils Absolute: 0 K/uL (ref 0.0–0.1)
Basophils Relative: 0 %
Eosinophils Absolute: 0.1 K/uL (ref 0.0–0.5)
Eosinophils Relative: 2 %
HCT: 39.3 % (ref 34.8–46.6)
Hemoglobin: 13.2 g/dL (ref 11.6–15.9)
Lymphocytes Relative: 13 %
Lymphs Abs: 0.6 K/uL — ABNORMAL LOW (ref 0.9–3.3)
MCH: 30.7 pg (ref 25.1–34.0)
MCHC: 33.5 g/dL (ref 31.5–36.0)
MCV: 91.5 fL (ref 79.5–101.0)
Monocytes Absolute: 0.4 K/uL (ref 0.1–0.9)
Monocytes Relative: 8 %
Neutro Abs: 3.4 K/uL (ref 1.5–6.5)
Neutrophils Relative %: 77 %
Platelets: 175 K/uL (ref 145–400)
RBC: 4.3 MIL/uL (ref 3.70–5.45)
RDW: 12.9 % (ref 11.2–14.5)
WBC: 4.4 K/uL (ref 3.9–10.3)

## 2018-04-03 LAB — COMPREHENSIVE METABOLIC PANEL
ALT: 15 U/L (ref 0–44)
ANION GAP: 11 (ref 5–15)
AST: 20 U/L (ref 15–41)
Albumin: 4.5 g/dL (ref 3.5–5.0)
Alkaline Phosphatase: 60 U/L (ref 38–126)
BUN: 14 mg/dL (ref 8–23)
CO2: 25 mmol/L (ref 22–32)
Calcium: 9.6 mg/dL (ref 8.9–10.3)
Chloride: 106 mmol/L (ref 98–111)
Creatinine, Ser: 0.98 mg/dL (ref 0.44–1.00)
GFR calc non Af Amer: 55 mL/min — ABNORMAL LOW (ref >60)
GLUCOSE: 99 mg/dL (ref 70–99)
POTASSIUM: 3.5 mmol/L (ref 3.5–5.1)
SODIUM: 142 mmol/L (ref 135–145)
TOTAL PROTEIN: 7.8 g/dL (ref 6.5–8.1)
Total Bilirubin: 0.8 mg/dL (ref 0.3–1.2)

## 2018-04-03 LAB — IRON AND TIBC
Iron: 95 ug/dL (ref 41–142)
Saturation Ratios: 26 % (ref 21–57)
TIBC: 360 ug/dL (ref 236–444)
UIBC: 265 ug/dL

## 2018-04-03 LAB — CEA (IN HOUSE-CHCC): CEA (CHCC-IN HOUSE): 2.1 ng/mL (ref 0.00–5.00)

## 2018-04-03 LAB — FERRITIN: Ferritin: 41 ng/mL (ref 11–307)

## 2018-04-03 NOTE — Telephone Encounter (Signed)
Scheduled appt per 9/30 los - gave patient AVS and calender per los.  Central radiology to contact patient with appt date and time for scan .

## 2018-04-03 NOTE — Telephone Encounter (Signed)
-----   Message from Alla Feeling, NP sent at 04/03/2018  3:44 PM EDT ----- Please let her know iron studies and CEA are normal.  Thanks, Regan Rakers NP

## 2018-04-03 NOTE — Telephone Encounter (Signed)
Spoke with patient per Cira Rue NP with lab results, iron studies and CEA are normal, patient verbalized an understandin.

## 2018-04-04 ENCOUNTER — Encounter: Payer: Self-pay | Admitting: Hematology

## 2018-04-05 DIAGNOSIS — S65513A Laceration of blood vessel of left middle finger, initial encounter: Secondary | ICD-10-CM | POA: Diagnosis not present

## 2018-04-05 DIAGNOSIS — L988 Other specified disorders of the skin and subcutaneous tissue: Secondary | ICD-10-CM | POA: Diagnosis not present

## 2018-04-05 DIAGNOSIS — R58 Hemorrhage, not elsewhere classified: Secondary | ICD-10-CM | POA: Diagnosis not present

## 2018-04-11 DIAGNOSIS — Z681 Body mass index (BMI) 19 or less, adult: Secondary | ICD-10-CM | POA: Diagnosis not present

## 2018-04-11 DIAGNOSIS — L989 Disorder of the skin and subcutaneous tissue, unspecified: Secondary | ICD-10-CM | POA: Diagnosis not present

## 2018-04-25 DIAGNOSIS — L989 Disorder of the skin and subcutaneous tissue, unspecified: Secondary | ICD-10-CM | POA: Diagnosis not present

## 2018-06-30 ENCOUNTER — Other Ambulatory Visit: Payer: Self-pay | Admitting: Hematology

## 2018-06-30 ENCOUNTER — Inpatient Hospital Stay: Payer: Medicare HMO | Attending: Hematology

## 2018-06-30 ENCOUNTER — Ambulatory Visit (HOSPITAL_COMMUNITY)
Admission: RE | Admit: 2018-06-30 | Discharge: 2018-06-30 | Disposition: A | Discharge status: Home / Self Care | Payer: Medicare HMO | Source: Ambulatory Visit | Attending: Hematology | Admitting: Hematology

## 2018-06-30 DIAGNOSIS — C187 Malignant neoplasm of sigmoid colon: Secondary | ICD-10-CM | POA: Insufficient documentation

## 2018-06-30 DIAGNOSIS — Z79899 Other long term (current) drug therapy: Secondary | ICD-10-CM | POA: Insufficient documentation

## 2018-06-30 DIAGNOSIS — D509 Iron deficiency anemia, unspecified: Secondary | ICD-10-CM | POA: Insufficient documentation

## 2018-06-30 DIAGNOSIS — I10 Essential (primary) hypertension: Secondary | ICD-10-CM | POA: Diagnosis not present

## 2018-06-30 DIAGNOSIS — C189 Malignant neoplasm of colon, unspecified: Secondary | ICD-10-CM | POA: Diagnosis not present

## 2018-06-30 DIAGNOSIS — Z9221 Personal history of antineoplastic chemotherapy: Secondary | ICD-10-CM | POA: Diagnosis not present

## 2018-06-30 LAB — CBC WITH DIFFERENTIAL (CANCER CENTER ONLY)
Abs Immature Granulocytes: 0.01 10*3/uL (ref 0.00–0.07)
BASOS ABS: 0 10*3/uL (ref 0.0–0.1)
BASOS PCT: 0 %
Eosinophils Absolute: 0.1 10*3/uL (ref 0.0–0.5)
Eosinophils Relative: 2 %
HCT: 40.3 % (ref 36.0–46.0)
Hemoglobin: 13.2 g/dL (ref 12.0–15.0)
Immature Granulocytes: 0 %
Lymphocytes Relative: 17 %
Lymphs Abs: 0.7 10*3/uL (ref 0.7–4.0)
MCH: 30.1 pg (ref 26.0–34.0)
MCHC: 32.8 g/dL (ref 30.0–36.0)
MCV: 91.8 fL (ref 80.0–100.0)
Monocytes Absolute: 0.5 10*3/uL (ref 0.1–1.0)
Monocytes Relative: 13 %
Neutro Abs: 2.8 10*3/uL (ref 1.7–7.7)
Neutrophils Relative %: 68 %
Platelet Count: 204 10*3/uL (ref 150–400)
RBC: 4.39 MIL/uL (ref 3.87–5.11)
RDW: 12.3 % (ref 11.5–15.5)
WBC Count: 4.1 10*3/uL (ref 4.0–10.5)
nRBC: 0 % (ref 0.0–0.2)

## 2018-06-30 LAB — CMP (CANCER CENTER ONLY)
ALK PHOS: 64 U/L (ref 38–126)
ALT: 16 U/L (ref 0–44)
AST: 21 U/L (ref 15–41)
Albumin: 4.6 g/dL (ref 3.5–5.0)
Anion gap: 10 (ref 5–15)
BILIRUBIN TOTAL: 1.1 mg/dL (ref 0.3–1.2)
BUN: 22 mg/dL (ref 8–23)
CALCIUM: 9.7 mg/dL (ref 8.9–10.3)
CO2: 26 mmol/L (ref 22–32)
CREATININE: 1.03 mg/dL — AB (ref 0.44–1.00)
Chloride: 105 mmol/L (ref 98–111)
GFR, EST NON AFRICAN AMERICAN: 53 mL/min — AB (ref >60)
Glucose, Bld: 107 mg/dL — ABNORMAL HIGH (ref 70–99)
Potassium: 3.5 mmol/L (ref 3.5–5.1)
Sodium: 141 mmol/L (ref 135–145)
Total Protein: 7.9 g/dL (ref 6.5–8.1)

## 2018-06-30 LAB — CEA (IN HOUSE-CHCC): CEA (CHCC-In House): 2.05 ng/mL (ref 0.00–5.00)

## 2018-06-30 MED ORDER — IOHEXOL 300 MG/ML  SOLN
100.0000 mL | Freq: Once | INTRAMUSCULAR | Status: AC | PRN
  Administered 2018-06-30: 100 mL via INTRAVENOUS

## 2018-06-30 MED ORDER — SODIUM CHLORIDE (PF) 0.9 % IJ SOLN
INTRAMUSCULAR | Status: AC
  Filled 2018-06-30: qty 50

## 2018-06-30 NOTE — Progress Notes (Signed)
[place]   [phone] [phone]   Clinic Follow up Note   Patient Care Team: Cyndi Bender, PA-C as PCP - General (Physician Assistant) [name], MD as Consulting Physician (General Surgery) Truitt Merle, MD as Consulting Physician (Hematology) Alla Feeling, NP as Nurse Practitioner (Nurse Practitioner)  Date of Service:  07/03/2018  CHIEF COMPLAINT: F/u of Sigmoid Cancer  SUMMARY OF ONCOLOGIC HISTORY: Oncology History   Cancer Staging Cancer of sigmoid colon [facility]) Staging form: Colon and Rectum, AJCC 8th Edition - Pathologic stage from 04/07/2017: Stage IIIA (pT2, pN1b, cM0) - Signed by Truitt Merle, MD on 04/26/2017       Cancer of sigmoid colon ([place])   03/22/2017 Procedure    Colonoscopy per Dr. Marian Sorrow A rectal examination was performed and was normal. The endoscope was then inserted into the rectum and advanced under direct visualization to about 25 cm where a large, near obstructing mass was noted. This had a malignant appearance. It was biopsied. That area could not be traversed with the adult colonoscope. Small amount of bleeding was noted from the biopsy site, but this stopped right away. 7 mm pedunculated polyp was noted at 15 cm. This was removed using hot snare and retrieved for pathology. Ink was injected into the submucosa at 20 cm (5 cm distal to the mass). The endoscope was then withdrawn and the procedure terminated.   Findings / Comments: - Mass at 25 cm. Biopsied. - Polyp removed as above.     04/07/2017 Initial Diagnosis    Cancer of sigmoid colon ([place])    04/07/2017 Pathology Results    Diagnosis Colon, segmental resection for tumor, Sigmoid - INVASIVE COLORECTAL ADENOCARCINOMA, 2.5 CM. - CARCINOMA INVADES MUSCULARIS PROPRIA. - MARGINS NOT INVOLVED. - METASTATIC CARCINOMA IN TWO OF EIGHTEEN LYMPH NODES (2/18) - PROXIMAL AND DISTAL ANASTOMOTIC RINGS FREE OF TUMOR.  Specimen: Sigmoid colon with proximal and distal  anastomotic rings. Procedure: Resection. Tumor site: Sigmoid colon. Specimen integrity: Intact. Macroscopic intactness of mesorectum: N/A Macroscopic tumor perforation: No. Invasive tumor: Maximum size: 2.5 cm. Histologic type(s): Colorectal adenocarcinoma. Histologic grade and differentiation: G3 G1: well differentiated/low grade G2: moderately differentiated/low grade G3: poorly differentiated/high grade G4: undifferentiated/high grade Type of polyp in which invasive carcinoma arose: No residual polyp. Microscopic extension of invasive tumor: Into muscularis propria. Lymph-Vascular invasion: Present. Peri-neural invasion: Present. Tumor deposit(s) (discontinuous extramural extension): No.  Resection margins: Proximal margin: Free of tumor. Distal margin: Free of tumor. Circumferential (radial) (posterior ascending, posterior descending; lateral and posterior mid-rectum; and entire lower 1/3 rectum): N/A Mesenteric margin (sigmoid and transverse): Free of tumor. Distance closest margin (if all above margins negative): 6 cm from mesenteric margin. Treatment effect (neo-adjuvant therapy): No. Additional polyp(s): No. Non-neoplastic findings: N/A Lymph nodes: number examined - 18; number positive: 2 Pathologic Staging: pT2, pN1b, pMX Ancillary studies: MSI by PCR and MMR by IHC. (JDP:gt, 04/11/17)    04/07/2017 Surgery    Laparoscopic sigmoid colectomy per Dr. Barry Dienes    04/19/2017 Imaging    CT chest, abdomen and pelvis with contrast showed no definitive evidence of metastasis, numerous indeterminate subcentimeter hypodense lesions scattered throughout the liver, likely benign.    04/27/2017 Tumor Marker    CEA Pre-op 04/04/17: 1.4 Post-op 04/27/17: 1.27    05/09/2017 Imaging    MRI Abdomen IMPRESSION: 1. No evidence of metastatic colon cancer in the abdomen. 2. Multiple simple hepatic cysts. 3. Known cholelithiasis, not well visualized. No biliary dilatation.      05/12/2017 -  11/2017 Chemotherapy    CAPOX q3weeks with Xeloda 1070m BID, 2 weeks on 1 week off. Starting 05/12/17. Xeloda held on 05/19/17 due to poor toleration. Proceed with single agent Xeloda 10032mBID starting with Cycle 2 on 06/04/17. Started cycle 3 Xeloda 1500 mg BID x14 days/7 days off on 06/25/17. Cycle 7 held on 09/16/17 due to Grade 3 hand foot syndrome. Reduced dose with Cycle 7 Xeloda 1500 mg in the morning and 1000 mg in the evening on 09/30/17.     05/19/2017 Tumor Marker    CEA: 1.35    06/23/2017 Tumor Marker    CEA: 1.31    12/21/2017 Imaging    CT AP W Contrast 12/21/17  IMPRESSION: 1. Surgical changes from a sigmoid colon resection without evidence of recurrent tumor, locoregional adenopathy or metastatic disease. 2. Stable numerous hepatic cysts. 3. Cholelithiasis. 4. Probable healed/healing mid sacral stress fracture.    06/30/2018 Imaging    CT CAP W Contrast 06/30/18  IMPRESSION: 1. No acute findings. No evidence of recurrent or metastatic carcinoma within the chest, abdomen, or pelvis. 2. Cholelithiasis. No radiographic evidence of cholecystitis.       CURRENT THERAPY:  Surveillance   INTERVAL HISTORY:  Elizabeth Durham here for a follow up of Sigmoid Cancer. She presents to the clinic today by herself. She notes she is dong well. She denies any pain with normal BMs. She denies any concerns. She also denies neuropathy. She has been able to remain active.      REVIEW OF SYSTEMS:   Constitutional: Denies fevers, chills or abnormal weight loss Eyes: Denies blurriness of vision Ears, nose, mouth, throat, and face: Denies mucositis or sore throat Respiratory: Denies cough, dyspnea or wheezes Cardiovascular: Denies palpitation, chest discomfort or lower extremity swelling Gastrointestinal:  Denies nausea, heartburn or change in bowel habits Skin: Denies abnormal skin rashes Lymphatics: Denies new lymphadenopathy or easy bruising Neurological:Denies  numbness, tingling or new weaknesses Behavioral/Psych: Mood is stable, no new changes  All other systems were reviewed with the patient and are negative.  MEDICAL HISTORY:  Past Medical History:  Diagnosis Date  . Essential hypertension   . GERD (gastroesophageal reflux disease)   . GI bleed   . High cholesterol   . Iron deficiency anemia     SURGICAL HISTORY: Past Surgical History:  Procedure Laterality Date  . COLONOSCOPY    . LAPAROSCOPIC SIGMOID COLECTOMY N/A 04/07/2017   Procedure: LAPAROSCOPIC SIGMOID COLECTOMY, ERAS PATHWAY, splenic flexure takedown;  Surgeon: ByStark KleinMD;  [facility] [facility] Service: General;  Laterality: N/A;  . TONSILLECTOMY    . TOTAL HIP ARTHROPLASTY Right 2012    I have reviewed the social history and family history with the patient and they are unchanged from previous note.  ALLERGIES:  has No Known Allergies.  MEDICATIONS:  Current Outpatient Medications  Medication Sig Dispense Refill  . alendronate (FOSAMAX) 70 MG tablet Take 70 mg by mouth every 7 (seven) days.  11  . amLODipine (NORVASC) 10 MG tablet Take 10 mg by mouth daily. for blood pressure  1  . atorvastatin (LIPITOR) 10 MG tablet Take 10 mg by mouth daily.  3  . pantoprazole (PROTONIX) 40 MG tablet Take 1 tablet (40 mg total) by mouth daily. 30 tablet 0   No current facility-administered medications for this visit.     PHYSICAL EXAMINATION: ECOG PERFORMANCE STATUS: 0 - Asymptomatic  Vitals:   07/03/18 1014  BP: 139/85  Pulse: 69  Resp: 20  Temp: 98 F (36.7 C)  SpO2: 97%   Filed Weights   07/03/18 1014  Weight: 109 lb (49.4 kg)    GENERAL:alert, no distress and comfortable SKIN: skin color, texture, turgor are normal, no rashes or significant lesions EYES: normal, Conjunctiva are pink and non-injected, sclera clear OROPHARYNX:no exudate, no erythema and lips, buccal mucosa, and tongue normal  NECK: supple, thyroid normal size, non-tender, without  nodularity LYMPH:  no palpable lymphadenopathy in the cervical, axillary or inguinal LUNGS: clear to auscultation and percussion with normal breathing effort HEART: regular rate & rhythm and no murmurs and no lower extremity edema ABDOMEN:abdomen soft, non-tender and normal bowel sounds Musculoskeletal:no cyanosis of digits and no clubbing  NEURO: alert & oriented x 3 with fluent speech, no focal motor/sensory deficits  LABORATORY DATA:  I have reviewed the data as listed CBC Latest Ref Rng & Units 06/30/2018 04/03/2018 12/19/2017  WBC 4.0 - 10.5 K/uL 4.1 4.4 5.6  Hemoglobin 12.0 - 15.0 g/dL 13.2 13.2 11.8  Hematocrit 36.0 - 46.0 % 40.3 39.3 35.0  Platelets 150 - 400 K/uL 204 175 175     CMP Latest Ref Rng & Units 06/30/2018 04/03/2018 12/19/2017  Glucose 70 - 99 mg/dL 107(H) 99 106  BUN 8 - 23 mg/dL _0 Creatinine 0.44 - 1.00 mg/dL 1.03(H) 0.98 1.01  Sodium 135 - 145 mmol/L 141 142 139  Potassium 3.5 - 5.1 mmol/L 3.5 3.5 3.7  Chloride 98 - 111 mmol/L 105 106 105  CO2 22 - 32 mmol/L _1 Calcium 8.9 - 10.3 mg/dL 9.7 9.6 9.9  Total Protein 6.5 - 8.1 g/dL 7.9 7.8 7.3  Total Bilirubin 0.3 - 1.2 mg/dL 1.1 0.8 0.6  Alkaline Phos 38 - 126 U/L 64 60 88  AST 15 - 41 U/L 21 20 107(H)  ALT 0 - 44 U/L 16 15 65(H)      RADIOGRAPHIC STUDIES: I have personally reviewed the radiological images as listed and agreed with the findings in the report. No results found.   ASSESSMENT & PLAN:  Elizabeth Durham is a 77 y.o. female with   1. Cancer of Sigmoid Colon, invasive adenocarcinoma, grade 3, pT2 pN1b, cM0, stage IIIA -She was diagnosed in 04/2017. She is s/p sigmoid colectomy and adjuvant CAPOX. She is currently on colon cancer surveillance.  -I reviewed the images myself and discussed her CT CAP from 06/30/18 which shows no evidence of recurrence or metastatic disease. She does have incidental finding of tiny gallstones. She is asymptomatic.   Will monitor.  -She is clinically  doing well. Labs reviewed, CEA, CBC and CMP are within normal limits, except glucose at 107 and Cr at 1.03. No clinical concerns for recurrence  -Will continue with surveillance.  -F/u in 4 months   2. Anemia, iron deficiency  -She has been iron deficient in the past, on oral ferrous sulfate 1 tab daily -She received IV Feraheme on 05/12/18 for iron deficiency anemia and counts recovered. Has not needed more IV Feraheme since.  -HG is 13.2 , anemia resolved -she has stopped oral iron    3. Transaminitis -She previously  developed slightly elevated liver enzymes, possible related to Xeloda, total bilirubin normal, she is asymptomatic. -Resolved now     PLAN: -We reviewed Scan and lab. She is clinically doing well, continue surveillance  -Lab and f/u in 4 months   No problem-specific Assessment & Plan notes found for this encounter.   No orders of the defined  types were placed in this encounter.  All questions were answered. The patient knows to call the clinic with any problems, questions or concerns. No barriers to learning was detected. I spent 20 minutes counseling the patient face to face. The total time spent in the appointment was 25 minutes and more than 50% was on counseling and review of test results     Truitt Merle, MD 07/03/2018   I, Joslyn Devon, am acting as scribe for Truitt Merle, MD.   I have reviewed the above documentation for accuracy and completeness, and I agree with the above.

## 2018-07-03 ENCOUNTER — Telehealth: Payer: Self-pay | Admitting: Hematology

## 2018-07-03 ENCOUNTER — Inpatient Hospital Stay (HOSPITAL_BASED_OUTPATIENT_CLINIC_OR_DEPARTMENT_OTHER): Payer: Medicare HMO | Admitting: Hematology

## 2018-07-03 VITALS — BP 139/85 | HR 69 | Temp 98.0°F | Resp 20 | Ht 64.0 in | Wt 109.0 lb

## 2018-07-03 DIAGNOSIS — I10 Essential (primary) hypertension: Secondary | ICD-10-CM | POA: Diagnosis not present

## 2018-07-03 DIAGNOSIS — C187 Malignant neoplasm of sigmoid colon: Secondary | ICD-10-CM | POA: Diagnosis not present

## 2018-07-03 DIAGNOSIS — Z79899 Other long term (current) drug therapy: Secondary | ICD-10-CM

## 2018-07-03 DIAGNOSIS — Z9221 Personal history of antineoplastic chemotherapy: Secondary | ICD-10-CM | POA: Diagnosis not present

## 2018-07-03 DIAGNOSIS — D509 Iron deficiency anemia, unspecified: Secondary | ICD-10-CM

## 2018-07-03 NOTE — Telephone Encounter (Signed)
Printed calendar and avs. °

## 2018-07-04 ENCOUNTER — Encounter: Payer: Self-pay | Admitting: Hematology

## 2018-08-14 ENCOUNTER — Telehealth: Payer: Self-pay

## 2018-08-14 NOTE — Telephone Encounter (Signed)
Jan with [facility] called wanting to know when patient should have next colonoscopy, called her back and told her she is overdue.

## 2018-10-18 ENCOUNTER — Telehealth: Payer: Self-pay | Admitting: Hematology

## 2018-10-18 NOTE — Telephone Encounter (Signed)
Changed 4/29 appt to telephone visit per sch msg. Called and spoke with patient. Confirmed patient does not need to come to facility

## 2018-10-30 NOTE — Progress Notes (Signed)
[place]   [phone] [phone]) [phone]   Clinic Follow up Note   Patient Care Team: Cyndi Bender, PA-C as PCP - General (Physician Assistant) [name], MD as Consulting Physician (General Surgery) Truitt Merle, MD as Consulting Physician (Hematology) Alla Feeling, NP as Nurse Practitioner (Nurse Practitioner)   I connected with [name] on 11/01/2018 at 11:15 AM EDT by telephone visit and verified that I am speaking with the correct person using two identifiers.  I discussed the limitations, risks, security and privacy concerns of performing an evaluation and management service by telephone and the availability of in person appointments. I also discussed with the patient that there may be a patient responsible charge related to this service. The patient expressed understanding and agreed to proceed.   Other persons participating in the visit and their role in the encounter:  Her daughter   Patient's location:  Her home  Provider's location:  My Office   CHIEF COMPLAINT: F/u of Sigmoid Cancer  SUMMARY OF ONCOLOGIC HISTORY: Oncology History   Cancer Staging Cancer of sigmoid colon [facility]) Staging form: Colon and Rectum, AJCC 8th Edition - Pathologic stage from 04/07/2017: Stage IIIA (pT2, pN1b, cM0) - Signed by Truitt Merle, MD on 04/26/2017       Cancer of sigmoid colon ([place])   03/22/2017 Procedure    Colonoscopy per Dr. Marian Sorrow A rectal examination was performed and was normal. The endoscope was then inserted into the rectum and advanced under direct visualization to about 25 cm where a large, near obstructing mass was noted. This had a malignant appearance. It was biopsied. That area could not be traversed with the adult colonoscope. Small amount of bleeding was noted from the biopsy site, but this stopped right away. 7 mm pedunculated polyp was noted at 15 cm. This was removed using hot snare and retrieved for pathology. Ink was injected into  the submucosa at 20 cm (5 cm distal to the mass). The endoscope was then withdrawn and the procedure terminated.   Findings / Comments: - Mass at 25 cm. Biopsied. - Polyp removed as above.     04/07/2017 Initial Diagnosis    Cancer of sigmoid colon ([place])    04/07/2017 Pathology Results    Diagnosis Colon, segmental resection for tumor, Sigmoid - INVASIVE COLORECTAL ADENOCARCINOMA, 2.5 CM. - CARCINOMA INVADES MUSCULARIS PROPRIA. - MARGINS NOT INVOLVED. - METASTATIC CARCINOMA IN TWO OF EIGHTEEN LYMPH NODES (2/18) - PROXIMAL AND DISTAL ANASTOMOTIC RINGS FREE OF TUMOR.  Specimen: Sigmoid colon with proximal and distal anastomotic rings. Procedure: Resection. Tumor site: Sigmoid colon. Specimen integrity: Intact. Macroscopic intactness of mesorectum: N/A Macroscopic tumor perforation: No. Invasive tumor: Maximum size: 2.5 cm. Histologic type(s): Colorectal adenocarcinoma. Histologic grade and differentiation: G3 G1: well differentiated/low grade G2: moderately differentiated/low grade G3: poorly differentiated/high grade G4: undifferentiated/high grade Type of polyp in which invasive carcinoma arose: No residual polyp. Microscopic extension of invasive tumor: Into muscularis propria. Lymph-Vascular invasion: Present. Peri-neural invasion: Present. Tumor deposit(s) (discontinuous extramural extension): No.  Resection margins: Proximal margin: Free of tumor. Distal margin: Free of tumor. Circumferential (radial) (posterior ascending, posterior descending; lateral and posterior mid-rectum; and entire lower 1/3 rectum): N/A Mesenteric margin (sigmoid and transverse): Free of tumor. Distance closest margin (if all above margins negative): 6 cm from mesenteric margin. Treatment effect (neo-adjuvant therapy): No. Additional polyp(s): No. Non-neoplastic findings: N/A Lymph nodes: number examined - 18; number positive: 2 Pathologic Staging: pT2, pN1b, pMX Ancillary studies: MSI by  PCR and  MMR by IHC. (JDP:gt, 04/11/17)    04/07/2017 Surgery    Laparoscopic sigmoid colectomy per Dr. Barry Dienes    04/19/2017 Imaging    CT chest, abdomen and pelvis with contrast showed no definitive evidence of metastasis, numerous indeterminate subcentimeter hypodense lesions scattered throughout the liver, likely benign.    04/27/2017 Tumor Marker    CEA Pre-op 04/04/17: 1.4 Post-op 04/27/17: 1.27    05/09/2017 Imaging    MRI Abdomen IMPRESSION: 1. No evidence of metastatic colon cancer in the abdomen. 2. Multiple simple hepatic cysts. 3. Known cholelithiasis, not well visualized. No biliary dilatation.     05/12/2017 - 11/2017 Chemotherapy    CAPOX q3weeks with Xeloda 1048m BID, 2 weeks on 1 week off. Starting 05/12/17. Xeloda held on 05/19/17 due to poor toleration. Proceed with single agent Xeloda 100430mBID starting with Cycle 2 on 06/04/17. Started cycle 3 Xeloda 1500 mg BID x14 days/7 days off on 06/25/17. Cycle 7 held on 09/16/17 due to Grade 3 hand foot syndrome. Reduced dose with Cycle 7 Xeloda 1500 mg in the morning and 1000 mg in the evening on 09/30/17.     05/19/2017 Tumor Marker    CEA: 1.35    06/23/2017 Tumor Marker    CEA: 1.31    12/21/2017 Imaging    CT AP W Contrast 12/21/17  IMPRESSION: 1. Surgical changes from a sigmoid colon resection without evidence of recurrent tumor, locoregional adenopathy or metastatic disease. 2. Stable numerous hepatic cysts. 3. Cholelithiasis. 4. Probable healed/healing mid sacral stress fracture.    06/30/2018 Imaging    CT CAP W Contrast 06/30/18  IMPRESSION: 1. No acute findings. No evidence of recurrent or metastatic carcinoma within the chest, abdomen, or pelvis. 2. Cholelithiasis. No radiographic evidence of cholecystitis.     09/20/2018 Procedure    Her 09/20/18 Colonscopy by Dr. BiCoralee Rudhowed a 30m2molyp was notes at 18cm and removed, benign.      CURRENT THERAPY:  Surveillance   INTERVAL HISTORY:  Elizabeth Durham  here for a follow up of sigmoid cancer. She was able to identify herself by birth date. She notes she is doing well. She notes no new changes since last visit. She notes she has been able to be active outside with yardwork. Her energy level is adequate. She denies nausea or abdominal issues, pain. She notes her weight level fluctuates. No significant weight loss. She denies having any concerns. She continues to see her PCP regularly. She notes she has had a colonoscopy in 2-09/2018, it was clear.    REVIEW OF SYSTEMS:   Constitutional: Denies fevers, chills or abnormal weight loss Eyes: Denies blurriness of vision Ears, nose, mouth, throat, and face: Denies mucositis or sore throat Respiratory: Denies cough, dyspnea or wheezes Cardiovascular: Denies palpitation, chest discomfort or lower extremity swelling Gastrointestinal:  Denies nausea, heartburn or change in bowel habits Skin: Denies abnormal skin rashes Lymphatics: Denies new lymphadenopathy or easy bruising Neurological:Denies numbness, tingling or new weaknesses Behavioral/Psych: Mood is stable, no new changes  All other systems were reviewed with the patient and are negative.  MEDICAL HISTORY:  Past Medical History:  Diagnosis Date  . Essential hypertension   . GERD (gastroesophageal reflux disease)   . GI bleed   . High cholesterol   . Iron deficiency anemia     SURGICAL HISTORY: Past Surgical History:  Procedure Laterality Date  . COLONOSCOPY    . LAPAROSCOPIC SIGMOID COLECTOMY N/A 04/07/2017   Procedure: LAPAROSCOPIC SIGMOID COLECTOMY, ERAS PATHWAY, splenic  flexure takedown;  Surgeon: [name], MD;  Location: [place];  Service: General;  Laterality: N/A;  . TONSILLECTOMY    . TOTAL HIP ARTHROPLASTY Right 2012    I have reviewed the social history and family history with the patient and they are unchanged from previous note.  ALLERGIES:  has No Known Allergies.  MEDICATIONS:  Current Outpatient Medications   Medication Sig Dispense Refill  . alendronate (FOSAMAX) 70 MG tablet Take 70 mg by mouth every 7 (seven) days.  11  . amLODipine (NORVASC) 10 MG tablet Take 10 mg by mouth daily. for blood pressure  1  . atorvastatin (LIPITOR) 10 MG tablet Take 10 mg by mouth daily.  3  . pantoprazole (PROTONIX) 40 MG tablet Take 1 tablet (40 mg total) by mouth daily. 30 tablet 0   No current facility-administered medications for this visit.     PHYSICAL EXAMINATION: ECOG PERFORMANCE STATUS: 0 - Asymptomatic  No vitals taken today, Exam not performed today  LABORATORY DATA:  I have reviewed the data as listed CBC Latest Ref Rng & Units 06/30/2018 04/03/2018 12/19/2017  WBC 4.0 - 10.5 K/uL 4.1 4.4 5.6  Hemoglobin 12.0 - 15.0 g/dL 13.2 13.2 11.8  Hematocrit 36.0 - 46.0 % 40.3 39.3 35.0  Platelets 150 - 400 K/uL 204 175 175     CMP Latest Ref Rng & Units 06/30/2018 04/03/2018 12/19/2017  Glucose 70 - 99 mg/dL 107(H) 99 106  BUN 8 - 23 mg/dL '22 14 21  ' Creatinine 0.44 - 1.00 mg/dL 1.03(H) 0.98 1.01  Sodium 135 - 145 mmol/L 141 142 139  Potassium 3.5 - 5.1 mmol/L 3.5 3.5 3.7  Chloride 98 - 111 mmol/L 105 106 105  CO2 22 - 32 mmol/L '26 25 26  ' Calcium 8.9 - 10.3 mg/dL 9.7 9.6 9.9  Total Protein 6.5 - 8.1 g/dL 7.9 7.8 7.3  Total Bilirubin 0.3 - 1.2 mg/dL 1.1 0.8 0.6  Alkaline Phos 38 - 126 U/L 64 60 88  AST 15 - 41 U/L 21 20 107(H)  ALT 0 - 44 U/L 16 15 65(H)      RADIOGRAPHIC STUDIES: I have personally reviewed the radiological images as listed and agreed with the findings in the report. No results found.   ASSESSMENT & PLAN:  SAMIAH RICKLEFS is a 78 y.o. female with   1. Cancer of Sigmoid Colon, invasive adenocarcinoma, grade 3, pT2 pN1b, cM0, stage IIIA -She was diagnosed in 04/2017. She is s/p sigmoid colectomy and adjuvant CAPOX. She is currently on colon cancer surveillance.  -Her 09/20/18 Colonscopy showed a 39m polyp was notes at 18cm and removed, benign.  -She is clinically doing well  and stable. No clinical concerns for recurrence -Will continue with surveillance, will scan her again in Dec 2020  -F/u in 4 months      PLAN: -She is clinically doing well and stable  -Lab, f/u in 4 months   No problem-specific Assessment & Plan notes found for this encounter.   No orders of the defined types were placed in this encounter.  I discussed the assessment and treatment plan with the patient. The patient was provided an opportunity to ask questions and all were answered. The patient agreed with the plan and demonstrated an understanding of the instructions.  The patient was advised to call back or seek an in-person evaluation if the symptoms worsen or if the condition fails to improve as anticipated.  I provided 10 minutes of non face-to-face telephone visit time  during this encounter, and > 50% was spent counseling as documented under my assessment & plan.    Truitt Merle, MD 11/01/2018   I, Joslyn Devon, am acting as scribe for Truitt Merle, MD.   I have reviewed the above documentation for accuracy and completeness, and I agree with the above.

## 2018-11-01 ENCOUNTER — Other Ambulatory Visit: Payer: Medicare HMO

## 2018-11-01 ENCOUNTER — Telehealth: Payer: Self-pay | Admitting: Hematology

## 2018-11-01 ENCOUNTER — Encounter: Payer: Self-pay | Admitting: Hematology

## 2018-11-01 ENCOUNTER — Inpatient Hospital Stay: Payer: Medicare HMO | Attending: Hematology | Admitting: Hematology

## 2018-11-01 DIAGNOSIS — C187 Malignant neoplasm of sigmoid colon: Secondary | ICD-10-CM | POA: Diagnosis not present

## 2018-11-01 NOTE — Telephone Encounter (Signed)
Scheduled appt per 4/29 los. ° °A calendar will be mailed out. °

## 2019-03-01 NOTE — Progress Notes (Signed)
[place]   [phone] [phone]) [phone]   Clinic Follow up Note   Patient Care Team: Cyndi Bender, PA-C as PCP - General (Physician Assistant) [name], MD as Consulting Physician (General Surgery) Truitt Merle, MD as Consulting Physician (Hematology) Alla Feeling, NP as Nurse Practitioner (Nurse Practitioner)  Date of Service:  03/02/2019  CHIEF COMPLAINT: F/u of Sigmoid Cancer  SUMMARY OF ONCOLOGIC HISTORY: Oncology History Overview Note  Cancer Staging Cancer of sigmoid colon [facility]) Staging form: Colon and Rectum, AJCC 8th Edition - Pathologic stage from 04/07/2017: Stage IIIA (pT2, pN1b, cM0) - Signed by Truitt Merle, MD on 04/26/2017     Cancer of sigmoid colon ([place])  03/22/2017 Procedure   Colonoscopy per Dr. Marian Sorrow A rectal examination was performed and was normal. The endoscope was then inserted into the rectum and advanced under direct visualization to about 25 cm where a large, near obstructing mass was noted. This had a malignant appearance. It was biopsied. That area could not be traversed with the adult colonoscope. Small amount of bleeding was noted from the biopsy site, but this stopped right away. 7 mm pedunculated polyp was noted at 15 cm. This was removed using hot snare and retrieved for pathology. Ink was injected into the submucosa at 20 cm (5 cm distal to the mass). The endoscope was then withdrawn and the procedure terminated.   Findings / Comments: - Mass at 25 cm. Biopsied. - Polyp removed as above.    04/07/2017 Initial Diagnosis   Cancer of sigmoid colon ([place])   04/07/2017 Pathology Results   Diagnosis Colon, segmental resection for tumor, Sigmoid - INVASIVE COLORECTAL ADENOCARCINOMA, 2.5 CM. - CARCINOMA INVADES MUSCULARIS PROPRIA. - MARGINS NOT INVOLVED. - METASTATIC CARCINOMA IN TWO OF EIGHTEEN LYMPH NODES (2/18) - PROXIMAL AND DISTAL ANASTOMOTIC RINGS FREE OF TUMOR.  Specimen: Sigmoid colon with proximal and  distal anastomotic rings. Procedure: Resection. Tumor site: Sigmoid colon. Specimen integrity: Intact. Macroscopic intactness of mesorectum: N/A Macroscopic tumor perforation: No. Invasive tumor: Maximum size: 2.5 cm. Histologic type(s): Colorectal adenocarcinoma. Histologic grade and differentiation: G3 G1: well differentiated/low grade G2: moderately differentiated/low grade G3: poorly differentiated/high grade G4: undifferentiated/high grade Type of polyp in which invasive carcinoma arose: No residual polyp. Microscopic extension of invasive tumor: Into muscularis propria. Lymph-Vascular invasion: Present. Peri-neural invasion: Present. Tumor deposit(s) (discontinuous extramural extension): No.  Resection margins: Proximal margin: Free of tumor. Distal margin: Free of tumor. Circumferential (radial) (posterior ascending, posterior descending; lateral and posterior mid-rectum; and entire lower 1/3 rectum): N/A Mesenteric margin (sigmoid and transverse): Free of tumor. Distance closest margin (if all above margins negative): 6 cm from mesenteric margin. Treatment effect (neo-adjuvant therapy): No. Additional polyp(s): No. Non-neoplastic findings: N/A Lymph nodes: number examined - 18; number positive: 2 Pathologic Staging: pT2, pN1b, pMX Ancillary studies: MSI by PCR and MMR by IHC. (JDP:gt, 04/11/17)   04/07/2017 Surgery   Laparoscopic sigmoid colectomy per Dr. Barry Dienes   04/19/2017 Imaging   CT chest, abdomen and pelvis with contrast showed no definitive evidence of metastasis, numerous indeterminate subcentimeter hypodense lesions scattered throughout the liver, likely benign.   04/27/2017 Tumor Marker   CEA Pre-op 04/04/17: 1.4 Post-op 04/27/17: 1.27   05/09/2017 Imaging   MRI Abdomen IMPRESSION: 1. No evidence of metastatic colon cancer in the abdomen. 2. Multiple simple hepatic cysts. 3. Known cholelithiasis, not well visualized. No biliary dilatation.     05/12/2017 - 11/2017 Chemotherapy   CAPOX q3weeks with Xeloda 1068m BID, 2 weeks on 1 week  off. Starting 05/12/17. Xeloda held on 05/19/17 due to poor toleration. Proceed with single agent Xeloda 1043m BID starting with Cycle 2 on 06/04/17. Started cycle 3 Xeloda 1500 mg BID x14 days/7 days off on 06/25/17. Cycle 7 held on 09/16/17 due to Grade 3 hand foot syndrome. Reduced dose with Cycle 7 Xeloda 1500 mg in the morning and 1000 mg in the evening on 09/30/17.    05/19/2017 Tumor Marker   CEA: 1.35   06/23/2017 Tumor Marker   CEA: 1.31   12/21/2017 Imaging   CT AP W Contrast 12/21/17  IMPRESSION: 1. Surgical changes from a sigmoid colon resection without evidence of recurrent tumor, locoregional adenopathy or metastatic disease. 2. Stable numerous hepatic cysts. 3. Cholelithiasis. 4. Probable healed/healing mid sacral stress fracture.   06/30/2018 Imaging   CT CAP W Contrast 06/30/18  IMPRESSION: 1. No acute findings. No evidence of recurrent or metastatic carcinoma within the chest, abdomen, or pelvis. 2. Cholelithiasis. No radiographic evidence of cholecystitis.    09/20/2018 Procedure   Her 09/20/18 Colonscopy by Dr. BCoralee Rudshowed a 789mpolyp was notes at 18cm and removed, benign.       CURRENT THERAPY:  Surveillance  INTERVAL HISTORY:  Elizabeth CAJASs here for a follow up of sigmoid cancer. She presents to the clinic alone. She notes she is doing well. She notes she stopped Propranolol. She has been very active in her yard. She denies stomach pain or nausea. She denies change in her urine color.   REVIEW OF SYSTEMS:   Constitutional: Denies fevers, chills or abnormal weight loss Eyes: Denies blurriness of vision Ears, nose, mouth, throat, and face: Denies mucositis or sore throat Respiratory: Denies cough, dyspnea or wheezes Cardiovascular: Denies palpitation, chest discomfort or lower extremity swelling Gastrointestinal:  Denies nausea, heartburn or change in bowel habits  Skin: Denies abnormal skin rashes Lymphatics: Denies new lymphadenopathy or easy bruising Neurological:Denies numbness, tingling or new weaknesses Behavioral/Psych: Mood is stable, no new changes  All other systems were reviewed with the patient and are negative.  MEDICAL HISTORY:  Past Medical History:  Diagnosis Date  . Essential hypertension   . GERD (gastroesophageal reflux disease)   . GI bleed   . High cholesterol   . Iron deficiency anemia     SURGICAL HISTORY: Past Surgical History:  Procedure Laterality Date  . COLONOSCOPY    . LAPAROSCOPIC SIGMOID COLECTOMY N/A 04/07/2017   Procedure: LAPAROSCOPIC SIGMOID COLECTOMY, ERAS PATHWAY, splenic flexure takedown;  Surgeon: ByStark KleinMD;  [facility] Service: General;  Laterality: N/A;  . TONSILLECTOMY    . TOTAL HIP ARTHROPLASTY Right 2012    I have reviewed the social history and family history with the patient and they are unchanged from previous note.  ALLERGIES:  has No Known Allergies.  MEDICATIONS:  Current Outpatient Medications  Medication Sig Dispense Refill  . alendronate (FOSAMAX) 70 MG tablet Take 70 mg by mouth every 7 (seven) days.  11  . amLODipine (NORVASC) 10 MG tablet Take 10 mg by mouth daily. for blood pressure  1  . atorvastatin (LIPITOR) 10 MG tablet Take 10 mg by mouth daily.  3   No current facility-administered medications for this visit.     PHYSICAL EXAMINATION: ECOG PERFORMANCE STATUS: 0 - Asymptomatic  Vitals:   03/02/19 1118  BP: 130/75  Pulse: (!) 59  Resp: 18  Temp: 98.3 F (36.8 C)  SpO2: 99%   Filed Weights   03/02/19 1118  Weight: 108 lb  4.8 oz (49.1 kg)    GENERAL:alert, no distress and comfortable SKIN: skin color, texture, turgor are normal, no rashes or significant lesions EYES: normal, Conjunctiva are pink and non-injected, sclera clear  NECK: supple, thyroid normal size, non-tender, without nodularity (+) fullness of neck LYMPH:  no palpable  lymphadenopathy in the cervical, axillary  LUNGS: clear to auscultation and percussion with normal breathing effort HEART: regular rate & rhythm and no murmurs and no lower extremity edema ABDOMEN:abdomen soft, non-tender and normal bowel sounds Musculoskeletal:no cyanosis of digits and no clubbing  NEURO: alert & oriented x 3 with fluent speech, no focal motor/sensory deficits  LABORATORY DATA:  I have reviewed the data as listed CBC Latest Ref Rng & Units 03/02/2019 06/30/2018 04/03/2018  WBC 4.0 - 10.5 K/uL 4.9 4.1 4.4  Hemoglobin 12.0 - 15.0 g/dL 13.1 13.2 13.2  Hematocrit 36.0 - 46.0 % 39.1 40.3 39.3  Platelets 150 - 400 K/uL 179 204 175     CMP Latest Ref Rng & Units 03/02/2019 06/30/2018 04/03/2018  Glucose 70 - 99 mg/dL 107(H) 107(H) 99  BUN 8 - 23 mg/dL _0 Creatinine 0.44 - 1.00 mg/dL 1.07(H) 1.03(H) 0.98  Sodium 135 - 145 mmol/L 141 141 142  Potassium 3.5 - 5.1 mmol/L 3.5 3.5 3.5  Chloride 98 - 111 mmol/L 105 105 106  CO2 22 - 32 mmol/L _1 Calcium 8.9 - 10.3 mg/dL 9.5 9.7 9.6  Total Protein 6.5 - 8.1 g/dL 7.9 7.9 7.8  Total Bilirubin 0.3 - 1.2 mg/dL 1.4(H) 1.1 0.8  Alkaline Phos 38 - 126 U/L 60 64 60  AST 15 - 41 U/L _2 ALT 0 - 44 U/L _3 RADIOGRAPHIC STUDIES: I have personally reviewed the radiological images as listed and agreed with the findings in the report. No results found.   ASSESSMENT & PLAN:  ANYAE GRIFFITH is a 78 y.o. female with   1. Cancer of Sigmoid Colon, invasive adenocarcinoma, grade 3, pT2 pN1b, cM0, stage IIIA -She was diagnosed in 04/2017. She is s/p sigmoid colectomy and adjuvant CAPOX. She is currently on colon cancer surveillance. -Her 09/20/18 Colonoscopy showed a 11m polyp was notes at 18cm and removed, benign.  -Last surveillance CT scan in December 2019 was negative -She is clinically doing well. Physical exam unremarkable. Labs reviewed, CBC and CMP WNL except BG 107, Cr 1.07, Tbili 1.4. CEA still pending.   -Will monitor her Tbili, she is currently asymptomatic. I encouraged her to watch for jaundice and dark urine color. If present she should contact clinic right away.  -Will continue with surveillance, will scan her again in Dec 2020  -F/u in 4 monthswith surveillance CT scan a few days before   PLAN: -She is clinically doing well and stable. Will monitor her Tbili -f/u in 4 months with lab and CT CAP W contrast a few days before    No problem-specific Assessment & Plan notes found for this encounter.   Orders Placed This Encounter  Procedures  . CT Abdomen Pelvis W Contrast    Standing Status:   Future    Standing Expiration Date:   03/01/2020    Order Specific Question:   If indicated for the ordered procedure, I authorize the administration of contrast media per Radiology protocol    Answer:   Yes    Order Specific Question:   Pref[facility]?    [facility]    Order Specific Question:   Is Oral Contrast requested for this exam?    Answer:   Yes, Per Radiology protocol    Order Specific Question:   Radiology Contrast Protocol - do NOT remove file path    Answer:   [email or web address]  . CT Chest W Contrast    Standing Status:   Future    Standing Expiration Date:   03/01/2020    Order Specific Question:   If indicated for the ordered procedure, I authorize the administration of contrast media per Radiology protocol    Answer:   Yes    Order Specific Question:   Preferred imaging location?    Answer:   [facility]    Order Specific Question:   Radiology Contrast Protocol - do NOT remove file path    Answer:   \\[email or web address]   All questions were answered. The patient knows to call the clinic with any problems, questions or concerns. No barriers to learning was detected. I spent 15 minutes counseling the patient face to face. The total time spent in the appointment was 20 minutes and  more than 50% was on counseling and review of test results     Truitt Merle, MD 03/02/2019   I, Joslyn Devon, am acting as scribe for Truitt Merle, MD.   I have reviewed the above documentation for accuracy and completeness, and I agree with the above.

## 2019-03-02 ENCOUNTER — Telehealth: Payer: Self-pay | Admitting: Hematology

## 2019-03-02 ENCOUNTER — Inpatient Hospital Stay (HOSPITAL_BASED_OUTPATIENT_CLINIC_OR_DEPARTMENT_OTHER): Payer: Medicare Other | Admitting: Hematology

## 2019-03-02 ENCOUNTER — Inpatient Hospital Stay: Payer: Medicare Other | Attending: Hematology

## 2019-03-02 ENCOUNTER — Other Ambulatory Visit: Payer: Self-pay

## 2019-03-02 VITALS — BP 130/75 | HR 59 | Temp 98.3°F | Resp 18 | Ht 64.0 in | Wt 108.3 lb

## 2019-03-02 DIAGNOSIS — Z79899 Other long term (current) drug therapy: Secondary | ICD-10-CM | POA: Diagnosis not present

## 2019-03-02 DIAGNOSIS — E7800 Pure hypercholesterolemia, unspecified: Secondary | ICD-10-CM | POA: Diagnosis not present

## 2019-03-02 DIAGNOSIS — Z9221 Personal history of antineoplastic chemotherapy: Secondary | ICD-10-CM | POA: Insufficient documentation

## 2019-03-02 DIAGNOSIS — D509 Iron deficiency anemia, unspecified: Secondary | ICD-10-CM | POA: Diagnosis not present

## 2019-03-02 DIAGNOSIS — Z85038 Personal history of other malignant neoplasm of large intestine: Secondary | ICD-10-CM | POA: Diagnosis present

## 2019-03-02 DIAGNOSIS — C187 Malignant neoplasm of sigmoid colon: Secondary | ICD-10-CM

## 2019-03-02 DIAGNOSIS — Z923 Personal history of irradiation: Secondary | ICD-10-CM | POA: Insufficient documentation

## 2019-03-02 DIAGNOSIS — I10 Essential (primary) hypertension: Secondary | ICD-10-CM | POA: Insufficient documentation

## 2019-03-02 DIAGNOSIS — Z9049 Acquired absence of other specified parts of digestive tract: Secondary | ICD-10-CM | POA: Diagnosis not present

## 2019-03-02 LAB — CMP (CANCER CENTER ONLY)
ALT: 10 U/L (ref 0–44)
AST: 20 U/L (ref 15–41)
Albumin: 4.7 g/dL (ref 3.5–5.0)
Alkaline Phosphatase: 60 U/L (ref 38–126)
Anion gap: 12 (ref 5–15)
BUN: 23 mg/dL (ref 8–23)
CO2: 24 mmol/L (ref 22–32)
Calcium: 9.5 mg/dL (ref 8.9–10.3)
Chloride: 105 mmol/L (ref 98–111)
Creatinine: 1.07 mg/dL — ABNORMAL HIGH (ref 0.44–1.00)
GFR, Est AFR Am: 59 mL/min — ABNORMAL LOW (ref >60)
GFR, Estimated: 51 mL/min — ABNORMAL LOW (ref >60)
Glucose, Bld: 107 mg/dL — ABNORMAL HIGH (ref 70–99)
Potassium: 3.5 mmol/L (ref 3.5–5.1)
Sodium: 141 mmol/L (ref 135–145)
Total Bilirubin: 1.4 mg/dL — ABNORMAL HIGH (ref 0.3–1.2)
Total Protein: 7.9 g/dL (ref 6.5–8.1)

## 2019-03-02 LAB — CBC WITH DIFFERENTIAL (CANCER CENTER ONLY)
Abs Immature Granulocytes: 0.01 10*3/uL (ref 0.00–0.07)
Basophils Absolute: 0 10*3/uL (ref 0.0–0.1)
Basophils Relative: 0 %
Eosinophils Absolute: 0 10*3/uL (ref 0.0–0.5)
Eosinophils Relative: 1 %
HCT: 39.1 % (ref 36.0–46.0)
Hemoglobin: 13.1 g/dL (ref 12.0–15.0)
Immature Granulocytes: 0 %
Lymphocytes Relative: 15 %
Lymphs Abs: 0.8 10*3/uL (ref 0.7–4.0)
MCH: 30.6 pg (ref 26.0–34.0)
MCHC: 33.5 g/dL (ref 30.0–36.0)
MCV: 91.4 fL (ref 80.0–100.0)
Monocytes Absolute: 0.5 10*3/uL (ref 0.1–1.0)
Monocytes Relative: 11 %
Neutro Abs: 3.6 10*3/uL (ref 1.7–7.7)
Neutrophils Relative %: 73 %
Platelet Count: 179 10*3/uL (ref 150–400)
RBC: 4.28 MIL/uL (ref 3.87–5.11)
RDW: 12.3 % (ref 11.5–15.5)
WBC Count: 4.9 10*3/uL (ref 4.0–10.5)
nRBC: 0 % (ref 0.0–0.2)

## 2019-03-02 LAB — CEA (IN HOUSE-CHCC): CEA (CHCC-In House): 1.82 ng/mL (ref 0.00–5.00)

## 2019-03-02 NOTE — Telephone Encounter (Signed)
Scheduled appt per 8/28 los.  Printed calendar and avs.  Gave patient contrast also.

## 2019-03-03 ENCOUNTER — Encounter: Payer: Self-pay | Admitting: Hematology

## 2019-07-02 ENCOUNTER — Other Ambulatory Visit: Payer: Self-pay

## 2019-07-02 ENCOUNTER — Encounter (HOSPITAL_COMMUNITY): Payer: Self-pay

## 2019-07-02 ENCOUNTER — Inpatient Hospital Stay: Payer: Medicare Other | Attending: Hematology

## 2019-07-02 ENCOUNTER — Ambulatory Visit (HOSPITAL_COMMUNITY)
Admission: RE | Admit: 2019-07-02 | Discharge: 2019-07-02 | Disposition: A | Discharge status: Home / Self Care | Payer: Medicare Other | Source: Ambulatory Visit | Attending: Hematology | Admitting: Hematology

## 2019-07-02 DIAGNOSIS — Z9221 Personal history of antineoplastic chemotherapy: Secondary | ICD-10-CM | POA: Diagnosis not present

## 2019-07-02 DIAGNOSIS — E876 Hypokalemia: Secondary | ICD-10-CM | POA: Insufficient documentation

## 2019-07-02 DIAGNOSIS — Z85038 Personal history of other malignant neoplasm of large intestine: Secondary | ICD-10-CM | POA: Insufficient documentation

## 2019-07-02 DIAGNOSIS — C187 Malignant neoplasm of sigmoid colon: Secondary | ICD-10-CM

## 2019-07-02 DIAGNOSIS — I10 Essential (primary) hypertension: Secondary | ICD-10-CM | POA: Diagnosis not present

## 2019-07-02 DIAGNOSIS — Z79899 Other long term (current) drug therapy: Secondary | ICD-10-CM | POA: Diagnosis not present

## 2019-07-02 HISTORY — DX: Malignant (primary) neoplasm, unspecified: C80.1

## 2019-07-02 LAB — CBC WITH DIFFERENTIAL (CANCER CENTER ONLY)
Abs Immature Granulocytes: 0.02 10*3/uL (ref 0.00–0.07)
Basophils Absolute: 0 10*3/uL (ref 0.0–0.1)
Basophils Relative: 0 %
Eosinophils Absolute: 0.1 10*3/uL (ref 0.0–0.5)
Eosinophils Relative: 1 %
HCT: 39.9 % (ref 36.0–46.0)
Hemoglobin: 13.4 g/dL (ref 12.0–15.0)
Immature Granulocytes: 0 %
Lymphocytes Relative: 19 %
Lymphs Abs: 1 10*3/uL (ref 0.7–4.0)
MCH: 30.7 pg (ref 26.0–34.0)
MCHC: 33.6 g/dL (ref 30.0–36.0)
MCV: 91.5 fL (ref 80.0–100.0)
Monocytes Absolute: 0.7 10*3/uL (ref 0.1–1.0)
Monocytes Relative: 13 %
Neutro Abs: 3.5 10*3/uL (ref 1.7–7.7)
Neutrophils Relative %: 67 %
Platelet Count: 185 10*3/uL (ref 150–400)
RBC: 4.36 MIL/uL (ref 3.87–5.11)
RDW: 12.4 % (ref 11.5–15.5)
WBC Count: 5.2 10*3/uL (ref 4.0–10.5)
nRBC: 0 % (ref 0.0–0.2)

## 2019-07-02 LAB — CMP (CANCER CENTER ONLY)
ALT: 67 U/L — ABNORMAL HIGH (ref 0–44)
AST: 29 U/L (ref 15–41)
Albumin: 4.7 g/dL (ref 3.5–5.0)
Alkaline Phosphatase: 77 U/L (ref 38–126)
Anion gap: 13 (ref 5–15)
BUN: 19 mg/dL (ref 8–23)
CO2: 26 mmol/L (ref 22–32)
Calcium: 9.5 mg/dL (ref 8.9–10.3)
Chloride: 103 mmol/L (ref 98–111)
Creatinine: 0.98 mg/dL (ref 0.44–1.00)
GFR, Est AFR Am: >60 mL/min (ref >60)
GFR, Estimated: 56 mL/min — ABNORMAL LOW (ref >60)
Glucose, Bld: 109 mg/dL — ABNORMAL HIGH (ref 70–99)
Potassium: 3.2 mmol/L — ABNORMAL LOW (ref 3.5–5.1)
Sodium: 142 mmol/L (ref 135–145)
Total Bilirubin: 1 mg/dL (ref 0.3–1.2)
Total Protein: 7.8 g/dL (ref 6.5–8.1)

## 2019-07-02 LAB — CEA (IN HOUSE-CHCC): CEA (CHCC-In House): 1.73 ng/mL (ref 0.00–5.00)

## 2019-07-02 MED ORDER — SODIUM CHLORIDE (PF) 0.9 % IJ SOLN
INTRAMUSCULAR | Status: AC
  Filled 2019-07-02: qty 50

## 2019-07-02 MED ORDER — IOHEXOL 300 MG/ML  SOLN
75.0000 mL | Freq: Once | INTRAMUSCULAR | Status: AC | PRN
  Administered 2019-07-02: 15:00:00 75 mL via INTRAVENOUS

## 2019-07-02 NOTE — Progress Notes (Addendum)
Elizabeth Durham   [phone] Fax:([phone]) [phone]   Clinic Follow up Note   Patient Care Team: Cyndi Bender, PA-C as PCP - General (Physician Assistant) [name], MD as Consulting Physician (General Surgery) Truitt Merle, MD as Consulting Physician (Hematology) Alla Feeling, NP as Nurse Practitioner (Nurse Practitioner)  Date of Service:  07/04/2019   I connected with Elizabeth Durham on 07/04/2019 by video enabled telemedicine visit and verified that I am speaking with the correct person using two identifiers.   I discussed the limitations, risks, security and privacy concerns of performing an evaluation and management service by telephone and the availability of in person appointments. I also discussed with the patient that there may be a patient responsible charge related to this service. The patient expressed understanding and agreed to proceed.   Other persons participating in the visit and their role in the encounter:  my scribe Amoya Bernnett    Patient's location: office  Provider's location:  Home  CHIEF COMPLAINT: F/u of Sigmoid Cancer  SUMMARY OF ONCOLOGIC HISTORY: Oncology History Overview Note  Cancer Staging Cancer of sigmoid colon [facility]) Staging form: Colon and Rectum, AJCC 8th Edition - Pathologic stage from 04/07/2017: Stage IIIA (pT2, pN1b, cM0) - Signed by Truitt Merle, MD on 04/26/2017     Cancer of sigmoid colon ([place])  03/22/2017 Procedure   Colonoscopy per Dr. Marian Sorrow A rectal examination was performed and was normal. The endoscope was then inserted into the rectum and advanced under direct visualization to about 25 cm where a large, near obstructing mass was noted. This had a malignant appearance. It was biopsied. That area could not be traversed with the adult colonoscope. Small amount of bleeding was noted from the biopsy site, but this stopped right away. 7 mm pedunculated polyp was noted at 15 cm. This was removed using hot snare  and retrieved for pathology. Ink was injected into the submucosa at 20 cm (5 cm distal to the mass). The endoscope was then withdrawn and the procedure terminated.   Findings / Comments: - Mass at 25 cm. Biopsied. - Polyp removed as above.    04/07/2017 Initial Diagnosis   Cancer of sigmoid colon ([place])   04/07/2017 Pathology Results   Diagnosis Colon, segmental resection for tumor, Sigmoid - INVASIVE COLORECTAL ADENOCARCINOMA, 2.5 CM. - CARCINOMA INVADES MUSCULARIS PROPRIA. - MARGINS NOT INVOLVED. - METASTATIC CARCINOMA IN TWO OF EIGHTEEN LYMPH NODES (2/18) - PROXIMAL AND DISTAL ANASTOMOTIC RINGS FREE OF TUMOR.  Specimen: Sigmoid colon with proximal and distal anastomotic rings. Procedure: Resection. Tumor site: Sigmoid colon. Specimen integrity: Intact. Macroscopic intactness of mesorectum: N/A Macroscopic tumor perforation: No. Invasive tumor: Maximum size: 2.5 cm. Histologic type(s): Colorectal adenocarcinoma. Histologic grade and differentiation: G3 G1: well differentiated/low grade G2: moderately differentiated/low grade G3: poorly differentiated/high grade G4: undifferentiated/high grade Type of polyp in which invasive carcinoma arose: No residual polyp. Microscopic extension of invasive tumor: Into muscularis propria. Lymph-Vascular invasion: Present. Peri-neural invasion: Present. Tumor deposit(s) (discontinuous extramural extension): No.  Resection margins: Proximal margin: Free of tumor. Distal margin: Free of tumor. Circumferential (radial) (posterior ascending, posterior descending; lateral and posterior mid-rectum; and entire lower 1/3 rectum): N/A Mesenteric margin (sigmoid and transverse): Free of tumor. Distance closest margin (if all above margins negative): 6 cm from mesenteric margin. Treatment effect (neo-adjuvant therapy): No. Additional polyp(s): No. Non-neoplastic findings: N/A Lymph nodes: number examined - 18; number positive: 2 Pathologic  Staging: pT2, pN1b, pMX Ancillary studies: MSI by PCR and MMR by IHC. (  JDP:gt, 04/11/17)   04/07/2017 Surgery   Laparoscopic sigmoid colectomy per Dr. Barry Dienes   04/19/2017 Imaging   CT chest, abdomen and pelvis with contrast showed no definitive evidence of metastasis, numerous indeterminate subcentimeter hypodense lesions scattered throughout the liver, likely benign.   04/27/2017 Tumor Marker   CEA Pre-op 04/04/17: 1.4 Post-op 04/27/17: 1.27   05/09/2017 Imaging   MRI Abdomen IMPRESSION: 1. No evidence of metastatic colon cancer in the abdomen. 2. Multiple simple hepatic cysts. 3. Known cholelithiasis, not well visualized. No biliary dilatation.    05/12/2017 - 11/2017 Chemotherapy   CAPOX q3weeks with Xeloda 1034m BID, 2 weeks on 1 week off. Starting 05/12/17. Xeloda held on 05/19/17 due to poor toleration. Proceed with single agent Xeloda 10046mBID starting with Cycle 2 on 06/04/17. Started cycle 3 Xeloda 1500 mg BID x14 days/7 days off on 06/25/17. Cycle 7 held on 09/16/17 due to Grade 3 hand foot syndrome. Reduced dose with Cycle 7 Xeloda 1500 mg in the morning and 1000 mg in the evening on 09/30/17.    05/19/2017 Tumor Marker   CEA: 1.35   06/23/2017 Tumor Marker   CEA: 1.31   12/21/2017 Imaging   CT AP W Contrast 12/21/17  IMPRESSION: 1. Surgical changes from a sigmoid colon resection without evidence of recurrent tumor, locoregional adenopathy or metastatic disease. 2. Stable numerous hepatic cysts. 3. Cholelithiasis. 4. Probable healed/healing mid sacral stress fracture.   06/30/2018 Imaging   CT CAP W Contrast 06/30/18  IMPRESSION: 1. No acute findings. No evidence of recurrent or metastatic carcinoma within the chest, abdomen, or pelvis. 2. Cholelithiasis. No radiographic evidence of cholecystitis.    09/20/2018 Procedure   Her 09/20/18 Colonscopy by Dr. BiCoralee Rudhowed a 41m16molyp was notes at 18cm and removed, benign.    07/02/2019 Imaging   CT CAP W Contrast    IMPRESSION: 1. No findings of recurrent malignancy. 2. Other imaging findings of potential clinical significance: Scattered hepatic cysts. Cholelithiasis. Chronic scarring in the left kidney upper pole with adjacent cyst. Prominent stool throughout the colon favors constipation. Old healed fractures of the manubrium, S3 vertebral body, and right inferior pubic ramus. Chronic appearance of perineural cysts, with potential erosions along the pedicles at L3 and L4.   Aortic Atherosclerosis (ICD10-I70.0).      CURRENT THERAPY:  Surveillance  INTERVAL HISTORY:  BarSHYASIA Durham here for a follow up of sigmoid cancer. She presents to the clinic with her family member. She notes she is doing well. She notes no recent changes. She feels her BMs are stable with occasional constipation. She notes she has history of broken bones before. She notes she has not had DEXA in a while but will check with her PCP in 08/2019 at next visit.     REVIEW OF SYSTEMS:   Constitutional: Denies fevers, chills or abnormal weight loss Eyes: Denies blurriness of vision Ears, nose, mouth, throat, and face: Denies mucositis or sore throat Respiratory: Denies cough, dyspnea or wheezes Cardiovascular: Denies palpitation, chest discomfort or lower extremity swelling Gastrointestinal:  Denies nausea, heartburn (+) Occasional constipation  Skin: Denies abnormal skin rashes Lymphatics: Denies new lymphadenopathy or easy bruising Neurological:Denies numbness, tingling or new weaknesses Behavioral/Psych: Mood is stable, no new changes  All other systems were reviewed with the patient and are negative.  MEDICAL HISTORY:  Past Medical History:  Diagnosis Date  . colon ca dx'd 03/2017  . Essential hypertension   . GERD (gastroesophageal reflux disease)   . GI bleed   .  High cholesterol   . Iron deficiency anemia     SURGICAL HISTORY: Past Surgical History:  Procedure Laterality Date  . COLONOSCOPY    .  LAPAROSCOPIC SIGMOID COLECTOMY N/A 04/07/2017   Procedure: LAPAROSCOPIC SIGMOID COLECTOMY, ERAS PATHWAY, splenic flexure takedown;  Surgeon: [name], MD;  Location: [place];  Service: General;  Laterality: N/A;  . TONSILLECTOMY    . TOTAL HIP ARTHROPLASTY Right 2012    I have reviewed the social history and family history with the patient and they are unchanged from previous note.  ALLERGIES:  has No Known Allergies.  MEDICATIONS:  Current Outpatient Medications  Medication Sig Dispense Refill  . alendronate (FOSAMAX) 70 MG tablet Take 70 mg by mouth every 7 (seven) days.  11  . amLODipine (NORVASC) 10 MG tablet Take 10 mg by mouth daily. for blood pressure  1  . atorvastatin (LIPITOR) 10 MG tablet Take 10 mg by mouth daily.  3   No current facility-administered medications for this visit.    PHYSICAL EXAMINATION: ECOG PERFORMANCE STATUS: 0 - Asymptomatic  Vitals:   07/04/19 1010  BP: 116/73  Pulse: 62  Resp: 16  Temp: 98.2 F (36.8 C)  SpO2: 98%   Filed Weights   07/04/19 1010  Weight: 109 lb 3.2 oz (49.5 kg)     Exam not performed today per virtual visit   LABORATORY DATA:  I have reviewed the data as listed CBC Latest Ref Rng & Units 07/02/2019 03/02/2019 06/30/2018  WBC 4.0 - 10.5 K/uL 5.2 4.9 4.1  Hemoglobin 12.0 - 15.0 g/dL 13.4 13.1 13.2  Hematocrit 36.0 - 46.0 % 39.9 39.1 40.3  Platelets 150 - 400 K/uL 185 179 204     CMP Latest Ref Rng & Units 07/02/2019 03/02/2019 06/30/2018  Glucose 70 - 99 mg/dL 109(H) 107(H) 107(H)  BUN 8 - 23 mg/dL '19 23 22  ' Creatinine 0.44 - 1.00 mg/dL 0.98 1.07(H) 1.03(H)  Sodium 135 - 145 mmol/L 142 141 141  Potassium 3.5 - 5.1 mmol/L 3.2(L) 3.5 3.5  Chloride 98 - 111 mmol/L 103 105 105  CO2 22 - 32 mmol/L '26 24 26  ' Calcium 8.9 - 10.3 mg/dL 9.5 9.5 9.7  Total Protein 6.5 - 8.1 g/dL 7.8 7.9 7.9  Total Bilirubin 0.3 - 1.2 mg/dL 1.0 1.4(H) 1.1  Alkaline Phos 38 - 126 U/L 77 60 64  AST 15 - 41 U/L '29 20 21  ' ALT 0 - 44 U/L  67(H) 10 16      RADIOGRAPHIC STUDIES: I have personally reviewed the radiological images as listed and agreed with the findings in the report. CT Chest W Contrast  Result Date: 07/02/2019 CLINICAL DATA:  Colon cancer restaging EXAM: CT CHEST, ABDOMEN, AND PELVIS WITH CONTRAST TECHNIQUE: Multidetector CT imaging of the chest, abdomen and pelvis was performed following the standard protocol during bolus administration of intravenous contrast. CONTRAST:  24m OMNIPAQUE IOHEXOL 300 MG/ML  SOLN COMPARISON:  06/30/2018 FINDINGS: CT CHEST FINDINGS Cardiovascular: Mild atherosclerotic calcification of the aortic arch. Mediastinum/Nodes: Unremarkable Lungs/Pleura: Biapical pleuroparenchymal scarring atelectasis or scarring in the posterior basal segment right lower lobe. Musculoskeletal: Old healed fracture of the manubrium. Thoracic kyphosis. CT ABDOMEN PELVIS FINDINGS Hepatobiliary: Scattered hepatic cysts, similar distribution to the prior exam. Some of the small hypodense hepatic lesions are technically too small to characterize. Dependent gallstones in the gallbladder. Pancreas: Unremarkable Spleen: Unremarkable Adrenals/Urinary Tract: Chronic scarring in the left kidney upper pole with adjacent cyst. Adrenal glands unremarkable. Stomach/Bowel: Upper rectal anastomotic staple  line noted. Prominent stool throughout the colon favors constipation. Vascular/Lymphatic: Aortoiliac atherosclerotic vascular disease. Reproductive: Unremarkable Other: No supplemental non-categorized findings. Musculoskeletal: Left total hip prosthesis. Old right inferior pubic ramus fracture, healed. Old transverse fracture of the S3 vertebral body. Chronic appearance of perineural cysts bilaterally potentially with mild chronic bony erosions along the pedicles at L3 and L4, this is a long sternum stable appearance. Lower chest: Mild atherosclerotic calcification of the aortic arch. IMPRESSION: 1. No findings of recurrent malignancy.  2. Other imaging findings of potential clinical significance: Scattered hepatic cysts. Cholelithiasis. Chronic scarring in the left kidney upper pole with adjacent cyst. Prominent stool throughout the colon favors constipation. Old healed fractures of the manubrium, S3 vertebral body, and right inferior pubic ramus. Chronic appearance of perineural cysts, with potential erosions along the pedicles at L3 and L4. Aortic Atherosclerosis (ICD10-I70.0). Electronically Signed   By: Van Clines M.D.   On: 07/02/2019 15:38   CT Abdomen Pelvis W Contrast  Result Date: 07/02/2019 CLINICAL DATA:  Colon cancer restaging EXAM: CT CHEST, ABDOMEN, AND PELVIS WITH CONTRAST TECHNIQUE: Multidetector CT imaging of the chest, abdomen and pelvis was performed following the standard protocol during bolus administration of intravenous contrast. CONTRAST:  21m OMNIPAQUE IOHEXOL 300 MG/ML  SOLN COMPARISON:  06/30/2018 FINDINGS: CT CHEST FINDINGS Cardiovascular: Mild atherosclerotic calcification of the aortic arch. Mediastinum/Nodes: Unremarkable Lungs/Pleura: Biapical pleuroparenchymal scarring atelectasis or scarring in the posterior basal segment right lower lobe. Musculoskeletal: Old healed fracture of the manubrium. Thoracic kyphosis. CT ABDOMEN PELVIS FINDINGS Hepatobiliary: Scattered hepatic cysts, similar distribution to the prior exam. Some of the small hypodense hepatic lesions are technically too small to characterize. Dependent gallstones in the gallbladder. Pancreas: Unremarkable Spleen: Unremarkable Adrenals/Urinary Tract: Chronic scarring in the left kidney upper pole with adjacent cyst. Adrenal glands unremarkable. Stomach/Bowel: Upper rectal anastomotic staple line noted. Prominent stool throughout the colon favors constipation. Vascular/Lymphatic: Aortoiliac atherosclerotic vascular disease. Reproductive: Unremarkable Other: No supplemental non-categorized findings. Musculoskeletal: Left total hip prosthesis.  Old right inferior pubic ramus fracture, healed. Old transverse fracture of the S3 vertebral body. Chronic appearance of perineural cysts bilaterally potentially with mild chronic bony erosions along the pedicles at L3 and L4, this is a long sternum stable appearance. Lower chest: Mild atherosclerotic calcification of the aortic arch. IMPRESSION: 1. No findings of recurrent malignancy. 2. Other imaging findings of potential clinical significance: Scattered hepatic cysts. Cholelithiasis. Chronic scarring in the left kidney upper pole with adjacent cyst. Prominent stool throughout the colon favors constipation. Old healed fractures of the manubrium, S3 vertebral body, and right inferior pubic ramus. Chronic appearance of perineural cysts, with potential erosions along the pedicles at L3 and L4. Aortic Atherosclerosis (ICD10-I70.0). Electronically Signed   By: WVan ClinesM.D.   On: 07/02/2019 15:38     ASSESSMENT & PLAN:  Elizabeth FROMERis a 78y.o. female with   1. Cancer of Sigmoid Colon, invasive adenocarcinoma, grade 3, pT2 pN1b, cM0, stage IIIA -She was diagnosed in 04/2017. She is s/p sigmoid colectomy and adjuvant CAPOX. She is currently on colon cancer surveillance. -Her 09/20/18 Colonoscopy showed a [id number] was notes at 18cm and removed, benign. -I personally reviewed and discussed her CT CAP from 07/02/19 which shows no evidence of disease. I reviewed incidental findings of stable known kidney and liver cysts, and gallstones which are benign. I encouraged her to watch for related symptoms.  -Scan also showed significant amount of stool, she has mild constipation, I recommended she take stool softener. She  is agreeable.  -She is overall clinically doing well. Labs reviewed from this week, CBC and CMP and CEA WNL except K 3.2. Will increase in diet and with oral supplement.  -Will continue surveillance. F/u in 6 months. She will see her PCP in the interim   2. Bone Health  -CT CAP from  07/02/19 shows multiple fractures. This is concerning for osteoporosis.  -She has not had DEXA scan in a while, I recommend she have repeated scan with her PCP. She is agreeable and will f/u in 08/2019.  -She is currently on Fosamax. I also recommend she take Calcium and Vitamin D daily   3. Hypokalemia  -She stopped oral potassium due to difficulty swallowing. I encouraged her to try once daily and increase potassium in diet. She is willing to try. I will call in potassium today (07/04/19)   PLAN: -I called in potassium today.  -CT Scan reviewed, NED. She is clinically doing well -Lab and F/u in 6 months    No problem-specific Assessment & Plan notes found for this encounter.   No orders of the defined types were placed in this encounter.  All questions were answered. The patient knows to call the clinic with any problems, questions or concerns. No barriers to learning was detected. I spent 20 minutes counseling the patient face to face. The total time spent in the appointment was 25 minutes and more than 50% was on counseling and review of test results     Truitt Merle, MD 07/04/2019   I, Joslyn Devon, am acting as scribe for Truitt Merle, MD.   I have reviewed the above documentation for accuracy and completeness, and I agree with the above.

## 2019-07-04 ENCOUNTER — Telehealth: Payer: Self-pay | Admitting: Hematology

## 2019-07-04 ENCOUNTER — Encounter: Payer: Self-pay | Admitting: Hematology

## 2019-07-04 ENCOUNTER — Other Ambulatory Visit: Payer: Self-pay

## 2019-07-04 ENCOUNTER — Inpatient Hospital Stay (HOSPITAL_BASED_OUTPATIENT_CLINIC_OR_DEPARTMENT_OTHER): Payer: Medicare Other | Admitting: Hematology

## 2019-07-04 VITALS — BP 116/73 | HR 62 | Temp 98.2°F | Resp 16 | Ht 64.0 in | Wt 109.2 lb

## 2019-07-04 DIAGNOSIS — C187 Malignant neoplasm of sigmoid colon: Secondary | ICD-10-CM | POA: Diagnosis not present

## 2019-07-04 MED ORDER — POTASSIUM CHLORIDE CRYS ER 20 MEQ PO TBCR: 20.0000 meq | EXTENDED_RELEASE_TABLET | Freq: Every day | ORAL | 0 refills | Status: DC

## 2019-07-04 NOTE — Telephone Encounter (Signed)
Scheduled appt per 12/30 los.  Sent a message to HIM pool to get a calendar mailed out. 

## 2019-12-19 NOTE — Progress Notes (Signed)
[place]   [phone] [phone]   Clinic Follow up Note   Patient Care Team: Cyndi Bender, PA-C as PCP - General (Physician Assistant) [name], MD as Consulting Physician (General Surgery) Truitt Merle, MD as Consulting Physician (Hematology) Alla Feeling, NP as Nurse Practitioner (Nurse Practitioner)  Date of Service:  12/31/2019  CHIEF COMPLAINT: F/u of Sigmoid Cancer  SUMMARY OF ONCOLOGIC HISTORY: Oncology History Overview Note  Cancer Staging Cancer of sigmoid colon [name]) Staging form: Colon and Rectum, AJCC 8th Edition - Pathologic stage from 04/07/2017: Stage IIIA (pT2, pN1b, cM0) - Signed by Truitt Merle, MD on 04/26/2017     Cancer of sigmoid colon ([place])  03/22/2017 Procedure   Colonoscopy per Dr. Marian Sorrow A rectal examination was performed and was normal. The endoscope was then inserted into the rectum and advanced under direct visualization to about 25 cm where a large, near obstructing mass was noted. This had a malignant appearance. It was biopsied. That area could not be traversed with the adult colonoscope. Small amount of bleeding was noted from the biopsy site, but this stopped right away. 7 mm pedunculated polyp was noted at 15 cm. This was removed using hot snare and retrieved for pathology. Ink was injected into the submucosa at 20 cm (5 cm distal to the mass). The endoscope was then withdrawn and the procedure terminated.   Findings / Comments: - Mass at 25 cm. Biopsied. - Polyp removed as above.    04/07/2017 Initial Diagnosis   Cancer of sigmoid colon ([place])   04/07/2017 Pathology Results   Diagnosis Colon, segmental resection for tumor, Sigmoid - INVASIVE COLORECTAL ADENOCARCINOMA, 2.5 CM. - CARCINOMA INVADES MUSCULARIS PROPRIA. - MARGINS NOT INVOLVED. - METASTATIC CARCINOMA IN TWO OF EIGHTEEN LYMPH NODES (2/18) - PROXIMAL AND DISTAL ANASTOMOTIC RINGS FREE OF TUMOR.  Specimen: Sigmoid colon with proximal and  distal anastomotic rings. Procedure: Resection. Tumor site: Sigmoid colon. Specimen integrity: Intact. Macroscopic intactness of mesorectum: N/A Macroscopic tumor perforation: No. Invasive tumor: Maximum size: 2.5 cm. Histologic type(s): Colorectal adenocarcinoma. Histologic grade and differentiation: G3 G1: well differentiated/low grade G2: moderately differentiated/low grade G3: poorly differentiated/high grade G4: undifferentiated/high grade Type of polyp in which invasive carcinoma arose: No residual polyp. Microscopic extension of invasive tumor: Into muscularis propria. Lymph-Vascular invasion: Present. Peri-neural invasion: Present. Tumor deposit(s) (discontinuous extramural extension): No.  Resection margins: Proximal margin: Free of tumor. Distal margin: Free of tumor. Circumferential (radial) (posterior ascending, posterior descending; lateral and posterior mid-rectum; and entire lower 1/3 rectum): N/A Mesenteric margin (sigmoid and transverse): Free of tumor. Distance closest margin (if all above margins negative): 6 cm from mesenteric margin. Treatment effect (neo-adjuvant therapy): No. Additional polyp(s): No. Non-neoplastic findings: N/A Lymph nodes: number examined - 18; number positive: 2 Pathologic Staging: pT2, pN1b, pMX Ancillary studies: MSI by PCR and MMR by IHC. (JDP:gt, 04/11/17)   04/07/2017 Surgery   Laparoscopic sigmoid colectomy per Dr. Barry Dienes   04/19/2017 Imaging   CT chest, abdomen and pelvis with contrast showed no definitive evidence of metastasis, numerous indeterminate subcentimeter hypodense lesions scattered throughout the liver, likely benign.   04/27/2017 Tumor Marker   CEA Pre-op 04/04/17: 1.4 Post-op 04/27/17: 1.27   05/09/2017 Imaging   MRI Abdomen IMPRESSION: 1. No evidence of metastatic colon cancer in the abdomen. 2. Multiple simple hepatic cysts. 3. Known cholelithiasis, not well visualized. No biliary dilatation.      05/12/2017 - 11/2017 Chemotherapy   CAPOX q3weeks with Xeloda 1017m BID, 2 weeks on 1  week off. Starting 05/12/17. Xeloda held on 05/19/17 due to poor toleration. Proceed with single agent Xeloda 1064m BID starting with Cycle 2 on 06/04/17. Started cycle 3 Xeloda 1500 mg BID x14 days/7 days off on 06/25/17. Cycle 7 held on 09/16/17 due to Grade 3 hand foot syndrome. Reduced dose with Cycle 7 Xeloda 1500 mg in the morning and 1000 mg in the evening on 09/30/17.    05/19/2017 Tumor Marker   CEA: 1.35   06/23/2017 Tumor Marker   CEA: 1.31   12/21/2017 Imaging   CT AP W Contrast 12/21/17  IMPRESSION: 1. Surgical changes from a sigmoid colon resection without evidence of recurrent tumor, locoregional adenopathy or metastatic disease. 2. Stable numerous hepatic cysts. 3. Cholelithiasis. 4. Probable healed/healing mid sacral stress fracture.   06/30/2018 Imaging   CT CAP W Contrast 06/30/18  IMPRESSION: 1. No acute findings. No evidence of recurrent or metastatic carcinoma within the chest, abdomen, or pelvis. 2. Cholelithiasis. No radiographic evidence of cholecystitis.    09/20/2018 Procedure   Her 09/20/18 Colonscopy by Dr. BCoralee Rudshowed a 768mpolyp was notes at 18cm and removed, benign.    07/02/2019 Imaging   CT CAP W Contrast  IMPRESSION: 1. No findings of recurrent malignancy. 2. Other imaging findings of potential clinical significance: Scattered hepatic cysts. Cholelithiasis. Chronic scarring in the left kidney upper pole with adjacent cyst. Prominent stool throughout the colon favors constipation. Old healed fractures of the manubrium, S3 vertebral body, and right inferior pubic ramus. Chronic appearance of perineural cysts, with potential erosions along the pedicles at L3 and L4.   Aortic Atherosclerosis (ICD10-I70.0).      CURRENT THERAPY:  Surveillance  INTERVAL HISTORY:  Elizabeth GETTYs here for a follow up of sigmoid cancer. She was last seen by me 6 months ago.  She presents to the clinic with family member. She notes she is doing well. She notes she is having adequate BM, with no Bleeding. She does have issues with gallstones. She notes she is eating adequately. She is on 400 units of D3. [facility] She notes she is on Fosamax for the past several years. She notes she smoked in the past and stopped in 1982. She denies cough or SOB.     REVIEW OF SYSTEMS:   Constitutional: Denies fevers, chills or abnormal weight loss Eyes: Denies blurriness of vision Ears, nose, mouth, throat, and face: Denies mucositis or sore throat Respiratory: Denies cough, dyspnea or wheezes Cardiovascular: Denies palpitation, chest discomfort or lower extremity swelling Gastrointestinal:  Denies nausea, heartburn or change in bowel habits Skin: Denies abnormal skin rashes Lymphatics: Denies new lymphadenopathy or easy bruising Neurological:Denies numbness, tingling or new weaknesses Behavioral/Psych: Mood is stable, no new changes  All other systems were reviewed with the patient and are negative.  MEDICAL HISTORY:  Past Medical History:  Diagnosis Date  . colon ca dx'd 03/2017  . Essential hypertension   . GERD (gastroesophageal reflux disease)   . GI bleed   . High cholesterol   . Iron deficiency anemia     SURGICAL HISTORY: Past Surgical History:  Procedure Laterality Date  . COLONOSCOPY    . LAPAROSCOPIC SIGMOID COLECTOMY N/A 04/07/2017   Procedure: LAPAROSCOPIC SIGMOID COLECTOMY, ERAS PATHWAY, splenic flexure takedown;  Surgeon: ByStark KleinMD;  [facility];  Laterality: N/A;  . TONSILLECTOMY    . TOTAL HIP ARTHROPLASTY Right 2012    I  have reviewed the social history and family history with the patient and they are unchanged from previous note.  ALLERGIES:  has No Known Allergies.  MEDICATIONS:  Current Outpatient Medications  Medication Sig Dispense Refill  .  alendronate (FOSAMAX) 70 MG tablet Take 70 mg by mouth every 7 (seven) days.  11  . amLODipine (NORVASC) 10 MG tablet Take 10 mg by mouth daily. for blood pressure  1  . atorvastatin (LIPITOR) 10 MG tablet Take 10 mg by mouth daily.  3   No current facility-administered medications for this visit.    PHYSICAL EXAMINATION: ECOG PERFORMANCE STATUS: 0 - Asymptomatic  Vitals:   12/31/19 0907  BP: 116/74  Pulse: 60  Resp: 18  Temp: 97.9 F (36.6 C)  SpO2: 97%   Filed Weights   12/31/19 0907  Weight: 108 lb 1.6 oz (49 kg)    GENERAL:alert, no distress and comfortable SKIN: skin color, texture, turgor are normal, no rashes or significant lesions EYES: normal, Conjunctiva are pink and non-injected, sclera clear  NECK: supple, thyroid normal size, non-tender, without nodularity LYMPH:  no palpable lymphadenopathy in the cervical, axillary  LUNGS: clear to auscultation and percussion with normal breathing effort HEART: regular rate & rhythm and no murmurs and no lower extremity edema ABDOMEN:abdomen soft, non-tender and normal bowel sounds Musculoskeletal:no cyanosis of digits and no clubbing  NEURO: alert & oriented x 3 with fluent speech, no focal motor/sensory deficits  LABORATORY DATA:  I have reviewed the data as listed CBC Latest Ref Rng & Units 12/31/2019 07/02/2019 03/02/2019  WBC 4.0 - 10.5 K/uL 3.5(L) 5.2 4.9  Hemoglobin 12.0 - 15.0 g/dL 13.5 13.4 13.1  Hematocrit 36 - 46 % 40.2 39.9 39.1  Platelets 150 - 400 K/uL 188 185 179     CMP Latest Ref Rng & Units 12/31/2019 07/02/2019 03/02/2019  Glucose 70 - 99 mg/dL 104(H) 109(H) 107(H)  BUN 8 - 23 mg/dL '17 19 23  ' Creatinine 0.44 - 1.00 mg/dL 1.06(H) 0.98 1.07(H)  Sodium 135 - 145 mmol/L 144 142 141  Potassium 3.5 - 5.1 mmol/L 3.3(L) 3.2(L) 3.5  Chloride 98 - 111 mmol/L 106 103 105  CO2 22 - 32 mmol/L '25 26 24  ' Calcium 8.9 - 10.3 mg/dL 9.6 9.5 9.5  Total Protein 6.5 - 8.1 g/dL 7.8 7.8 7.9  Total Bilirubin 0.3 - 1.2 mg/dL  1.4(H) 1.0 1.4(H)  Alkaline Phos 38 - 126 U/L 61 77 60  AST 15 - 41 U/L '22 29 20  ' ALT 0 - 44 U/L 16 67(H) 10      RADIOGRAPHIC STUDIES: I have personally reviewed the radiological images as listed and agreed with the findings in the report. No results found.   ASSESSMENT & PLAN:  ELIYAH MCSHEA is a 79 y.o. female with    1. Cancer of Sigmoid Colon, invasive adenocarcinoma, grade 3, pT2 pN1b, cM0, stage IIIA -She was diagnosed in 04/2017. She is s/p sigmoid colectomy and adjuvant CAPOX. She is currently on colon cancer surveillance. -Her 3/18/20Colonoscopyshowed a 64m polyp was notes at 18cm and removed, benign.Her 06/2019 Surveillance CT showed NED.  -She is clinically doing well. Labs reviewed, CBC and CMP WNL except WBC 3.5, K 3.3, Cr 1/06, Tbili 1.4. CEA still pending. Physical exam unremarkable.  -Continue surveillance. She is almost 3 years since her cancer diagnosis. Will repeat last surveillance scan in 06/2020.  -F/u in 6 months with her next CT scan.    2. Osteoporosis -CT CAP from 07/02/19 shows multiple  fractures. This is concerning for osteoporosis.  -She has not had DEXA scan in a while. Her 12/13/19 shows severe osteoporosis with T-score at -5 of left forearm.  -She is currently on Fosamax, which she has been on for years.  -I recommend she take Calcium 500-645m BID and Vitamin D 1-2K units daily. I recommend she have her Vit D level checked. She will f/u with her PCP next month.  -I discussed she may have to change her Fosamax to another bisphosphonate for better management.   3. Hypokalemia  -She stopped oral potassium due to difficulty swallowing. I previously encouraged her to try once daily and increase potassium in diet.  -Her K is 3.3 today. Continue oral k    PLAN: -F/u in 6 months with lab and CT CAP w contrast a few days before.  -Start OTC Calcium and Vit D. She will check Vit D level with her PCP next month.    No problem-specific  Assessment & Plan notes found for this encounter.   Orders Placed This Encounter  Procedures  . CT Abdomen Pelvis W Contrast    Standing Status:   Future    Standing Expiration Date:   12/30/2020    Order Specific Question:   If indicated for the ordered procedure, I authorize the administration of contrast media per Radiology protocol    Answer:   Yes    Order Specific Question:   [facility]    [facility]   Order Specific Question:   Release to patient    Answer:   Immediate    Order Specific Question:   Is Oral Contrast requested for this exam?    Answer:   Yes, Per Radiology protocol    Order Specific Question:   Radiology Contrast Protocol - do NOT remove file path    Answer:   [email or web address]  . CT Chest W Contrast    Standing Status:   Future    Standing Expiration Date:   12/30/2020    Order Specific Question:   If indicated for the ordered procedure, I authorize the administration of contrast media per Radiology protocol    Answer:   Yes    Order Specific Question:   [facility]    [facility]   Order Specific Question:   Radiology Contrast Protocol - do NOT remove file path    Answer:   \\[email or web address]   All questions were answered. The patient knows to call the clinic with any problems, questions or concerns. No barriers to learning was detected.      YTruitt Merle MD 12/31/2019   I, AJoslyn Devon am acting as scribe for YTruitt Merle MD.   I have reviewed the above documentation for accuracy and completeness, and I agree with the above.

## 2019-12-31 ENCOUNTER — Inpatient Hospital Stay: Payer: Medicare Other | Attending: Hematology | Admitting: Hematology

## 2019-12-31 ENCOUNTER — Inpatient Hospital Stay: Payer: Medicare Other

## 2019-12-31 ENCOUNTER — Other Ambulatory Visit: Payer: Self-pay

## 2019-12-31 ENCOUNTER — Telehealth: Payer: Self-pay | Admitting: Hematology

## 2019-12-31 VITALS — BP 116/74 | HR 60 | Temp 97.9°F | Resp 18 | Ht 64.0 in | Wt 108.1 lb

## 2019-12-31 DIAGNOSIS — Z85038 Personal history of other malignant neoplasm of large intestine: Secondary | ICD-10-CM | POA: Insufficient documentation

## 2019-12-31 DIAGNOSIS — Z9049 Acquired absence of other specified parts of digestive tract: Secondary | ICD-10-CM | POA: Insufficient documentation

## 2019-12-31 DIAGNOSIS — C187 Malignant neoplasm of sigmoid colon: Secondary | ICD-10-CM

## 2019-12-31 DIAGNOSIS — I10 Essential (primary) hypertension: Secondary | ICD-10-CM | POA: Diagnosis not present

## 2019-12-31 DIAGNOSIS — Z9221 Personal history of antineoplastic chemotherapy: Secondary | ICD-10-CM | POA: Insufficient documentation

## 2019-12-31 DIAGNOSIS — E7800 Pure hypercholesterolemia, unspecified: Secondary | ICD-10-CM | POA: Diagnosis not present

## 2019-12-31 DIAGNOSIS — M810 Age-related osteoporosis without current pathological fracture: Secondary | ICD-10-CM | POA: Diagnosis not present

## 2019-12-31 DIAGNOSIS — K219 Gastro-esophageal reflux disease without esophagitis: Secondary | ICD-10-CM | POA: Insufficient documentation

## 2019-12-31 DIAGNOSIS — Z79899 Other long term (current) drug therapy: Secondary | ICD-10-CM | POA: Insufficient documentation

## 2019-12-31 DIAGNOSIS — E876 Hypokalemia: Secondary | ICD-10-CM | POA: Diagnosis not present

## 2019-12-31 LAB — CMP (CANCER CENTER ONLY)
ALT: 16 U/L (ref 0–44)
AST: 22 U/L (ref 15–41)
Albumin: 4.6 g/dL (ref 3.5–5.0)
Alkaline Phosphatase: 61 U/L (ref 38–126)
Anion gap: 13 (ref 5–15)
BUN: 17 mg/dL (ref 8–23)
CO2: 25 mmol/L (ref 22–32)
Calcium: 9.6 mg/dL (ref 8.9–10.3)
Chloride: 106 mmol/L (ref 98–111)
Creatinine: 1.06 mg/dL — ABNORMAL HIGH (ref 0.44–1.00)
GFR, Est AFR Am: 59 mL/min — ABNORMAL LOW (ref >60)
GFR, Estimated: 51 mL/min — ABNORMAL LOW (ref >60)
Glucose, Bld: 104 mg/dL — ABNORMAL HIGH (ref 70–99)
Potassium: 3.3 mmol/L — ABNORMAL LOW (ref 3.5–5.1)
Sodium: 144 mmol/L (ref 135–145)
Total Bilirubin: 1.4 mg/dL — ABNORMAL HIGH (ref 0.3–1.2)
Total Protein: 7.8 g/dL (ref 6.5–8.1)

## 2019-12-31 LAB — CBC WITH DIFFERENTIAL (CANCER CENTER ONLY)
Abs Immature Granulocytes: 0.01 10*3/uL (ref 0.00–0.07)
Basophils Absolute: 0 10*3/uL (ref 0.0–0.1)
Basophils Relative: 1 %
Eosinophils Absolute: 0.1 10*3/uL (ref 0.0–0.5)
Eosinophils Relative: 1 %
HCT: 40.2 % (ref 36.0–46.0)
Hemoglobin: 13.5 g/dL (ref 12.0–15.0)
Immature Granulocytes: 0 %
Lymphocytes Relative: 19 %
Lymphs Abs: 0.7 10*3/uL (ref 0.7–4.0)
MCH: 30.9 pg (ref 26.0–34.0)
MCHC: 33.6 g/dL (ref 30.0–36.0)
MCV: 92 fL (ref 80.0–100.0)
Monocytes Absolute: 0.5 10*3/uL (ref 0.1–1.0)
Monocytes Relative: 13 %
Neutro Abs: 2.3 10*3/uL (ref 1.7–7.7)
Neutrophils Relative %: 66 %
Platelet Count: 188 10*3/uL (ref 150–400)
RBC: 4.37 MIL/uL (ref 3.87–5.11)
RDW: 12.2 % (ref 11.5–15.5)
WBC Count: 3.5 10*3/uL — ABNORMAL LOW (ref 4.0–10.5)
nRBC: 0 % (ref 0.0–0.2)

## 2019-12-31 LAB — CEA (IN HOUSE-CHCC): CEA (CHCC-In House): 1.74 ng/mL (ref 0.00–5.00)

## 2019-12-31 NOTE — Telephone Encounter (Signed)
Scheduled per 06/28 los, patient received contrast and printed calender.

## 2020-01-01 ENCOUNTER — Encounter: Payer: Self-pay | Admitting: Hematology

## 2020-06-30 ENCOUNTER — Inpatient Hospital Stay: Payer: Medicare Other | Attending: Hematology

## 2020-06-30 ENCOUNTER — Other Ambulatory Visit: Payer: Self-pay

## 2020-06-30 ENCOUNTER — Other Ambulatory Visit: Payer: Medicare Other

## 2020-06-30 ENCOUNTER — Ambulatory Visit (HOSPITAL_COMMUNITY)
Admission: RE | Admit: 2020-06-30 | Discharge: 2020-06-30 | Disposition: A | Discharge status: Home / Self Care | Payer: Medicare Other | Source: Ambulatory Visit | Attending: Hematology | Admitting: Hematology

## 2020-06-30 DIAGNOSIS — C187 Malignant neoplasm of sigmoid colon: Secondary | ICD-10-CM | POA: Diagnosis present

## 2020-06-30 DIAGNOSIS — Z85038 Personal history of other malignant neoplasm of large intestine: Secondary | ICD-10-CM | POA: Diagnosis present

## 2020-06-30 LAB — CBC WITH DIFFERENTIAL (CANCER CENTER ONLY)
Abs Immature Granulocytes: 0.01 10*3/uL (ref 0.00–0.07)
Basophils Absolute: 0 10*3/uL (ref 0.0–0.1)
Basophils Relative: 1 %
Eosinophils Absolute: 0.1 10*3/uL (ref 0.0–0.5)
Eosinophils Relative: 1 %
HCT: 35.9 % — ABNORMAL LOW (ref 36.0–46.0)
Hemoglobin: 12.1 g/dL (ref 12.0–15.0)
Immature Granulocytes: 0 %
Lymphocytes Relative: 16 %
Lymphs Abs: 0.6 10*3/uL — ABNORMAL LOW (ref 0.7–4.0)
MCH: 29.8 pg (ref 26.0–34.0)
MCHC: 33.7 g/dL (ref 30.0–36.0)
MCV: 88.4 fL (ref 80.0–100.0)
Monocytes Absolute: 0.5 10*3/uL (ref 0.1–1.0)
Monocytes Relative: 13 %
Neutro Abs: 2.7 10*3/uL (ref 1.7–7.7)
Neutrophils Relative %: 69 %
Platelet Count: 180 10*3/uL (ref 150–400)
RBC: 4.06 MIL/uL (ref 3.87–5.11)
RDW: 12.5 % (ref 11.5–15.5)
WBC Count: 3.9 10*3/uL — ABNORMAL LOW (ref 4.0–10.5)
nRBC: 0 % (ref 0.0–0.2)

## 2020-06-30 LAB — CMP (CANCER CENTER ONLY)
ALT: 14 U/L (ref 0–44)
AST: 21 U/L (ref 15–41)
Albumin: 4.5 g/dL (ref 3.5–5.0)
Alkaline Phosphatase: 49 U/L (ref 38–126)
Anion gap: 8 (ref 5–15)
BUN: 18 mg/dL (ref 8–23)
CO2: 29 mmol/L (ref 22–32)
Calcium: 9.7 mg/dL (ref 8.9–10.3)
Chloride: 103 mmol/L (ref 98–111)
Creatinine: 1.13 mg/dL — ABNORMAL HIGH (ref 0.44–1.00)
GFR, Estimated: 50 mL/min — ABNORMAL LOW (ref >60)
Glucose, Bld: 106 mg/dL — ABNORMAL HIGH (ref 70–99)
Potassium: 3.1 mmol/L — ABNORMAL LOW (ref 3.5–5.1)
Sodium: 140 mmol/L (ref 135–145)
Total Bilirubin: 1.2 mg/dL (ref 0.3–1.2)
Total Protein: 7.7 g/dL (ref 6.5–8.1)

## 2020-06-30 LAB — CEA (IN HOUSE-CHCC): CEA (CHCC-In House): 1.75 ng/mL (ref 0.00–5.00)

## 2020-06-30 MED ORDER — IOHEXOL 300 MG/ML  SOLN
100.0000 mL | Freq: Once | INTRAMUSCULAR | Status: AC | PRN
  Administered 2020-06-30: 12:00:00 100 mL via INTRAVENOUS

## 2020-07-02 ENCOUNTER — Ambulatory Visit: Payer: Medicare Other | Admitting: Hematology

## 2020-07-03 ENCOUNTER — Ambulatory Visit: Payer: Medicare Other | Admitting: Hematology

## 2020-07-07 ENCOUNTER — Other Ambulatory Visit: Payer: Medicare Other

## 2020-07-07 NOTE — Progress Notes (Signed)
Elizabeth Durham   [phone] [phone]   Clinic Follow up Note   Patient Care Team: Elizabeth Bender, PA-C as PCP - General (Physician Assistant) Elizabeth Klein, MD as Consulting Physician (General Surgery) Elizabeth Merle, MD as Consulting Physician (Hematology) Elizabeth Feeling, NP as Nurse Practitioner (Nurse Practitioner)  Date of Service:  07/09/2020  CHIEF COMPLAINT: F/u of Sigmoid Cancer  SUMMARY OF ONCOLOGIC HISTORY: Oncology History Overview Note  Cancer Staging Cancer of sigmoid colon [facility]) Staging form: Colon and Rectum, AJCC 8th Edition - Pathologic stage from 04/07/2017: Stage IIIA (pT2, pN1b, cM0) - Signed by Elizabeth Merle, MD on 04/26/2017     Cancer of sigmoid colon ([place])  03/22/2017 Procedure   Colonoscopy per Dr. Marian Durham A rectal examination was performed and was normal. The endoscope was then inserted into the rectum and advanced under direct visualization to about 25 cm where a large, near obstructing mass was noted. This had a malignant appearance. It was biopsied. That area could not be traversed with the adult colonoscope. Small amount of bleeding was noted from the biopsy site, but this stopped right away. 7 mm pedunculated polyp was noted at 15 cm. This was removed using hot snare and retrieved for pathology. Ink was injected into the submucosa at 20 cm (5 cm distal to the mass). The endoscope was then withdrawn and the procedure terminated.   Findings / Comments: - Mass at 25 cm. Biopsied. - Polyp removed as above.    04/07/2017 Initial Diagnosis   Cancer of sigmoid colon ([place])   04/07/2017 Pathology Results   Diagnosis Colon, segmental resection for tumor, Sigmoid - INVASIVE COLORECTAL ADENOCARCINOMA, 2.5 CM. - CARCINOMA INVADES MUSCULARIS PROPRIA. - MARGINS NOT INVOLVED. - METASTATIC CARCINOMA IN TWO OF EIGHTEEN LYMPH NODES (2/18) - PROXIMAL AND DISTAL ANASTOMOTIC RINGS FREE OF TUMOR.  Specimen: Sigmoid colon with proximal and  distal anastomotic rings. Procedure: Resection. Tumor site: Sigmoid colon. Specimen integrity: Intact. Macroscopic intactness of mesorectum: N/A Macroscopic tumor perforation: No. Invasive tumor: Maximum size: 2.5 cm. Histologic type(s): Colorectal adenocarcinoma. Histologic grade and differentiation: G3 G1: well differentiated/low grade G2: moderately differentiated/low grade G3: poorly differentiated/high grade G4: undifferentiated/high grade Type of polyp in which invasive carcinoma arose: No residual polyp. Microscopic extension of invasive tumor: Into muscularis propria. Lymph-Vascular invasion: Present. Peri-neural invasion: Present. Tumor deposit(s) (discontinuous extramural extension): No.  Resection margins: Proximal margin: Free of tumor. Distal margin: Free of tumor. Circumferential (radial) (posterior ascending, posterior descending; lateral and posterior mid-rectum; and entire lower 1/3 rectum): N/A Mesenteric margin (sigmoid and transverse): Free of tumor. Distance closest margin (if all above margins negative): 6 cm from mesenteric margin. Treatment effect (neo-adjuvant therapy): No. Additional polyp(s): No. Non-neoplastic findings: N/A Lymph nodes: number examined - 18; number positive: 2 Pathologic Staging: pT2, pN1b, pMX Ancillary studies: MSI by PCR and MMR by IHC. (Elizabeth Durham:gt, 04/11/17)   04/07/2017 Surgery   Laparoscopic sigmoid colectomy per Dr. Barry Durham   04/19/2017 Imaging   CT chest, abdomen and pelvis with contrast showed no definitive evidence of metastasis, numerous indeterminate subcentimeter hypodense lesions scattered throughout the liver, likely benign.   04/27/2017 Tumor Marker   CEA Pre-op 04/04/17: 1.4 Post-op 04/27/17: 1.27   05/09/2017 Imaging   MRI Abdomen IMPRESSION: 1. No evidence of metastatic colon cancer in the abdomen. 2. Multiple simple hepatic cysts. 3. Known cholelithiasis, not well visualized. No biliary dilatation.     05/12/2017 - 11/2017 Chemotherapy   CAPOX q3weeks with Xeloda 1019m BID, 2 weeks on 1 week  off. Starting 05/12/17. Xeloda held on 05/19/17 due to poor toleration. Proceed with single agent Xeloda 1062m BID starting with Cycle 2 on 06/04/17. Started cycle 3 Xeloda 1500 mg BID x14 days/7 days off on 06/25/17. Cycle 7 held on 09/16/17 due to Grade 3 hand foot syndrome. Reduced dose with Cycle 7 Xeloda 1500 mg in the morning and 1000 mg in the evening on 09/30/17.    05/19/2017 Tumor Marker   CEA: 1.35   06/23/2017 Tumor Marker   CEA: 1.31   12/21/2017 Imaging   CT AP W Contrast 12/21/17  IMPRESSION: 1. Surgical changes from a sigmoid colon resection without evidence of recurrent tumor, locoregional adenopathy or metastatic disease. 2. Stable numerous hepatic cysts. 3. Cholelithiasis. 4. Probable healed/healing mid sacral stress fracture.   06/30/2018 Imaging   CT CAP W Contrast 06/30/18  IMPRESSION: 1. No acute findings. No evidence of recurrent or metastatic carcinoma within the chest, abdomen, or pelvis. 2. Cholelithiasis. No radiographic evidence of cholecystitis.    09/20/2018 Procedure   Her 09/20/18 Colonscopy by Dr. BCoralee Durham a 742mpolyp was notes at 18cm and removed, benign.    07/02/2019 Imaging   CT CAP W Contrast  IMPRESSION: 1. No findings of recurrent malignancy. 2. Other imaging findings of potential clinical significance: Scattered hepatic cysts. Cholelithiasis. Chronic scarring in the left kidney upper pole with adjacent cyst. Prominent stool throughout the colon favors constipation. Old healed fractures of the manubrium, S3 vertebral body, and right inferior pubic ramus. Chronic appearance of perineural cysts, with potential erosions along the pedicles at L3 and L4.   Aortic Atherosclerosis (ICD10-I70.0).   06/30/2020 Imaging   CT CAP  IMPRESSION: 1. Stable CTS of the chest, abdomen and pelvis. No evidence of local recurrence or metastatic disease. 2.  Stable incidental findings including hepatic and renal cysts, cholelithiasis and left renal scarring. 3. Aortic Atherosclerosis (ICD10-I70.0).        CURRENT THERAPY:  Surveillance  INTERVAL HISTORY:  Elizabeth Durham here for a follow up of sigmoid cancer. She was last seen by me 6 months ago. She presents to the clinic with family member. She notes she is doing well. She notes she will have constipation which she sues stool softener. She denies GI bleeding.     REVIEW OF SYSTEMS:   Constitutional: Denies fevers, chills or abnormal weight loss Eyes: Denies blurriness of vision Ears, nose, mouth, throat, and face: Denies mucositis or sore throat Respiratory: Denies cough, dyspnea or wheezes Cardiovascular: Denies palpitation, chest discomfort or lower extremity swelling Gastrointestinal:  Denies nausea, heartburn or change in bowel habits Skin: Denies abnormal skin rashes Lymphatics: Denies new lymphadenopathy or easy bruising Neurological:Denies numbness, tingling or new weaknesses Behavioral/Psych: Mood is stable, no new changes  All other systems were reviewed with the patient and are negative.  MEDICAL HISTORY:  Past Medical History:  Diagnosis Date  . colon ca dx'd 03/2017  . Essential hypertension   . GERD (gastroesophageal reflux disease)   . GI bleed   . High cholesterol   . Iron deficiency anemia     SURGICAL HISTORY: Past Surgical History:  Procedure Laterality Date  . COLONOSCOPY    . LAPAROSCOPIC SIGMOID COLECTOMY N/A 04/07/2017   Procedure: LAPAROSCOPIC SIGMOID COLECTOMY, ERAS PATHWAY, splenic flexure takedown;  Surgeon: ByStark KleinMD;  [facility] [place]: General;  Laterality: N/A;  . TONSILLECTOMY    . TOTAL HIP ARTHROPLASTY Right 2012    I have reviewed the social history and family history  with the patient and they are unchanged from previous note.  ALLERGIES:  has No Known Allergies.  MEDICATIONS:  Current Outpatient Medications   Medication Sig Dispense Refill  . potassium chloride SA (KLOR-CON) 20 MEQ tablet Take 1 tablet (20 mEq total) by mouth 2 (two) times daily. 30 tablet 0  . alendronate (FOSAMAX) 70 MG tablet Take 70 mg by mouth every 7 (seven) days.  11  . amLODipine (NORVASC) 10 MG tablet Take 10 mg by mouth daily. for blood pressure  1  . atorvastatin (LIPITOR) 10 MG tablet Take 10 mg by mouth daily.  3   No current facility-administered medications for this visit.    PHYSICAL EXAMINATION: ECOG PERFORMANCE STATUS: 0  Vitals:   07/09/20 0843  BP: 119/80  Pulse: 60  Resp: 17  Temp: (!) 96 F (35.6 C)  SpO2: 97%   Filed Weights   07/09/20 0843  Weight: 100 lb 8 oz (45.6 kg)    GENERAL:alert, no distress and comfortable SKIN: skin color, texture, turgor are normal, no rashes or significant lesions EYES: normal, Conjunctiva are pink and non-injected, sclera clear  NECK: supple, thyroid normal size, non-tender, without nodularity LYMPH:  no palpable lymphadenopathy in the cervical, axillary  LUNGS: clear to auscultation and percussion with normal breathing effort HEART: regular rate & rhythm and no murmurs and no lower extremity edema ABDOMEN:abdomen soft, non-tender and normal bowel sounds Musculoskeletal:no cyanosis of digits and no clubbing  NEURO: alert & oriented x 3 with fluent speech, no focal motor/sensory deficits  LABORATORY DATA:  I have reviewed the data as listed CBC Latest Ref Rng & Units 06/30/2020 12/31/2019 07/02/2019  WBC 4.0 - 10.5 K/uL 3.9(L) 3.5(L) 5.2  Hemoglobin 12.0 - 15.0 g/dL 12.1 13.5 13.4  Hematocrit 36.0 - 46.0 % 35.9(L) 40.2 39.9  Platelets 150 - 400 K/uL 180 188 185     CMP Latest Ref Rng & Units 06/30/2020 12/31/2019 07/02/2019  Glucose 70 - 99 mg/dL 106(H) 104(H) 109(H)  BUN 8 - 23 mg/dL '18 17 19  ' Creatinine 0.44 - 1.00 mg/dL 1.13(H) 1.06(H) 0.98  Sodium 135 - 145 mmol/L 140 144 142  Potassium 3.5 - 5.1 mmol/L 3.1(L) 3.3(L) 3.2(L)  Chloride 98 - 111  mmol/L 103 106 103  CO2 22 - 32 mmol/L '29 25 26  ' Calcium 8.9 - 10.3 mg/dL 9.7 9.6 9.5  Total Protein 6.5 - 8.1 g/dL 7.7 7.8 7.8  Total Bilirubin 0.3 - 1.2 mg/dL 1.2 1.4(H) 1.0  Alkaline Phos 38 - 126 U/L 49 61 77  AST 15 - 41 U/L '21 22 29  ' ALT 0 - 44 U/L 14 16 67(H)      RADIOGRAPHIC STUDIES: I have personally reviewed the radiological images as listed and agreed with the findings in the report. No results found.   ASSESSMENT & PLAN:  Elizabeth Durham is a 80 y.o. female with    1. Cancer of Sigmoid Colon, invasive adenocarcinoma, grade 3, pT2 pN1b, cM0, stage IIIA -She was diagnosed in 04/2017. She is s/p sigmoid colectomy and adjuvant CAPOX. She is currently on colon cancer surveillance. -Her 3/18/20Colonoscopyshowed a 70m polyp was notes at 18cm and removed, benign. -I personally reviewed and discussed her CT CAP from 06/30/20 which showed No evidence of local recurrence or metastatic disease. I also discussed incidental findings including hepatic and renal cysts, cholelithiasis and left renal scarring. -She is clinically doing well with manageable constipation. Lab reviewed from last week, her CBC and CMP are within normal limits except  WBC 3.9, K 3.1, BG 106, Cr 1.13. CEA is normal and stable. Her physical exam was unremarkable. There is no clinical concern for recurrence. -She was diagnosed over 3 years since her cancer diagnosis. Her risk of recurrence has significantly decreased. I do not plan to scan her unless she has concerning symptoms. Continue 5 year surveillance plan.  -F/u in 6 months    2. Osteoporosis -CT CAP from 07/02/19 shows multiple fractures. This is concerning for osteoporosis.  -Her 12/13/19 shows severe osteoporosis with T-score at -5 of left forearm.  -She is currently on Fosamax, which she has been on for years.  -I previously recommended she take Calcium 500-643m BID and Vitamin D 1-2K units daily.   3. Hypokalemia  -She previously stopped oral  potassium.  -Her K is 3.1 on 06/30/20. I will call in potassium to restart once daily.    4. Cancer screening, Health management and Low Weight  -I encouraged her to remain up to date on her age appropriate cancer screenings. Her last mammogram was in 2021 per PT.  -Her mildly elevated Cr increased to 1.13 on 06/30/20. I encouraged her to increase water intake.  -Her weight has been low and now has lost 8 pounds in the past 6 months. Her weight and appetite fluctuates, she denies weakness. I recommend she work on eating more and gaining more weight. I suggested nutritional supplement such as Ensure.    PLAN: -I called in oral Potassium for a month, she will take OTC supplement after that  -CT CAP reviewed, NED  -Lab and F/u in 6 months    No problem-specific Assessment & Plan notes found for this encounter.   No orders of the defined types were placed in this encounter.  All questions were answered. The patient knows to call the clinic with any problems, questions or concerns. No barriers to learning was detected. The total time spent in the appointment was 30 minutes.     YTruitt Merle MD 07/09/2020   I, AJoslyn Devon am acting as scribe for YTruitt Merle MD.   I have reviewed the above documentation for accuracy and completeness, and I agree with the above.

## 2020-07-09 ENCOUNTER — Telehealth: Payer: Self-pay | Admitting: Hematology

## 2020-07-09 ENCOUNTER — Inpatient Hospital Stay: Payer: Medicare Other | Attending: Hematology | Admitting: Hematology

## 2020-07-09 ENCOUNTER — Other Ambulatory Visit: Payer: Self-pay

## 2020-07-09 ENCOUNTER — Encounter: Payer: Self-pay | Admitting: Hematology

## 2020-07-09 VITALS — BP 119/80 | HR 60 | Temp 96.0°F | Resp 17 | Ht 64.0 in | Wt 100.5 lb

## 2020-07-09 DIAGNOSIS — I10 Essential (primary) hypertension: Secondary | ICD-10-CM | POA: Diagnosis not present

## 2020-07-09 DIAGNOSIS — K219 Gastro-esophageal reflux disease without esophagitis: Secondary | ICD-10-CM | POA: Diagnosis not present

## 2020-07-09 DIAGNOSIS — E876 Hypokalemia: Secondary | ICD-10-CM | POA: Diagnosis not present

## 2020-07-09 DIAGNOSIS — C187 Malignant neoplasm of sigmoid colon: Secondary | ICD-10-CM

## 2020-07-09 DIAGNOSIS — Z9049 Acquired absence of other specified parts of digestive tract: Secondary | ICD-10-CM | POA: Diagnosis not present

## 2020-07-09 DIAGNOSIS — Z79899 Other long term (current) drug therapy: Secondary | ICD-10-CM | POA: Diagnosis not present

## 2020-07-09 DIAGNOSIS — Z85038 Personal history of other malignant neoplasm of large intestine: Secondary | ICD-10-CM | POA: Diagnosis not present

## 2020-07-09 DIAGNOSIS — K5900 Constipation, unspecified: Secondary | ICD-10-CM | POA: Diagnosis not present

## 2020-07-09 DIAGNOSIS — E7800 Pure hypercholesterolemia, unspecified: Secondary | ICD-10-CM | POA: Diagnosis not present

## 2020-07-09 DIAGNOSIS — M810 Age-related osteoporosis without current pathological fracture: Secondary | ICD-10-CM | POA: Diagnosis not present

## 2020-07-09 DIAGNOSIS — Z9221 Personal history of antineoplastic chemotherapy: Secondary | ICD-10-CM | POA: Diagnosis not present

## 2020-07-09 MED ORDER — POTASSIUM CHLORIDE CRYS ER 20 MEQ PO TBCR: 20.0000 meq | EXTENDED_RELEASE_TABLET | Freq: Two times a day (BID) | ORAL | 0 refills | Status: AC

## 2020-07-09 NOTE — Telephone Encounter (Signed)
Scheduled appointments per 1/5 los. Spoke to patient who is aware of appointments date and times. Gave patient calendar print out.

## 2020-07-17 ENCOUNTER — Other Ambulatory Visit: Payer: Self-pay | Admitting: Hematology

## 2020-07-28 ENCOUNTER — Telehealth: Payer: Self-pay

## 2020-07-28 NOTE — Telephone Encounter (Signed)
Ms Lanahan left vm requesting clarification on her potassium supplements.  I returned her call and left vm reviewing Dr Ernestina Penna not.  She is to take the prescript potassium for 1 month and then take over the counter there after.

## 2020-12-25 ENCOUNTER — Telehealth: Payer: Self-pay | Admitting: Hematology

## 2020-12-25 NOTE — Telephone Encounter (Signed)
Left message with rescheduled upcoming appointment due to provider's PAL. 

## 2021-01-07 ENCOUNTER — Other Ambulatory Visit: Payer: Medicare Other

## 2021-01-07 ENCOUNTER — Ambulatory Visit: Payer: Medicare Other | Admitting: Hematology

## 2021-01-16 ENCOUNTER — Other Ambulatory Visit: Payer: Self-pay

## 2021-01-16 ENCOUNTER — Encounter: Payer: Self-pay | Admitting: Hematology

## 2021-01-16 ENCOUNTER — Inpatient Hospital Stay: Payer: Medicare Other | Attending: Hematology

## 2021-01-16 ENCOUNTER — Inpatient Hospital Stay (HOSPITAL_BASED_OUTPATIENT_CLINIC_OR_DEPARTMENT_OTHER): Payer: Medicare Other | Admitting: Hematology

## 2021-01-16 VITALS — BP 114/72 | HR 70 | Temp 98.0°F | Resp 17 | Ht 64.0 in | Wt 97.6 lb

## 2021-01-16 DIAGNOSIS — D649 Anemia, unspecified: Secondary | ICD-10-CM | POA: Insufficient documentation

## 2021-01-16 DIAGNOSIS — E7800 Pure hypercholesterolemia, unspecified: Secondary | ICD-10-CM | POA: Insufficient documentation

## 2021-01-16 DIAGNOSIS — E876 Hypokalemia: Secondary | ICD-10-CM | POA: Insufficient documentation

## 2021-01-16 DIAGNOSIS — M810 Age-related osteoporosis without current pathological fracture: Secondary | ICD-10-CM | POA: Diagnosis not present

## 2021-01-16 DIAGNOSIS — Z79899 Other long term (current) drug therapy: Secondary | ICD-10-CM | POA: Insufficient documentation

## 2021-01-16 DIAGNOSIS — C187 Malignant neoplasm of sigmoid colon: Secondary | ICD-10-CM | POA: Diagnosis not present

## 2021-01-16 DIAGNOSIS — Z85038 Personal history of other malignant neoplasm of large intestine: Secondary | ICD-10-CM | POA: Diagnosis present

## 2021-01-16 DIAGNOSIS — Z9221 Personal history of antineoplastic chemotherapy: Secondary | ICD-10-CM | POA: Diagnosis not present

## 2021-01-16 DIAGNOSIS — I10 Essential (primary) hypertension: Secondary | ICD-10-CM | POA: Insufficient documentation

## 2021-01-16 LAB — CMP (CANCER CENTER ONLY)
ALT: 10 U/L (ref 0–44)
AST: 21 U/L (ref 15–41)
Albumin: 4.5 g/dL (ref 3.5–5.0)
Alkaline Phosphatase: 50 U/L (ref 38–126)
Anion gap: 10 (ref 5–15)
BUN: 19 mg/dL (ref 8–23)
CO2: 28 mmol/L (ref 22–32)
Calcium: 10.7 mg/dL — ABNORMAL HIGH (ref 8.9–10.3)
Chloride: 102 mmol/L (ref 98–111)
Creatinine: 1.24 mg/dL — ABNORMAL HIGH (ref 0.44–1.00)
GFR, Estimated: 45 mL/min — ABNORMAL LOW (ref >60)
Glucose, Bld: 99 mg/dL (ref 70–99)
Potassium: 3.7 mmol/L (ref 3.5–5.1)
Sodium: 140 mmol/L (ref 135–145)
Total Bilirubin: 1.1 mg/dL (ref 0.3–1.2)
Total Protein: 7.7 g/dL (ref 6.5–8.1)

## 2021-01-16 LAB — CBC WITH DIFFERENTIAL (CANCER CENTER ONLY)
Abs Immature Granulocytes: 0.01 10*3/uL (ref 0.00–0.07)
Basophils Absolute: 0 10*3/uL (ref 0.0–0.1)
Basophils Relative: 1 %
Eosinophils Absolute: 0 10*3/uL (ref 0.0–0.5)
Eosinophils Relative: 1 %
HCT: 32.9 % — ABNORMAL LOW (ref 36.0–46.0)
Hemoglobin: 10.8 g/dL — ABNORMAL LOW (ref 12.0–15.0)
Immature Granulocytes: 0 %
Lymphocytes Relative: 15 %
Lymphs Abs: 0.7 10*3/uL (ref 0.7–4.0)
MCH: 28.6 pg (ref 26.0–34.0)
MCHC: 32.8 g/dL (ref 30.0–36.0)
MCV: 87.3 fL (ref 80.0–100.0)
Monocytes Absolute: 0.5 10*3/uL (ref 0.1–1.0)
Monocytes Relative: 12 %
Neutro Abs: 3.1 10*3/uL (ref 1.7–7.7)
Neutrophils Relative %: 71 %
Platelet Count: 178 10*3/uL (ref 150–400)
RBC: 3.77 MIL/uL — ABNORMAL LOW (ref 3.87–5.11)
RDW: 13 % (ref 11.5–15.5)
WBC Count: 4.3 10*3/uL (ref 4.0–10.5)
nRBC: 0 % (ref 0.0–0.2)

## 2021-01-16 LAB — RETIC PANEL
Immature Retic Fract: 14.3 % (ref 2.3–15.9)
RBC.: 3.79 MIL/uL — ABNORMAL LOW (ref 3.87–5.11)
Retic Count, Absolute: 49.4 10*3/uL (ref 19.0–186.0)
Retic Ct Pct: 1.3 % (ref 0.4–3.1)
Reticulocyte Hemoglobin: 29.3 pg (ref >27.9)

## 2021-01-16 LAB — FERRITIN: Ferritin: 9 ng/mL — ABNORMAL LOW (ref 11–307)

## 2021-01-16 LAB — CEA (IN HOUSE-CHCC): CEA (CHCC-In House): 1.83 ng/mL (ref 0.00–5.00)

## 2021-01-16 LAB — VITAMIN B12: Vitamin B-12: 275 pg/mL (ref 180–914)

## 2021-01-16 LAB — FOLATE: Folate: 19.3 ng/mL (ref >5.9)

## 2021-01-16 NOTE — Progress Notes (Signed)
[place]   [phone] [phone]   Clinic Follow up Note   Patient Care Team: Cyndi Bender, PA-C as PCP - General (Physician Assistant) [name], MD as Consulting Physician (General Surgery) Truitt Merle, MD as Consulting Physician (Hematology) Alla Feeling, NP as Nurse Practitioner (Nurse Practitioner)  Date of Service:  01/16/2021  CHIEF COMPLAINT: f/u of sigmoid colon cancer  SUMMARY OF ONCOLOGIC HISTORY: Oncology History Overview Note  Cancer Staging Cancer of sigmoid colon [facility]) Staging form: Colon and Rectum, AJCC 8th Edition - Pathologic stage from 04/07/2017: Stage IIIA (pT2, pN1b, cM0) - Signed by Truitt Merle, MD on 04/26/2017      Cancer of sigmoid colon ([place])  03/22/2017 Procedure   Colonoscopy per Dr. Marian Sorrow A rectal examination was performed and was normal. The endoscope was then inserted into the rectum and advanced under direct visualization to about 25 cm where a large, near obstructing mass was noted. This had a malignant appearance. It was biopsied. That area could not be traversed with the adult colonoscope. Small amount of bleeding was noted from the biopsy site, but this stopped right away. 7 mm pedunculated polyp was noted at 15 cm. This was removed using hot snare and retrieved for pathology. Ink was injected into the submucosa at 20 cm (5 cm distal to the mass). The endoscope was then withdrawn and the procedure terminated.   Findings / Comments: - Mass at 25 cm. Biopsied. - Polyp removed as above.    04/07/2017 Initial Diagnosis   Cancer of sigmoid colon ([place])    04/07/2017 Pathology Results   Diagnosis Colon, segmental resection for tumor, Sigmoid - INVASIVE COLORECTAL ADENOCARCINOMA, 2.5 CM. - CARCINOMA INVADES MUSCULARIS PROPRIA. - MARGINS NOT INVOLVED. - METASTATIC CARCINOMA IN TWO OF EIGHTEEN LYMPH NODES (2/18) - PROXIMAL AND DISTAL ANASTOMOTIC RINGS FREE OF TUMOR.  Specimen: Sigmoid colon with  proximal and distal anastomotic rings. Procedure: Resection. Tumor site: Sigmoid colon. Specimen integrity: Intact. Macroscopic intactness of mesorectum: N/A Macroscopic tumor perforation: No. Invasive tumor: Maximum size: 2.5 cm. Histologic type(s): Colorectal adenocarcinoma. Histologic grade and differentiation: G3 G1: well differentiated/low grade G2: moderately differentiated/low grade G3: poorly differentiated/high grade G4: undifferentiated/high grade Type of polyp in which invasive carcinoma arose: No residual polyp. Microscopic extension of invasive tumor: Into muscularis propria. Lymph-Vascular invasion: Present. Peri-neural invasion: Present. Tumor deposit(s) (discontinuous extramural extension): No.  Resection margins: Proximal margin: Free of tumor. Distal margin: Free of tumor. Circumferential (radial) (posterior ascending, posterior descending; lateral and posterior mid-rectum; and entire lower 1/3 rectum): N/A Mesenteric margin (sigmoid and transverse): Free of tumor. Distance closest margin (if all above margins negative): 6 cm from mesenteric margin. Treatment effect (neo-adjuvant therapy): No. Additional polyp(s): No. Non-neoplastic findings: N/A Lymph nodes: number examined - 18; number positive: 2 Pathologic Staging: pT2, pN1b, pMX Ancillary studies: MSI by PCR and MMR by IHC. (JDP:gt, 04/11/17)    04/07/2017 Surgery   Laparoscopic sigmoid colectomy per Dr. Barry Dienes    04/19/2017 Imaging   CT chest, abdomen and pelvis with contrast showed no definitive evidence of metastasis, numerous indeterminate subcentimeter hypodense lesions scattered throughout the liver, likely benign.    04/27/2017 Tumor Marker   CEA Pre-op 04/04/17: 1.4 Post-op 04/27/17: 1.27    05/09/2017 Imaging   MRI Abdomen IMPRESSION: 1. No evidence of metastatic colon cancer in the abdomen. 2. Multiple simple hepatic cysts. 3. Known cholelithiasis, not well visualized. No biliary  dilatation.     05/12/2017 - 11/2017 Chemotherapy   CAPOX q3weeks with  Xeloda 1011m BID, 2 weeks on 1 week off. Starting 05/12/17. Xeloda held on 05/19/17 due to poor toleration. Proceed with single agent Xeloda 10078mBID starting with Cycle 2 on 06/04/17. Started cycle 3 Xeloda 1500 mg BID x14 days/7 days off on 06/25/17. Cycle 7 held on 09/16/17 due to Grade 3 hand foot syndrome. Reduced dose with Cycle 7 Xeloda 1500 mg in the morning and 1000 mg in the evening on 09/30/17.    05/19/2017 Tumor Marker   CEA: 1.35    06/23/2017 Tumor Marker   CEA: 1.31    12/21/2017 Imaging   CT AP W Contrast 12/21/17  IMPRESSION: 1. Surgical changes from a sigmoid colon resection without evidence of recurrent tumor, locoregional adenopathy or metastatic disease. 2. Stable numerous hepatic cysts. 3. Cholelithiasis. 4. Probable healed/healing mid sacral stress fracture.    06/30/2018 Imaging   CT CAP W Contrast 06/30/18  IMPRESSION: 1. No acute findings. No evidence of recurrent or metastatic carcinoma within the chest, abdomen, or pelvis. 2. Cholelithiasis. No radiographic evidence of cholecystitis.      09/20/2018 Procedure   Her 09/20/18 Colonscopy by Dr. BiCoralee Rudhowed a 65m45molyp was notes at 18cm and removed, benign.    07/02/2019 Imaging   CT CAP W Contrast  IMPRESSION: 1. No findings of recurrent malignancy. 2. Other imaging findings of potential clinical significance: Scattered hepatic cysts. Cholelithiasis. Chronic scarring in the left kidney upper pole with adjacent cyst. Prominent stool throughout the colon favors constipation. Old healed fractures of the manubrium, S3 vertebral body, and right inferior pubic ramus. Chronic appearance of perineural cysts, with potential erosions along the pedicles at L3 and L4.   Aortic Atherosclerosis (ICD10-I70.0).   06/30/2020 Imaging   CT CAP  IMPRESSION: 1. Stable CTS of the chest, abdomen and pelvis. No evidence of local recurrence or  metastatic disease. 2. Stable incidental findings including hepatic and renal cysts, cholelithiasis and left renal scarring. 3. Aortic Atherosclerosis (ICD10-I70.0).        CURRENT THERAPY:  Surveillance  INTERVAL HISTORY:  Elizabeth Durham here for a follow up of sigmoid cancer. She was last seen by me on 07/09/20. She presents to the clinic accompanied by her daughter. She recently lost weight. She reports she lost her cat, whom she had for about 8 years. "It hit me hard! She was my baby." The death was an accident, which made it more difficult for her. Aside from this, she reports no change in her health.   All other systems were reviewed with the patient and are negative.  MEDICAL HISTORY:  Past Medical History:  Diagnosis Date   colon ca dx'd 03/2017   Essential hypertension    GERD (gastroesophageal reflux disease)    GI bleed    High cholesterol    Iron deficiency anemia     SURGICAL HISTORY: Past Surgical History:  Procedure Laterality Date   COLONOSCOPY     LAPAROSCOPIC SIGMOID COLECTOMY N/A 04/07/2017   Procedure: LAPAROSCOPIC SIGMOID COLECTOMY, ERAS PATHWAY, splenic flexure takedown;  Surgeon: ByeStark KleinD;  [facility]  Laterality: N/A;   TONSILLECTOMY     TOTAL HIP ARTHROPLASTY Right 2012    I have reviewed the social history and family history with the patient and they are unchanged from previous note.  ALLERGIES:  has No Known Allergies.  MEDICATIONS:  Current Outpatient Medications  Medication Sig Dispense Refill   alendronate (FOSAMAX) 70 MG tablet Take 70 mg by mouth every 7 (seven)  days.  11   amLODipine (NORVASC) 10 MG tablet Take 10 mg by mouth daily. for blood pressure  1   atorvastatin (LIPITOR) 10 MG tablet Take 10 mg by mouth daily.  3   potassium chloride SA (KLOR-CON) 20 MEQ tablet Take 1 tablet (20 mEq total) by mouth 2 (two) times daily. 30 tablet 0   No current facility-administered medications for this visit.     PHYSICAL EXAMINATION: ECOG PERFORMANCE STATUS: 0 - Asymptomatic  Vitals:   01/16/21 1150  BP: 114/72  Pulse: 70  Resp: 17  Temp: 98 F (36.7 C)  SpO2: 98%   Filed Weights   01/16/21 1150  Weight: 97 lb 9.6 oz (44.3 kg)    GENERAL:alert, no distress and comfortable SKIN: skin color, texture, turgor are normal, no rashes or significant lesions EYES: normal, Conjunctiva are pink and non-injected, sclera clear  NECK: supple, thyroid normal size, non-tender, without nodularity LYMPH:  no palpable lymphadenopathy in the cervical, axillary  LUNGS: clear to auscultation and percussion with normal breathing effort HEART: regular rate & rhythm and no murmurs and no lower extremity edema ABDOMEN:abdomen soft, non-tender and normal bowel sounds Musculoskeletal:no cyanosis of digits and no clubbing  NEURO: alert & oriented x 3 with fluent speech, no focal motor/sensory deficits  LABORATORY DATA:  I have reviewed the data as listed CBC Latest Ref Rng & Units 01/16/2021 06/30/2020 12/31/2019  WBC 4.0 - 10.5 K/uL 4.3 3.9(L) 3.5(L)  Hemoglobin 12.0 - 15.0 g/dL 10.8(L) 12.1 13.5  Hematocrit 36.0 - 46.0 % 32.9(L) 35.9(L) 40.2  Platelets 150 - 400 K/uL 178 180 188     CMP Latest Ref Rng & Units 06/30/2020 12/31/2019 07/02/2019  Glucose 70 - 99 mg/dL 106(H) 104(H) 109(H)  BUN 8 - 23 mg/dL '18 17 19  ' Creatinine 0.44 - 1.00 mg/dL 1.13(H) 1.06(H) 0.98  Sodium 135 - 145 mmol/L 140 144 142  Potassium 3.5 - 5.1 mmol/L 3.1(L) 3.3(L) 3.2(L)  Chloride 98 - 111 mmol/L 103 106 103  CO2 22 - 32 mmol/L '29 25 26  ' Calcium 8.9 - 10.3 mg/dL 9.7 9.6 9.5  Total Protein 6.5 - 8.1 g/dL 7.7 7.8 7.8  Total Bilirubin 0.3 - 1.2 mg/dL 1.2 1.4(H) 1.0  Alkaline Phos 38 - 126 U/L 49 61 77  AST 15 - 41 U/L '21 22 29  ' ALT 0 - 44 U/L 14 16 67(H)      RADIOGRAPHIC STUDIES: I have personally reviewed the radiological images as listed and agreed with the findings in the report. No results found.   ASSESSMENT &  PLAN:  AINSLEIGH KAKOS is a 80 y.o. female with   1. Cancer of Sigmoid Colon, invasive adenocarcinoma, grade 3, pT2 pN1b, cM0, stage IIIA -She was diagnosed in 04/2017. She is s/p sigmoid colectomy and adjuvant CAPOX. She is currently on colon cancer surveillance.  -Her 09/20/18 Colonoscopy showed a 20m polyp was notes at 18cm and removed, benign.  -CT CAP from 06/30/20 which showed No evidence of local recurrence or metastatic disease. I also discussed incidental findings including hepatic and renal cysts, cholelithiasis and left renal scarring. -She is clinically doing well with no GI complaints. CMP and CEA from today are pending. Her physical exam was unremarkable. There is no clinical concern for recurrence. -We will continue 5 year surveillance plan. She will f/u with GI for surveillance colonoscopies as directed. -F/u in 6 months   2. Mile Anemia, new -Hgb 10.8 today (01/16/21) -Her last colonoscopy was 09/2018 and was benign.  She is not yet due for repeat, per her report. -She follows her PCP every 6 months with lab work. She is due to see him next month. -We will add ferritin, folate, B12, and retic count to her lab work today.   3. Osteoporosis -CT CAP from 07/02/19 shows multiple fractures. This is concerning for osteoporosis. -Her 12/13/19 shows severe osteoporosis with T-score at -5 of left forearm.  -She is currently on Fosamax, which she has been on for years.  -I previously recommended she take Calcium 500-653m BID and Vitamin D 1-2K units daily.   4. Hypokalemia -She previously stopped oral potassium. -Her K is 3.1 on 06/30/20. I restarted her potassium at her last visit on 07/09/20. -CMP today is pending.   5. Cancer screening, Health management and Low Weight -Her last mammogram was in March or April 2022, per patient. -Her mildly elevated Cr increased to 1.13 on 06/30/20. CMP pending today. -Her weight has been low and continues to go down. She notes the recent change in  weight in the last 6 months is due to the stress of losing her cat. She knows to work on increasing her PO intake.      PLAN: -I will contact her with the results of her anemia work up. -continue f/u with PCP every 6 months as scheduled, next visit in a month  -labs in 4 and 8 months -f/u in 8 months    No problem-specific Assessment & Plan notes found for this encounter.   Orders Placed This Encounter  Procedures   Ferritin    Standing Status:   Future    Number of Occurrences:   1    Standing Expiration Date:   01/16/2022   Folate, Serum    Standing Status:   Future    Number of Occurrences:   1    Standing Expiration Date:   01/16/2022   Vitamin B12    Standing Status:   Future    Number of Occurrences:   1    Standing Expiration Date:   01/16/2022   Retic Panel    Standing Status:   Future    Number of Occurrences:   1    Standing Expiration Date:   01/16/2022   All questions were answered. The patient knows to call the clinic with any problems, questions or concerns. No barriers to learning was detected. The total time spent in the appointment was 30 minutes.     YTruitt Merle MD 01/16/2021   I, KWilburn Mylar am acting as scribe for YTruitt Merle MD.   I have reviewed the above documentation for accuracy and completeness, and I agree with the above.

## 2021-01-29 ENCOUNTER — Telehealth: Payer: Self-pay

## 2021-01-29 NOTE — Telephone Encounter (Signed)
-----   Message from Truitt Merle, MD sent at 01/19/2021  8:02 AM EDT ----- Please let pt know that her iron level is low, B12 level is also on low end of normal, I recommend OTC ferrous sulfate 2 tabs daily and B12 1040mg daily, Cr slightly elevated, encourage her to drink more water, calcium level slightly elevated, do not take calcium supplement. Also encourage her to f/u with GI for her IDA to see if she needs EGD. She is going ti see her PCP next month, please copy this phone note, lab results and my last office note to them.  Thanks.  YTruitt Merle 01/19/2021

## 2021-01-29 NOTE — Telephone Encounter (Signed)
Attempted to reach patient related to lab results and recommendations per Dr. Burr Medico.  Unable to reach patient, unable to leave a message due to line being busy.

## 2021-02-04 ENCOUNTER — Telehealth: Payer: Self-pay

## 2021-02-04 NOTE — Telephone Encounter (Signed)
This nurse attempted again to reach patient and daughter, related to lab results and recommendations per Dr. Burr Medico.  Unable to leave a message on either phone.

## 2021-04-16 ENCOUNTER — Telehealth: Payer: Self-pay | Admitting: Hematology

## 2021-04-16 NOTE — Telephone Encounter (Signed)
Rescheduled per provider sch change, Attempted to call pt, was unable to leave message, will mail calender to pt

## 2021-05-15 ENCOUNTER — Other Ambulatory Visit: Payer: Self-pay

## 2021-05-15 ENCOUNTER — Inpatient Hospital Stay: Payer: Medicare Other | Attending: Hematology

## 2021-05-15 DIAGNOSIS — C187 Malignant neoplasm of sigmoid colon: Secondary | ICD-10-CM | POA: Diagnosis not present

## 2021-05-15 DIAGNOSIS — E876 Hypokalemia: Secondary | ICD-10-CM | POA: Diagnosis not present

## 2021-05-15 DIAGNOSIS — D649 Anemia, unspecified: Secondary | ICD-10-CM | POA: Insufficient documentation

## 2021-05-15 LAB — CMP (CANCER CENTER ONLY)
ALT: 6 U/L (ref 0–44)
AST: 16 U/L (ref 15–41)
Albumin: 4.2 g/dL (ref 3.5–5.0)
Alkaline Phosphatase: 52 U/L (ref 38–126)
Anion gap: 11 (ref 5–15)
BUN: 21 mg/dL (ref 8–23)
CO2: 27 mmol/L (ref 22–32)
Calcium: 9.7 mg/dL (ref 8.9–10.3)
Chloride: 102 mmol/L (ref 98–111)
Creatinine: 1.17 mg/dL — ABNORMAL HIGH (ref 0.44–1.00)
GFR, Estimated: 48 mL/min — ABNORMAL LOW (ref >60)
Glucose, Bld: 103 mg/dL — ABNORMAL HIGH (ref 70–99)
Potassium: 3.1 mmol/L — ABNORMAL LOW (ref 3.5–5.1)
Sodium: 140 mmol/L (ref 135–145)
Total Bilirubin: 0.7 mg/dL (ref 0.3–1.2)
Total Protein: 7.1 g/dL (ref 6.5–8.1)

## 2021-05-15 LAB — CBC WITH DIFFERENTIAL (CANCER CENTER ONLY)
Abs Immature Granulocytes: 0.01 10*3/uL (ref 0.00–0.07)
Basophils Absolute: 0 10*3/uL (ref 0.0–0.1)
Basophils Relative: 0 %
Eosinophils Absolute: 0.1 10*3/uL (ref 0.0–0.5)
Eosinophils Relative: 2 %
HCT: 28.3 % — ABNORMAL LOW (ref 36.0–46.0)
Hemoglobin: 8.9 g/dL — ABNORMAL LOW (ref 12.0–15.0)
Immature Granulocytes: 0 %
Lymphocytes Relative: 20 %
Lymphs Abs: 0.7 10*3/uL (ref 0.7–4.0)
MCH: 24.3 pg — ABNORMAL LOW (ref 26.0–34.0)
MCHC: 31.4 g/dL (ref 30.0–36.0)
MCV: 77.3 fL — ABNORMAL LOW (ref 80.0–100.0)
Monocytes Absolute: 0.5 10*3/uL (ref 0.1–1.0)
Monocytes Relative: 15 %
Neutro Abs: 2.2 10*3/uL (ref 1.7–7.7)
Neutrophils Relative %: 63 %
Platelet Count: 179 10*3/uL (ref 150–400)
RBC: 3.66 MIL/uL — ABNORMAL LOW (ref 3.87–5.11)
RDW: 14.9 % (ref 11.5–15.5)
WBC Count: 3.6 10*3/uL — ABNORMAL LOW (ref 4.0–10.5)
nRBC: 0 % (ref 0.0–0.2)

## 2021-05-15 LAB — CEA (IN HOUSE-CHCC): CEA (CHCC-In House): 1.64 ng/mL (ref 0.00–5.00)

## 2021-05-18 ENCOUNTER — Other Ambulatory Visit: Payer: Self-pay | Admitting: Hematology

## 2021-05-18 ENCOUNTER — Telehealth: Payer: Self-pay | Admitting: *Deleted

## 2021-05-18 NOTE — Telephone Encounter (Signed)
Per Dr.Feng, called pt with message below. Advised she will get a call from scheduling to set up appointment. Also, informed daughter Caren Griffins as well. Pt verbalized understranding

## 2021-05-18 NOTE — Telephone Encounter (Signed)
-----   Message from Truitt Merle, MD sent at 05/17/2021  2:47 PM EST ----- Please let pt know that her anemia is getting worse, I will get her in for f/u and iv iron asap, thanks   Truitt Merle  05/17/2021

## 2021-09-02 ENCOUNTER — Telehealth: Payer: Self-pay | Admitting: Hematology

## 2021-09-02 NOTE — Telephone Encounter (Signed)
Changed upcoming appointment's provider per provider's request. Patient is aware of changes. ?

## 2021-09-11 ENCOUNTER — Other Ambulatory Visit: Payer: Medicare Other

## 2021-09-11 ENCOUNTER — Inpatient Hospital Stay (HOSPITAL_BASED_OUTPATIENT_CLINIC_OR_DEPARTMENT_OTHER): Payer: Medicare Other | Admitting: Physician Assistant

## 2021-09-11 ENCOUNTER — Other Ambulatory Visit: Payer: Self-pay

## 2021-09-11 ENCOUNTER — Inpatient Hospital Stay: Payer: Medicare Other | Attending: Physician Assistant

## 2021-09-11 ENCOUNTER — Ambulatory Visit: Payer: Medicare Other | Admitting: Hematology

## 2021-09-11 VITALS — BP 114/64 | HR 78 | Temp 97.5°F | Resp 18 | Ht 64.0 in | Wt 95.0 lb

## 2021-09-11 DIAGNOSIS — D649 Anemia, unspecified: Secondary | ICD-10-CM | POA: Diagnosis not present

## 2021-09-11 DIAGNOSIS — C187 Malignant neoplasm of sigmoid colon: Secondary | ICD-10-CM

## 2021-09-11 DIAGNOSIS — D509 Iron deficiency anemia, unspecified: Secondary | ICD-10-CM | POA: Insufficient documentation

## 2021-09-11 DIAGNOSIS — M810 Age-related osteoporosis without current pathological fracture: Secondary | ICD-10-CM | POA: Insufficient documentation

## 2021-09-11 DIAGNOSIS — I10 Essential (primary) hypertension: Secondary | ICD-10-CM | POA: Diagnosis not present

## 2021-09-11 DIAGNOSIS — E876 Hypokalemia: Secondary | ICD-10-CM | POA: Diagnosis not present

## 2021-09-11 DIAGNOSIS — Z85038 Personal history of other malignant neoplasm of large intestine: Secondary | ICD-10-CM | POA: Insufficient documentation

## 2021-09-11 LAB — CMP (CANCER CENTER ONLY)
ALT: 7 U/L (ref 0–44)
AST: 16 U/L (ref 15–41)
Albumin: 4.3 g/dL (ref 3.5–5.0)
Alkaline Phosphatase: 51 U/L (ref 38–126)
Anion gap: 8 (ref 5–15)
BUN: 15 mg/dL (ref 8–23)
CO2: 27 mmol/L (ref 22–32)
Calcium: 9.8 mg/dL (ref 8.9–10.3)
Chloride: 104 mmol/L (ref 98–111)
Creatinine: 0.91 mg/dL (ref 0.44–1.00)
GFR, Estimated: >60 mL/min (ref >60)
Glucose, Bld: 104 mg/dL — ABNORMAL HIGH (ref 70–99)
Potassium: 3.6 mmol/L (ref 3.5–5.1)
Sodium: 139 mmol/L (ref 135–145)
Total Bilirubin: 0.7 mg/dL (ref 0.3–1.2)
Total Protein: 7 g/dL (ref 6.5–8.1)

## 2021-09-11 LAB — CBC WITH DIFFERENTIAL (CANCER CENTER ONLY)
Abs Immature Granulocytes: 0.01 10*3/uL (ref 0.00–0.07)
Basophils Absolute: 0 10*3/uL (ref 0.0–0.1)
Basophils Relative: 0 %
Eosinophils Absolute: 0 10*3/uL (ref 0.0–0.5)
Eosinophils Relative: 1 %
HCT: 27.5 % — ABNORMAL LOW (ref 36.0–46.0)
Hemoglobin: 8.7 g/dL — ABNORMAL LOW (ref 12.0–15.0)
Immature Granulocytes: 0 %
Lymphocytes Relative: 19 %
Lymphs Abs: 0.7 10*3/uL (ref 0.7–4.0)
MCH: 24.9 pg — ABNORMAL LOW (ref 26.0–34.0)
MCHC: 31.6 g/dL (ref 30.0–36.0)
MCV: 78.8 fL — ABNORMAL LOW (ref 80.0–100.0)
Monocytes Absolute: 0.5 10*3/uL (ref 0.1–1.0)
Monocytes Relative: 14 %
Neutro Abs: 2.5 10*3/uL (ref 1.7–7.7)
Neutrophils Relative %: 66 %
Platelet Count: 238 10*3/uL (ref 150–400)
RBC: 3.49 MIL/uL — ABNORMAL LOW (ref 3.87–5.11)
RDW: 20.6 % — ABNORMAL HIGH (ref 11.5–15.5)
WBC Count: 3.8 10*3/uL — ABNORMAL LOW (ref 4.0–10.5)
nRBC: 0 % (ref 0.0–0.2)

## 2021-09-11 LAB — FERRITIN: Ferritin: 9 ng/mL — ABNORMAL LOW (ref 11–307)

## 2021-09-11 LAB — IRON AND IRON BINDING CAPACITY (CC-WL,HP ONLY)
Iron: 27 ug/dL — ABNORMAL LOW (ref 28–170)
Saturation Ratios: 6 % — ABNORMAL LOW (ref 10.4–31.8)
TIBC: 477 ug/dL — ABNORMAL HIGH (ref 250–450)
UIBC: 450 ug/dL — ABNORMAL HIGH (ref 148–442)

## 2021-09-11 LAB — CEA (IN HOUSE-CHCC): CEA (CHCC-In House): 1.79 ng/mL (ref 0.00–5.00)

## 2021-09-11 LAB — VITAMIN B12: Vitamin B-12: 322 pg/mL (ref 180–914)

## 2021-09-13 ENCOUNTER — Encounter: Payer: Self-pay | Admitting: Hematology

## 2021-09-13 NOTE — Progress Notes (Signed)
Elizabeth Durham   [phone] Fax:(336) [phone]   Clinic Follow up Note   Patient Care Team: Elizabeth Bender, PA-C as PCP - General (Physician Assistant) Elizabeth Klein, MD as Consulting Physician (General Surgery) Elizabeth Merle, MD as Consulting Physician (Hematology) Elizabeth Feeling, NP as Nurse Practitioner (Nurse Practitioner)  Date of Service:  09/13/2021  CHIEF COMPLAINT: f/u of sigmoid colon cancer  SUMMARY OF ONCOLOGIC HISTORY: Oncology History Overview Note  Cancer Staging Cancer of sigmoid colon [facility]) Staging form: Colon and Rectum, AJCC 8th Edition - Pathologic stage from 04/07/2017: Stage IIIA (pT2, pN1b, cM0) - Signed by Elizabeth Merle, MD on 04/26/2017     Cancer of sigmoid colon (Elizabeth Durham)  03/22/2017 Procedure   Colonoscopy per Dr. Marian Durham A rectal examination was performed and was normal. The endoscope was then inserted into the rectum and advanced under direct visualization to about 25 cm where a large, near obstructing mass was noted. This had a malignant appearance. It was biopsied. That area could not be traversed with the adult colonoscope. Small amount of bleeding was noted from the biopsy site, but this stopped right away. 7 mm pedunculated polyp was noted at 15 cm. This was removed using hot snare and retrieved for pathology. Ink was injected into the submucosa at 20 cm (5 cm distal to the mass). The endoscope was then withdrawn and the procedure terminated.   Findings / Comments: - Mass at 25 cm. Biopsied. - Polyp removed as above.    04/07/2017 Initial Diagnosis   Cancer of sigmoid colon (Elizabeth Durham)   04/07/2017 Pathology Results   Diagnosis Colon, segmental resection for tumor, Sigmoid - INVASIVE COLORECTAL ADENOCARCINOMA, 2.5 CM. - CARCINOMA INVADES MUSCULARIS PROPRIA. - MARGINS NOT INVOLVED. - METASTATIC CARCINOMA IN TWO OF EIGHTEEN LYMPH NODES (2/18) - PROXIMAL AND DISTAL ANASTOMOTIC RINGS FREE OF TUMOR.  Specimen: Sigmoid colon with proximal  and distal anastomotic rings. Procedure: Resection. Tumor site: Sigmoid colon. Specimen integrity: Intact. Macroscopic intactness of mesorectum: N/A Macroscopic tumor perforation: No. Invasive tumor: Maximum size: 2.5 cm. Histologic type(s): Colorectal adenocarcinoma. Histologic grade and differentiation: G3 G1: well differentiated/low grade G2: moderately differentiated/low grade G3: poorly differentiated/high grade G4: undifferentiated/high grade Type of polyp in which invasive carcinoma arose: No residual polyp. Microscopic extension of invasive tumor: Into muscularis propria. Lymph-Vascular invasion: Present. Peri-neural invasion: Present. Tumor deposit(s) (discontinuous extramural extension): No.  Resection margins: Proximal margin: Free of tumor. Distal margin: Free of tumor. Circumferential (radial) (posterior ascending, posterior descending; lateral and posterior mid-rectum; and entire lower 1/3 rectum): N/A Mesenteric margin (sigmoid and transverse): Free of tumor. Distance closest margin (if all above margins negative): 6 cm from mesenteric margin. Treatment effect (neo-adjuvant therapy): No. Additional polyp(s): No. Non-neoplastic findings: N/A Lymph nodes: number examined - 18; number positive: 2 Pathologic Staging: pT2, pN1b, pMX Ancillary studies: MSI by PCR and MMR by IHC. (Elizabeth Durham:gt, 04/11/17)   04/07/2017 Surgery   Laparoscopic sigmoid colectomy per Dr. Barry Durham   04/19/2017 Imaging   CT chest, abdomen and pelvis with contrast showed no definitive evidence of metastasis, numerous indeterminate subcentimeter hypodense lesions scattered throughout the liver, likely benign.   04/27/2017 Tumor Marker   CEA Pre-op 04/04/17: 1.4 Post-op 04/27/17: 1.27   05/09/2017 Imaging   MRI Abdomen IMPRESSION: 1. No evidence of metastatic colon cancer in the abdomen. 2. Multiple simple hepatic cysts. 3. Known cholelithiasis, not well visualized. No biliary dilatation.      05/12/2017 - 11/2017 Chemotherapy   CAPOX q3weeks with Xeloda 1062m BID, 2 weeks on  1 week off. Starting 05/12/17. Xeloda held on 05/19/17 due to poor toleration. Proceed with single agent Xeloda 1032m BID starting with Cycle 2 on 06/04/17. Started cycle 3 Xeloda 1500 mg BID x14 days/7 days off on 06/25/17. Cycle 7 held on 09/16/17 due to Grade 3 hand foot syndrome. Reduced dose with Cycle 7 Xeloda 1500 mg in the morning and 1000 mg in the evening on 09/30/17.    05/19/2017 Tumor Marker   CEA: 1.35   06/23/2017 Tumor Marker   CEA: 1.31   12/21/2017 Imaging   CT AP W Contrast 12/21/17  IMPRESSION: 1. Surgical changes from a sigmoid colon resection without evidence of recurrent tumor, locoregional adenopathy or metastatic disease. 2. Stable numerous hepatic cysts. 3. Cholelithiasis. 4. Probable healed/healing mid sacral stress fracture.   06/30/2018 Imaging   CT CAP W Contrast 06/30/18  IMPRESSION: 1. No acute findings. No evidence of recurrent or metastatic carcinoma within the chest, abdomen, or pelvis. 2. Cholelithiasis. No radiographic evidence of cholecystitis.     09/20/2018 Procedure   Her 09/20/18 Colonscopy by Dr. BCoralee Rudshowed a 780mpolyp was notes at 18cm and removed, benign.    07/02/2019 Imaging   CT CAP W Contrast  IMPRESSION: 1. No findings of recurrent malignancy. 2. Other imaging findings of potential clinical significance: Scattered hepatic cysts. Cholelithiasis. Chronic scarring in the left kidney upper pole with adjacent cyst. Prominent stool throughout the colon favors constipation. Old healed fractures of the manubrium, S3 vertebral body, and right inferior pubic ramus. Chronic appearance of perineural cysts, with potential erosions along the pedicles at L3 and L4.   Aortic Atherosclerosis (ICD10-I70.0).   06/30/2020 Imaging   CT CAP  IMPRESSION: 1. Stable CTS of the chest, abdomen and pelvis. No evidence of local recurrence or metastatic disease. 2.  Stable incidental findings including hepatic and renal cysts, cholelithiasis and left renal scarring. 3. Aortic Atherosclerosis (ICD10-I70.0).        CURRENT THERAPY:  Surveillance  INTERVAL HISTORY:  [name] here for a follow up of sigmoid cancer. She was last seen by Dr. FeBurr Medicon 7/15/202 and in the interim, she denies any changes to her health. She presents to the clinic accompanied by her daughter.  Elizabeth Durham she hasn't noticed any decline in her energy levels. She is still completing all her daily activities on her own. She has a good appetite and denies any weight changes. She denies nausea, vomiting or abdominal pain. She reports chronic constipation but does have a bowel movement every day. She denies easy bruising or signs of bleeding. She denies fevers, chills, night sweats, shortness of breath, chest pain or cough. She has no other complaints. Rest of the ROS is negative.   MEDICAL HISTORY:  Past Medical History:  Diagnosis Date   colon ca dx'd 03/2017   Essential hypertension    GERD (gastroesophageal reflux disease)    GI bleed    High cholesterol    Iron deficiency anemia     SURGICAL HISTORY: Past Surgical History:  Procedure Laterality Date   COLONOSCOPY     LAPAROSCOPIC SIGMOID COLECTOMY N/A 04/07/2017   Procedure: LAPAROSCOPIC SIGMOID COLECTOMY, ERAS PATHWAY, splenic flexure takedown;  Surgeon: ByStark KleinMD;  [place] Service: General;  Laterality: N/A;   TONSILLECTOMY     TOTAL HIP ARTHROPLASTY Right 2012    I have reviewed the social history and family history with the patient and they are unchanged from previous note.  ALLERGIES:  has No  Known Allergies.  MEDICATIONS:  Current Outpatient Medications  Medication Sig Dispense Refill   alendronate (FOSAMAX) 70 MG tablet Take 70 mg by mouth every 7 (seven) days.  11   amLODipine (NORVASC) 5 MG tablet Take 5 mg by mouth daily. for blood pressure  1   atorvastatin (LIPITOR) 10  MG tablet Take 10 mg by mouth daily.  3   ferrous sulfate 325 (65 FE) MG EC tablet Take 1 tablet by mouth daily.     potassium chloride SA (KLOR-CON) 20 MEQ tablet Take 1 tablet (20 mEq total) by mouth 2 (two) times daily. 30 tablet 0   No current facility-administered medications for this visit.    PHYSICAL EXAMINATION: ECOG PERFORMANCE STATUS: 0 - Asymptomatic  Vitals:   09/11/21 1158  BP: 114/64  Pulse: 78  Resp: 18  Temp: (!) 97.5 F (36.4 C)  SpO2: 99%   Filed Weights   09/11/21 1158  Weight: 95 lb (43.1 kg)    GENERAL:alert, no distress and comfortable SKIN: skin color, texture, turgor are normal, no rashes or significant lesions EYES: normal, Conjunctiva are pink and non-injected, sclera clear  NECK: supple, thyroid normal size, non-tender, without nodularity LYMPH:  no palpable lymphadenopathy in the cervical or supraclavicular region.  LUNGS: clear to auscultation and percussion with normal breathing effort HEART: regular rate & rhythm and no murmurs and no lower extremity edema ABDOMEN:abdomen soft, non-tender and normal bowel sounds Musculoskeletal:no cyanosis of digits and no clubbing  NEURO: alert & oriented x 3 with fluent speech, no focal motor/sensory deficits  LABORATORY DATA:  I have reviewed the data as listed CBC Latest Ref Rng & Units 09/11/2021 05/15/2021 01/16/2021  WBC 4.0 - 10.5 K/uL 3.8(L) 3.6(L) 4.3  Hemoglobin 12.0 - 15.0 g/dL 8.7(L) 8.9(L) 10.8(L)  Hematocrit 36.0 - 46.0 % 27.5(L) 28.3(L) 32.9(L)  Platelets 150 - 400 K/uL 238 179 178     CMP Latest Ref Rng & Units 09/11/2021 05/15/2021 01/16/2021  Glucose 70 - 99 mg/dL 104(H) 103(H) 99  BUN 8 - 23 mg/dL _0 Creatinine 0.44 - 1.00 mg/dL 0.91 1.17(H) 1.24(H)  Sodium 135 - 145 mmol/L 139 140 140  Potassium 3.5 - 5.1 mmol/L 3.6 3.1(L) 3.7  Chloride 98 - 111 mmol/L 104 102 102  CO2 22 - 32 mmol/L _1 Calcium 8.9 - 10.3 mg/dL 9.8 9.7 10.7(H)  Total Protein 6.5 - 8.1 g/dL 7.0 7.1 7.7   Total Bilirubin 0.3 - 1.2 mg/dL 0.7 0.7 1.1  Alkaline Phos 38 - 126 U/L 51 52 50  AST 15 - 41 U/L _2 ALT 0 - 44 U/L _3 RADIOGRAPHIC STUDIES: I have personally reviewed the radiological images as listed and agreed with the findings in the report. No results found.   ASSESSMENT & PLAN:  Elizabeth Durham is a 81 y.o. female with   1. Cancer of Sigmoid Colon, invasive adenocarcinoma, grade 3, pT2 pN1b, cM0, stage IIIA -She was diagnosed in 04/2017. She is s/p sigmoid colectomy and adjuvant CAPOX. She is currently on colon cancer surveillance.  -Her 09/20/18 Colonoscopy showed a 80m polyp was notes at 18cm and removed, benign.  -CT CAP from 06/30/20 which showed no evidence of local recurrence or metastatic disease. I also discussed incidental findings including hepatic and renal cysts, cholelithiasis and left renal scarring. -She is clinically doing well with no GI complaints. There is no clinical concern for recurrence. CEA is within normal  limits. -We will continue 5 year surveillance plan. She will f/u with GI for surveillance colonoscopies as directed.  2. Microcytic anemia -Hgb has progressively declined since July 2022.  -Her last colonoscopy was 09/2018 and was benign.  -Patient was recommended to start oral iron and vitamin B12 supplementation in July 2022 but patient never started. She recently started taking iron supplementation last week through PCP.  --Labs today show hgb is 8.7, MCV 78.8, ferritin 9, iron 27, TIBC 477, saturation 6%, vitamin B12 322.  --Recommend to continue oral iron pills. Will arrange for IV feraheme 510 mg once a week x 2 doses. Need a consultation with gastroenterology to evaluate for GI bleed versus malabsorption.   3. Osteoporosis -CT CAP from 07/02/19 shows multiple fractures. This is concerning for osteoporosis. -Her 12/13/19 shows severe osteoporosis with T-score at -5 of left forearm.  -She is currently on Fosamax, which she has been  on for years.  -I previously recommended she take Calcium 500-633m BID and Vitamin D 1-2K units daily.   4. Hypokalemia -Currently on potassium chloride 20 mEq BID -Potassium level is within normal limits. No further intervention is needed.    5. Cancer screening, Health management and Low Weight -Her last mammogram was in March or April 2022, per patient. Encouraged patient to continue with screening annual mammograms.    PLAN: -IV feraheme 510 mg once a week x 2 doses -Referral to gastroenterology ([facility] versus [facility]) -RTC in 6 weeks with repeat labs    No problem-specific Assessment & Plan notes found for this encounter.    Orders Placed This Encounter  Procedures   Ferritin    Standing Status:   Future    Number of Occurrences:   1    Standing Expiration Date:   09/12/2022   Iron and Iron Binding Capacity ([facility] only)    Standing Status:   Future    Number of Occurrences:   1    Standing Expiration Date:   09/12/2022   Vitamin B12    Standing Status:   Future    Number of Occurrences:   1    Standing Expiration Date:   09/11/2022     Patient and daughter expressed understanding and satisfaction with the plan provided.   I have spent a total of 30 minutes minutes of face-to-face and non-face-to-face time, preparing to see the patient, performing a medically appropriate examination, counseling and educating the patient, ordering medications/tests, referring and communicating with other health care professionals, documenting clinical information in the electronic health record, and care coordination.   IDede QueryPA-C Dept of Hematology and [facility] [place] CenterPhone: [phone]

## 2021-09-14 ENCOUNTER — Telehealth: Payer: Self-pay | Admitting: Hematology

## 2021-09-14 ENCOUNTER — Telehealth: Payer: Self-pay

## 2021-09-14 NOTE — Telephone Encounter (Signed)
Scheduled per 3/10 los, pt is aware of appts ?

## 2021-09-14 NOTE — Telephone Encounter (Signed)
Please make an urgent referral to [facility] for iron deficiency anemia with history of colon cancer ? ?STAT referral faxed and confirmation received ?

## 2021-09-24 ENCOUNTER — Encounter: Payer: Self-pay | Admitting: Internal Medicine

## 2021-09-25 ENCOUNTER — Other Ambulatory Visit: Payer: Self-pay

## 2021-09-25 ENCOUNTER — Inpatient Hospital Stay: Payer: Medicare Other

## 2021-09-25 VITALS — BP 114/72 | HR 68 | Temp 98.3°F | Resp 18

## 2021-09-25 DIAGNOSIS — D509 Iron deficiency anemia, unspecified: Secondary | ICD-10-CM | POA: Diagnosis not present

## 2021-09-25 DIAGNOSIS — D500 Iron deficiency anemia secondary to blood loss (chronic): Secondary | ICD-10-CM

## 2021-09-25 MED ORDER — SODIUM CHLORIDE 0.9 % IV SOLN: Freq: Once | INTRAVENOUS | Status: AC

## 2021-09-25 MED ORDER — SODIUM CHLORIDE 0.9 % IV SOLN
510.0000 mg | Freq: Once | INTRAVENOUS | Status: AC
  Administered 2021-09-25: 510 mg via INTRAVENOUS
  Filled 2021-09-25: qty 510

## 2021-09-25 NOTE — Patient Instructions (Signed)

## 2021-09-25 NOTE — Progress Notes (Signed)
BP 114/72 (BP Location: Right Arm, Patient Position: Sitting)   Pulse 68   Temp 98.3 ?F (36.8 ?C) (Oral)   Resp 18   SpO2 100%  ?Patient has had iron before a significant time ago- new "1st time" - patient feeling. Patient stayed for her 30 minute observation and experienced no reaction symptoms. VSS. IV removed with tip intact. ?

## 2021-10-02 ENCOUNTER — Other Ambulatory Visit: Payer: Self-pay

## 2021-10-02 ENCOUNTER — Inpatient Hospital Stay: Payer: Medicare Other | Admitting: Dietician

## 2021-10-02 ENCOUNTER — Inpatient Hospital Stay: Payer: Medicare Other

## 2021-10-02 VITALS — BP 115/72 | HR 60 | Temp 97.8°F | Resp 18

## 2021-10-02 DIAGNOSIS — D509 Iron deficiency anemia, unspecified: Secondary | ICD-10-CM | POA: Diagnosis not present

## 2021-10-02 DIAGNOSIS — D500 Iron deficiency anemia secondary to blood loss (chronic): Secondary | ICD-10-CM

## 2021-10-02 MED ORDER — SODIUM CHLORIDE 0.9 % IV SOLN
510.0000 mg | Freq: Once | INTRAVENOUS | Status: AC
  Administered 2021-10-02: 510 mg via INTRAVENOUS
  Filled 2021-10-02: qty 510

## 2021-10-02 MED ORDER — SODIUM CHLORIDE 0.9 % IV SOLN: Freq: Once | INTRAVENOUS | Status: AC

## 2021-10-02 NOTE — Progress Notes (Signed)
Patient identified on malnutrition screening tool report (MST) for weight loss.  ? ?Met with patient during infusion. Introduced self and services available at [facility]. Patient appreciative of visit. Patient reports weights go up and down. She "has always been small, even back in the 60's." Patient reports her highest weight 127 lb when pregnant. She endorses good appetite, eating 3 meals/day on most days. Occasionally will have a snack at supper time if she has eaten a big meal earlier in the day.  ? ?Patient encouraged to contact with nutrition questions/concerns. She is agreeable. Contact information provided.  ?

## 2021-10-02 NOTE — Patient Instructions (Signed)

## 2021-10-16 ENCOUNTER — Encounter: Payer: Self-pay | Admitting: Internal Medicine

## 2021-10-16 ENCOUNTER — Ambulatory Visit (INDEPENDENT_AMBULATORY_CARE_PROVIDER_SITE_OTHER): Payer: Medicare Other | Admitting: Internal Medicine

## 2021-10-16 VITALS — BP 94/60 | HR 72 | Ht 64.0 in | Wt 95.5 lb

## 2021-10-16 DIAGNOSIS — D509 Iron deficiency anemia, unspecified: Secondary | ICD-10-CM

## 2021-10-16 DIAGNOSIS — C187 Malignant neoplasm of sigmoid colon: Secondary | ICD-10-CM | POA: Diagnosis not present

## 2021-10-16 NOTE — Patient Instructions (Signed)
?  If you are age 79 or older, your body mass index should be between 23-30. Your Body mass index is 16.39 kg/m?Marland Kitchen If this is out of the aforementioned range listed, please consider follow up with your Primary Care Provider. ? ?If you are age 54 or younger, your body mass index should be between 19-25. Your Body mass index is 16.39 kg/m?Marland Kitchen If this is out of the aformentioned range listed, please consider follow up with your Primary Care Provider.  ? ?The [facility] providers would like to encourage you to use [facility] to communicate with providers for non-urgent requests or questions.  Due to long hold times on the telephone, sending your provider a message by [facility] may be a faster and more efficient way to get a response.  Please allow 48 business hours for a response.  Please remember that this is for non-urgent requests.  ? ?Dr. Lorenso Courier will touch base with Dr. Burr Medico regarding Iron Deficiency Anemia. ? ?We will contact you after we get the results of your upcoming blood counts and iron levels. At that time, we will decide whether or not to pursue Endoscopy and Colonoscopy. ? ?It was a pleasure to see you today! ? ?Thank you for trusting me with your gastrointestinal care!   ? ?Christia Reading, MD  ? ? ?

## 2021-10-16 NOTE — Progress Notes (Signed)
? ?Chief Complaint: IDA ? ?HPI : 81 year old female with history of IDA, colon cancer s/p sigmoid colectomy and chemotherapy presents for worsening of IDA. ? ?Denies hematochezia, melena, diarrhea. She has constipation ever since her colon resection in 2018. She has on average one BM every day. She has to strain to have a BM. Denies weight loss. Denies N&V, dysphagia, abdominal pain. Denies any lightheadedness or SOB. She does have some DOE on occasion while mowing the lawn but primarily attributes this to old age. Denies blood thinners. Denies fam hx of colon cancer. Denies prior EGD. Her 09/20/18 Colonscopy by Dr. Coralee Rud showed a 65m polyp was notes at 18cm and removed, benign. Denies NSAIDs. Patient has 6 grandchildren and 7 great-grandchildren.  Patient states that she had only started her iron supplementation in March 2023 ? ?Wt Readings from Last 3 Encounters:  ?10/16/21 95 lb 8 oz (43.3 kg)  ?09/11/21 95 lb (43.1 kg)  ?01/16/21 97 lb 9.6 oz (44.3 kg)  ? ?Past Medical History:  ?Diagnosis Date  ? colon ca dx'd 03/2017  ? Essential hypertension   ? GERD (gastroesophageal reflux disease)   ? GI bleed   ? High cholesterol   ? Iron deficiency anemia   ? Pneumonia   ? ?Past Surgical History:  ?Procedure Laterality Date  ? COLONOSCOPY    ? LAPAROSCOPIC SIGMOID COLECTOMY N/A 04/07/2017  ? Procedure: LAPAROSCOPIC SIGMOID COLECTOMY, ERAS PATHWAY, splenic flexure takedown;  Surgeon: BStark Klein MD;  [place]  Service: General;  Laterality: N/A;  ? TONSILLECTOMY    ? TOTAL HIP ARTHROPLASTY Left 2012  ? ?Family History  ?Problem Relation Age of Onset  ? Leukemia Mother   ? Cancer Father   ?     unknown type  ? Hypercholesterolemia Brother   ? High blood pressure Daughter   ? High Cholesterol Daughter   ? ?Social History  ? ?Tobacco Use  ? Smoking status: Former  ?  Packs/day: 0.50  ?  Years: 7.00  ?  Pack years: 3.50  ?  Types: Cigarettes  ?  Quit date: 137 ?  Years since quitting: 40.3  ? Smokeless tobacco:  Never  ?Vaping Use  ? Vaping Use: Never used  ?Substance Use Topics  ? Alcohol use: No  ?  Alcohol/week: 0.0 standard drinks  ? Drug use: No  ? ?Current Outpatient Medications  ?Medication Sig Dispense Refill  ? alendronate (FOSAMAX) 70 MG tablet Take 70 mg by mouth every 7 (seven) days.  11  ? amLODipine (NORVASC) 5 MG tablet Take 5 mg by mouth daily. for blood pressure  1  ? atorvastatin (LIPITOR) 10 MG tablet Take 10 mg by mouth daily.  3  ? ferrous sulfate 325 (65 FE) MG EC tablet Take 1 tablet by mouth daily.    ? potassium chloride SA (KLOR-CON) 20 MEQ tablet Take 1 tablet (20 mEq total) by mouth 2 (two) times daily. 30 tablet 0  ? ?No current facility-administered medications for this visit.  ? ?No Known Allergies ? ? ?Review of Systems: ?All systems reviewed and negative except where noted in HPI.  ? ?Physical Exam: ?BP 94/60 (BP Location: Left Arm, Patient Position: Sitting, Cuff Size: Normal)   Pulse 72   Ht '5\' 4"'$  (1.626 m)   Wt 95 lb 8 oz (43.3 kg)   BMI 16.39 kg/m?  ?Constitutional: Pleasant,well-developed, female in no acute distress. ?HEENT: Normocephalic and atraumatic. Conjunctivae are normal. No scleral icterus. ?Cardiovascular: Normal rate, regular rhythm.  ?  Pulmonary/chest: Effort normal and breath sounds normal. No wheezing, rales or rhonchi. ?Abdominal: Soft, nondistended, nontender. Bowel sounds active throughout. There are no masses palpable. No hepatomegaly. ?Extremities: No edema ?Neurological: Alert and oriented to person place and time. ?Skin: Skin is warm and dry. No rashes noted. ?Psychiatric: Normal mood and affect. Behavior is normal. ? ?Labs 06/2020: CBC with Hb of 12.1.  ? ?Labs 01/2021: CBC with low Hb of 10.8. Ferritin low at 9.  ? ?Labs 05/2021: CBC with low Hb of 8.9. ? ?Labs 09/2021: CBC with low WBC of 3.8 and Hb of 8.7. Ferritin low at 9 ? ?CT A/P w/contrast 06/30/20: ?IMPRESSION: ?1. Stable CTs of the chest, abdomen and pelvis. No evidence of local recurrence or metastatic  disease. ?2. Stable incidental findings including hepatic and renal cysts, ?cholelithiasis and left renal scarring. ?3. Aortic Atherosclerosis (ICD10-I70.0). ? ?ASSESSMENT AND PLAN: ?IDA ?Sigmoid colon cancer s/p sigmoid resection and chemotherapy ?Patient presents due to worsening iron deficiency anemia since 01/2021. Thus it would be reasonable to pursue EGD and colonoscopy to look for a source of her worsening iron deficiency anemia.  I went over the risk and benefits of these procedures with the patient.  Patient states that she only started oral iron supplementation 09/2021 and thus would like to wait to see how her blood counts respond to oral and IV iron therapy before making a decision on the EGD and colonoscopy. ?- Patient will have her blood counts and iron levels rechecked with Dr. Burr Medico on 4/28. Patient wishes to wait for these results before deciding on whether or not to proceed with EGD and colonoscopy.  We will plan to touch base with the patient once the results of her blood count and iron levels are back. ? ?Christia Reading, MD ? ?

## 2021-10-30 ENCOUNTER — Other Ambulatory Visit: Payer: Self-pay

## 2021-10-30 ENCOUNTER — Inpatient Hospital Stay: Payer: Medicare Other | Attending: Physician Assistant

## 2021-10-30 ENCOUNTER — Inpatient Hospital Stay (HOSPITAL_BASED_OUTPATIENT_CLINIC_OR_DEPARTMENT_OTHER): Payer: Medicare Other | Admitting: Hematology

## 2021-10-30 ENCOUNTER — Encounter: Payer: Self-pay | Admitting: Hematology

## 2021-10-30 VITALS — BP 132/73 | HR 60 | Temp 98.1°F | Resp 17 | Wt 98.1 lb

## 2021-10-30 DIAGNOSIS — M810 Age-related osteoporosis without current pathological fracture: Secondary | ICD-10-CM | POA: Insufficient documentation

## 2021-10-30 DIAGNOSIS — Z85038 Personal history of other malignant neoplasm of large intestine: Secondary | ICD-10-CM | POA: Insufficient documentation

## 2021-10-30 DIAGNOSIS — C187 Malignant neoplasm of sigmoid colon: Secondary | ICD-10-CM

## 2021-10-30 DIAGNOSIS — E876 Hypokalemia: Secondary | ICD-10-CM | POA: Insufficient documentation

## 2021-10-30 DIAGNOSIS — I10 Essential (primary) hypertension: Secondary | ICD-10-CM | POA: Diagnosis not present

## 2021-10-30 DIAGNOSIS — D509 Iron deficiency anemia, unspecified: Secondary | ICD-10-CM | POA: Insufficient documentation

## 2021-10-30 LAB — CBC WITH DIFFERENTIAL (CANCER CENTER ONLY)
Abs Immature Granulocytes: 0.01 10*3/uL (ref 0.00–0.07)
Basophils Absolute: 0 10*3/uL (ref 0.0–0.1)
Basophils Relative: 0 %
Eosinophils Absolute: 0.1 10*3/uL (ref 0.0–0.5)
Eosinophils Relative: 2 %
HCT: 33.8 % — ABNORMAL LOW (ref 36.0–46.0)
Hemoglobin: 11 g/dL — ABNORMAL LOW (ref 12.0–15.0)
Immature Granulocytes: 0 %
Lymphocytes Relative: 17 %
Lymphs Abs: 0.5 10*3/uL — ABNORMAL LOW (ref 0.7–4.0)
MCH: 29.1 pg (ref 26.0–34.0)
MCHC: 32.5 g/dL (ref 30.0–36.0)
MCV: 89.4 fL (ref 80.0–100.0)
Monocytes Absolute: 0.4 10*3/uL (ref 0.1–1.0)
Monocytes Relative: 14 %
Neutro Abs: 2 10*3/uL (ref 1.7–7.7)
Neutrophils Relative %: 67 %
Platelet Count: 154 10*3/uL (ref 150–400)
RBC: 3.78 MIL/uL — ABNORMAL LOW (ref 3.87–5.11)
RDW: 18 % — ABNORMAL HIGH (ref 11.5–15.5)
WBC Count: 3 10*3/uL — ABNORMAL LOW (ref 4.0–10.5)
nRBC: 0 % (ref 0.0–0.2)

## 2021-10-30 LAB — CMP (CANCER CENTER ONLY)
ALT: 9 U/L (ref 0–44)
AST: 16 U/L (ref 15–41)
Albumin: 4.3 g/dL (ref 3.5–5.0)
Alkaline Phosphatase: 46 U/L (ref 38–126)
Anion gap: 5 (ref 5–15)
BUN: 15 mg/dL (ref 8–23)
CO2: 28 mmol/L (ref 22–32)
Calcium: 9.6 mg/dL (ref 8.9–10.3)
Chloride: 105 mmol/L (ref 98–111)
Creatinine: 0.87 mg/dL (ref 0.44–1.00)
GFR, Estimated: >60 mL/min (ref >60)
Glucose, Bld: 101 mg/dL — ABNORMAL HIGH (ref 70–99)
Potassium: 3.7 mmol/L (ref 3.5–5.1)
Sodium: 138 mmol/L (ref 135–145)
Total Bilirubin: 0.8 mg/dL (ref 0.3–1.2)
Total Protein: 6.9 g/dL (ref 6.5–8.1)

## 2021-10-30 LAB — CEA (IN HOUSE-CHCC): CEA (CHCC-In House): 1.87 ng/mL (ref 0.00–5.00)

## 2021-10-30 LAB — FERRITIN: Ferritin: 358 ng/mL — ABNORMAL HIGH (ref 11–307)

## 2021-10-30 NOTE — Progress Notes (Signed)
?[address]   ?[phone] [phone]   ?Clinic Follow up Note  ? ?Patient Care Team: ?Fae Pippin as PCP - General (Physician Assistant) ?[name], MD as Consulting Physician (General Surgery) ?Truitt Merle, MD as Consulting Physician (Hematology) ?Alla Feeling, NP as Nurse Practitioner (Nurse Practitioner) ? ?Date of Service:  10/30/2021 ? ?CHIEF COMPLAINT: f/u of anemia, h/o colon cancer ? ?CURRENT THERAPY:  ?Surveillance ? ?ASSESSMENT & PLAN:  ?Elizabeth Durham is a 81 y.o. female with  ? ?1.  Iron deficient anemia ?-Hgb has progressively declined since July 2022.  ?-Her last colonoscopy in 09/2018 was benign.  ?-she started oral iron in early 09/2021 ?-labs on 09/11/21 showed iron 27 and ferritin 9, consistent with iron deficient anemia ?-she received two doses of IV Feraheme in 09/2021, hgb improved to 11 today (10/30/21) ?-she met with Dr. Lorenso Courier in GI on 10/16/21. She is reluctant to consider EDG and/or colonoscopy. ?-We discussed the possible etiology of her iron deficiency, including peptic ulcer, AVM in GI track, or GI malignancy.  If she has recurrent iron deficiency despite iron supplement, I plan to obtain a CT scan to further evaluate if she still declines endoscopy. ?-given she is asymptomatic, we will continue to monitor her blood counts. Plan for lab in 2 and 6 months. ? ?2. Cancer of Sigmoid Colon, invasive adenocarcinoma, grade 3, pT2 pN1b, cM0, stage IIIA ?-She was diagnosed in 04/2017. She is s/p sigmoid colectomy and adjuvant CAPOX. She is currently on colon cancer surveillance.  ?-Her 09/20/18 Colonoscopy showed a 49m polyp was notes at 18cm and removed, benign.  ?-CT CAP from 06/30/20 showed NED, with incidental findings including hepatic and renal cysts, cholelithiasis and left renal scarring. ?-CEA remains within normal limits. ?  ?3. Osteoporosis ?-CT CAP from 07/02/19 shows multiple fractures. This is concerning for osteoporosis. ?-Her 12/13/19  shows severe osteoporosis with T-score at -5 of left forearm.  ?-She is currently on Fosamax, which she has been on for years.  ?-I previously recommended she take Calcium 500-609mBID and Vitamin D 1-2K units daily. ?  ?4. Hypokalemia ?-Currently on potassium chloride 20 mEq BID ?-Potassium level is within normal limits. No further intervention is needed.  ?  ?5. Cancer screening, Health management and Low Weight ?-Her last mammogram was in March or April 2022, per patient. Encouraged patient to continue with screening annual mammograms.  ?-she connected with our dietician on 10/02/21 ?  ?  ?PLAN: ?-lab in 2 and 6 months, she will follow-up with her PCP with lab in 4 months. ?-f/u in 6 months ? ? ?No problem-specific Assessment & Plan notes found for this encounter. ? ? ?SUMMARY OF ONCOLOGIC HISTORY: ?Oncology History Overview Note  ?Cancer Staging ?[place] [facility]moid colon ([facility]?Staging form: Colon and Rectum, AJCC 8th Edition ?- Pathologic stage from 04/07/2017: Stage IIIA (pT2, pN1b, cM0) - Signed by FeTruitt MerleMD on 04/26/2017 ? ? ?  ?[place]moid colon ([facility] ?03/22/2017 Procedure  ? Colonoscopy per Dr. BiMarian SorrowA rectal examination was performed and was normal. The endoscope was then inserted into the rectum and advanced under direct visualization to about 25 cm where a large, near obstructing mass was noted. This had a malignant appearance. It was biopsied. That area could not be traversed with the adult colonoscope. Small amount of bleeding was noted from the biopsy site, but this stopped right away. 7 mm pedunculated polyp was noted at 15 cm. This was removed using hot  snare and retrieved for pathology. Ink was injected into the submucosa at 20 cm (5 cm distal to the mass). The endoscope was then withdrawn and the procedure terminated.  ? ?Findings / Comments: ?- Mass at 25 cm. Biopsied. ?- Polyp removed as above. ? ?  ?04/07/2017 Initial Diagnosis  ? Cancer of sigmoid colon ([place]) ? ?  ?04/07/2017 Pathology  Results  ? Diagnosis ?Colon, segmental resection for tumor, Sigmoid ?- INVASIVE COLORECTAL ADENOCARCINOMA, 2.5 CM. ?- CARCINOMA INVADES MUSCULARIS PROPRIA. ?- MARGINS NOT INVOLVED. ?- METASTATIC CARCINOMA IN TWO OF EIGHTEEN LYMPH NODES (2/18) ?- PROXIMAL AND DISTAL ANASTOMOTIC RINGS FREE OF TUMOR. ? ?Specimen: Sigmoid colon with proximal and distal anastomotic rings. ?Procedure: Resection. ?Tumor site: Sigmoid colon. ?Specimen integrity: Intact. ?Macroscopic intactness of mesorectum: N/A ?Macroscopic tumor perforation: No. ?Invasive tumor: ?Maximum size: 2.5 cm. ?Histologic type(s): Colorectal adenocarcinoma. ?Histologic grade and differentiation: G3 ?G1: well differentiated/low grade ?G2: moderately differentiated/low grade ?G3: poorly differentiated/high grade ?G4: undifferentiated/high grade ?Type of polyp in which invasive carcinoma arose: No residual polyp. ?Microscopic extension of invasive tumor: Into muscularis propria. ?Lymph-Vascular invasion: Present. ?Peri-neural invasion: Present. ?Tumor deposit(s) (discontinuous extramural extension): No. ? ?Resection margins: ?Proximal margin: Free of tumor. ?Distal margin: Free of tumor. ?Circumferential (radial) (posterior ascending, posterior descending; lateral ?and posterior mid-rectum; and entire lower 1/3 rectum): N/A ?Mesenteric margin (sigmoid and transverse): Free of tumor. ?Distance closest margin (if all above margins negative): 6 cm from mesenteric margin. ?Treatment effect (neo-adjuvant therapy): No. ?Additional polyp(s): No. ?Non-neoplastic findings: N/A ?Lymph nodes: number examined - 18; number positive: 2 ?Pathologic Staging: pT2, pN1b, pMX ?Ancillary studies: MSI by PCR and MMR by IHC. (JDP:gt, 04/11/17) ? ?  ?04/07/2017 Surgery  ? Laparoscopic sigmoid colectomy per Dr. Barry Dienes ? ?  ?04/19/2017 Imaging  ? CT chest, abdomen and pelvis with contrast showed no definitive evidence of metastasis, numerous indeterminate subcentimeter hypodense lesions  scattered throughout the liver, likely benign. ? ?  ?04/27/2017 Tumor Marker  ? CEA ?Pre-op 04/04/17: 1.4 ?Post-op 04/27/17: 1.27 ? ?  ?05/09/2017 Imaging  ? MRI Abdomen IMPRESSION: ?1. No evidence of metastatic colon cancer in the abdomen. ?2. Multiple simple hepatic cysts. ?3. Known cholelithiasis, not well visualized. No biliary dilatation. ?  ?  ?05/12/2017 - 11/2017 Chemotherapy  ? CAPOX q3weeks with Xeloda 1034m BID, 2 weeks on 1 week off. Starting 05/12/17. Xeloda held on 05/19/17 due to poor toleration. Proceed with single agent Xeloda 10091mBID starting with Cycle 2 on 06/04/17. Started cycle 3 Xeloda 1500 mg BID x14 days/7 days off on 06/25/17. Cycle 7 held on 09/16/17 due to Grade 3 hand foot syndrome. Reduced dose with Cycle 7 Xeloda 1500 mg in the morning and 1000 mg in the evening on 09/30/17.  ?  ?05/19/2017 Tumor Marker  ? CEA: 1.35 ? ?  ?06/23/2017 Tumor Marker  ? CEA: 1.31 ? ?  ?12/21/2017 Imaging  ? CT AP W Contrast 12/21/17  ?IMPRESSION: ?1. Surgical changes from a sigmoid colon resection without evidence ?of recurrent tumor, locoregional adenopathy or metastatic disease. ?2. Stable numerous hepatic cysts. ?3. Cholelithiasis. ?4. Probable healed/healing mid sacral stress fracture. ? ?  ?06/30/2018 Imaging  ? CT CAP W Contrast 06/30/18  ?IMPRESSION: ?1. No acute findings. No evidence of recurrent or metastatic ?carcinoma within the chest, abdomen, or pelvis. ?2. Cholelithiasis. No radiographic evidence of cholecystitis. ?  ? ?  ?09/20/2018 Procedure  ? Her 09/20/18 Colonscopy by Dr. BiCoralee Rudhowed a 34m534molyp was notes at 18cm and removed, benign.  ?  ?07/02/2019  Imaging  ? CT CAP W Contrast  ?IMPRESSION: ?1. No findings of recurrent malignancy. ?2. Other imaging findings of potential clinical significance: ?Scattered hepatic cysts. Cholelithiasis. Chronic scarring in the ?left kidney upper pole with adjacent cyst. Prominent stool ?throughout the colon favors constipation. Old healed fractures of ?the  manubrium, S3 vertebral body, and right inferior pubic ramus. ?Chronic appearance of perineural cysts, with potential erosions ?along the pedicles at L3 and L4. ?  ?Aortic Atherosclerosis (ICD10-I70.0). ?  ?07/01/19

## 2021-11-05 ENCOUNTER — Telehealth: Payer: Self-pay | Admitting: Hematology

## 2021-11-05 NOTE — Telephone Encounter (Signed)
Scheduled follow-up appointments per 4/28 los. Patient is aware. ?

## 2022-01-01 ENCOUNTER — Inpatient Hospital Stay: Payer: Medicare Other | Attending: Physician Assistant

## 2022-01-01 ENCOUNTER — Other Ambulatory Visit: Payer: Self-pay

## 2022-01-01 DIAGNOSIS — D509 Iron deficiency anemia, unspecified: Secondary | ICD-10-CM | POA: Diagnosis not present

## 2022-01-01 DIAGNOSIS — E876 Hypokalemia: Secondary | ICD-10-CM | POA: Diagnosis not present

## 2022-01-01 DIAGNOSIS — M810 Age-related osteoporosis without current pathological fracture: Secondary | ICD-10-CM | POA: Diagnosis not present

## 2022-01-01 DIAGNOSIS — Z85038 Personal history of other malignant neoplasm of large intestine: Secondary | ICD-10-CM | POA: Insufficient documentation

## 2022-01-01 DIAGNOSIS — C187 Malignant neoplasm of sigmoid colon: Secondary | ICD-10-CM

## 2022-01-01 LAB — CBC WITH DIFFERENTIAL (CANCER CENTER ONLY)
Abs Immature Granulocytes: 0.01 10*3/uL (ref 0.00–0.07)
Basophils Absolute: 0 10*3/uL (ref 0.0–0.1)
Basophils Relative: 0 %
Eosinophils Absolute: 0 10*3/uL (ref 0.0–0.5)
Eosinophils Relative: 1 %
HCT: 34.7 % — ABNORMAL LOW (ref 36.0–46.0)
Hemoglobin: 12.1 g/dL (ref 12.0–15.0)
Immature Granulocytes: 0 %
Lymphocytes Relative: 21 %
Lymphs Abs: 0.8 10*3/uL (ref 0.7–4.0)
MCH: 31.8 pg (ref 26.0–34.0)
MCHC: 34.9 g/dL (ref 30.0–36.0)
MCV: 91.1 fL (ref 80.0–100.0)
Monocytes Absolute: 0.5 10*3/uL (ref 0.1–1.0)
Monocytes Relative: 13 %
Neutro Abs: 2.5 10*3/uL (ref 1.7–7.7)
Neutrophils Relative %: 65 %
Platelet Count: 163 10*3/uL (ref 150–400)
RBC: 3.81 MIL/uL — ABNORMAL LOW (ref 3.87–5.11)
RDW: 13.2 % (ref 11.5–15.5)
WBC Count: 3.9 10*3/uL — ABNORMAL LOW (ref 4.0–10.5)
nRBC: 0 % (ref 0.0–0.2)

## 2022-01-01 LAB — CMP (CANCER CENTER ONLY)
ALT: 10 U/L (ref 0–44)
AST: 19 U/L (ref 15–41)
Albumin: 4.5 g/dL (ref 3.5–5.0)
Alkaline Phosphatase: 44 U/L (ref 38–126)
Anion gap: 9 (ref 5–15)
BUN: 21 mg/dL (ref 8–23)
CO2: 25 mmol/L (ref 22–32)
Calcium: 9.8 mg/dL (ref 8.9–10.3)
Chloride: 104 mmol/L (ref 98–111)
Creatinine: 1.03 mg/dL — ABNORMAL HIGH (ref 0.44–1.00)
GFR, Estimated: 56 mL/min — ABNORMAL LOW (ref >60)
Glucose, Bld: 95 mg/dL (ref 70–99)
Potassium: 3.7 mmol/L (ref 3.5–5.1)
Sodium: 138 mmol/L (ref 135–145)
Total Bilirubin: 0.9 mg/dL (ref 0.3–1.2)
Total Protein: 7.1 g/dL (ref 6.5–8.1)

## 2022-01-01 LAB — FERRITIN: Ferritin: 101 ng/mL (ref 11–307)

## 2022-01-04 ENCOUNTER — Telehealth: Payer: Self-pay

## 2022-01-04 LAB — CEA (IN HOUSE-CHCC): CEA (CHCC-In House): 1.67 ng/mL (ref 0.00–5.00)

## 2022-01-04 NOTE — Telephone Encounter (Signed)
Spoke with pt via telephone to inform pt that her iron levels are within normal range; therefore, her anemia has resolved.  Informed pt that Dr. Burr Medico will continue to monitor her iron and will make the necessary changes in treatment accordingly.  Pt had no further questions or concerns.

## 2022-04-30 ENCOUNTER — Inpatient Hospital Stay: Payer: Medicare Other | Attending: Hematology

## 2022-04-30 ENCOUNTER — Other Ambulatory Visit: Payer: Self-pay

## 2022-04-30 ENCOUNTER — Encounter: Payer: Self-pay | Admitting: Hematology

## 2022-04-30 ENCOUNTER — Inpatient Hospital Stay (HOSPITAL_BASED_OUTPATIENT_CLINIC_OR_DEPARTMENT_OTHER): Payer: Medicare Other | Admitting: Hematology

## 2022-04-30 VITALS — BP 136/82 | HR 60 | Temp 98.0°F | Resp 18 | Ht 64.0 in | Wt 102.3 lb

## 2022-04-30 DIAGNOSIS — I10 Essential (primary) hypertension: Secondary | ICD-10-CM | POA: Diagnosis not present

## 2022-04-30 DIAGNOSIS — Z85038 Personal history of other malignant neoplasm of large intestine: Secondary | ICD-10-CM | POA: Diagnosis not present

## 2022-04-30 DIAGNOSIS — Z9049 Acquired absence of other specified parts of digestive tract: Secondary | ICD-10-CM | POA: Insufficient documentation

## 2022-04-30 DIAGNOSIS — D509 Iron deficiency anemia, unspecified: Secondary | ICD-10-CM | POA: Diagnosis present

## 2022-04-30 DIAGNOSIS — M810 Age-related osteoporosis without current pathological fracture: Secondary | ICD-10-CM | POA: Diagnosis not present

## 2022-04-30 DIAGNOSIS — Z96649 Presence of unspecified artificial hip joint: Secondary | ICD-10-CM | POA: Diagnosis not present

## 2022-04-30 DIAGNOSIS — C187 Malignant neoplasm of sigmoid colon: Secondary | ICD-10-CM | POA: Diagnosis not present

## 2022-04-30 LAB — CBC WITH DIFFERENTIAL (CANCER CENTER ONLY)
Abs Immature Granulocytes: 0.01 10*3/uL (ref 0.00–0.07)
Basophils Absolute: 0 10*3/uL (ref 0.0–0.1)
Basophils Relative: 1 %
Eosinophils Absolute: 0.1 10*3/uL (ref 0.0–0.5)
Eosinophils Relative: 1 %
HCT: 38.3 % (ref 36.0–46.0)
Hemoglobin: 13.2 g/dL (ref 12.0–15.0)
Immature Granulocytes: 0 %
Lymphocytes Relative: 16 %
Lymphs Abs: 0.7 10*3/uL (ref 0.7–4.0)
MCH: 31.1 pg (ref 26.0–34.0)
MCHC: 34.5 g/dL (ref 30.0–36.0)
MCV: 90.3 fL (ref 80.0–100.0)
Monocytes Absolute: 0.6 10*3/uL (ref 0.1–1.0)
Monocytes Relative: 13 %
Neutro Abs: 3 10*3/uL (ref 1.7–7.7)
Neutrophils Relative %: 69 %
Platelet Count: 180 10*3/uL (ref 150–400)
RBC: 4.24 MIL/uL (ref 3.87–5.11)
RDW: 11.9 % (ref 11.5–15.5)
WBC Count: 4.4 10*3/uL (ref 4.0–10.5)
nRBC: 0 % (ref 0.0–0.2)

## 2022-04-30 LAB — CMP (CANCER CENTER ONLY)
ALT: 9 U/L (ref 0–44)
AST: 17 U/L (ref 15–41)
Albumin: 4.4 g/dL (ref 3.5–5.0)
Alkaline Phosphatase: 55 U/L (ref 38–126)
Anion gap: 5 (ref 5–15)
BUN: 15 mg/dL (ref 8–23)
CO2: 29 mmol/L (ref 22–32)
Calcium: 10.1 mg/dL (ref 8.9–10.3)
Chloride: 103 mmol/L (ref 98–111)
Creatinine: 0.89 mg/dL (ref 0.44–1.00)
GFR, Estimated: >60 mL/min (ref >60)
Glucose, Bld: 109 mg/dL — ABNORMAL HIGH (ref 70–99)
Potassium: 3.9 mmol/L (ref 3.5–5.1)
Sodium: 137 mmol/L (ref 135–145)
Total Bilirubin: 0.6 mg/dL (ref 0.3–1.2)
Total Protein: 7.3 g/dL (ref 6.5–8.1)

## 2022-04-30 LAB — CEA (IN HOUSE-CHCC): CEA (CHCC-In House): 1.66 ng/mL (ref 0.00–5.00)

## 2022-04-30 LAB — FERRITIN: Ferritin: 73 ng/mL (ref 11–307)

## 2022-04-30 NOTE — Progress Notes (Signed)
[facility]   [phone] [phone]) [phone]   Clinic Follow up Note   Patient Care Team: Cyndi Bender, PA-C as PCP - General (Physician Assistant) [name], MD as Consulting Physician (General Surgery) Truitt Merle, MD as Consulting Physician (Hematology) Alla Feeling, NP as Nurse Practitioner (Nurse Practitioner)  Date of Service:  04/30/2022  CHIEF COMPLAINT: f/u of anemia, h/o colon cancer  CURRENT THERAPY:  Surveillance  ASSESSMENT & PLAN:  Elizabeth Durham is a 81 y.o. female with   1.  Iron deficient anemia -Hgb has progressively declined since July 2022.  -Her last colonoscopy in 09/2018 was benign.  -she started oral iron in early 09/2021 -labs on 09/11/21 showed iron 27 and ferritin 9, consistent with iron deficient anemia -she received two doses of IV Feraheme in 09/2021, hgb improved to 11 today (10/30/21) -she met with Dr. Lorenso Courier in GI on 10/16/21. She is reluctant to consider EDG and/or colonoscopy. -labs reviewed, her CBC is now WNL today. I encouraged her to continue to monitor with her PCP, and I will see her back as needed.   2. Cancer of Sigmoid Colon, invasive adenocarcinoma, grade 3, pT2 pN1b, cM0, stage IIIA -diagnosed in 04/2017. S/p sigmoid colectomy and adjuvant CAPOX. She is currently on surveillance.  -Her 09/20/18 Colonoscopy was benign.  -CT CAP from 06/30/20 showed NED. -CEA remains within normal limits.   3. Osteoporosis -CT CAP from 07/02/19 shows multiple fractures. This is concerning for osteoporosis. -Her 12/13/19 shows severe osteoporosis with T-score at -5 of left forearm. Repeat 03/27/22 was overall stable. -She continues on Fosamax, which she has been on for years.    4. Low Weight -she connected with our dietician on 10/02/21 -her weight is now above 100 lbs, I encouraged her to continue.     PLAN: -f/u open   No problem-specific Assessment & Plan notes found for this encounter.   SUMMARY OF ONCOLOGIC  HISTORY: Oncology History Overview Note  Cancer Staging Cancer of sigmoid colon [facility]) Staging form: Colon and Rectum, AJCC 8th Edition - Pathologic stage from 04/07/2017: Stage IIIA (pT2, pN1b, cM0) - Signed by Truitt Merle, MD on 04/26/2017     Cancer of sigmoid colon ([place])  03/22/2017 Procedure   Colonoscopy per Dr. Marian Sorrow A rectal examination was performed and was normal. The endoscope was then inserted into the rectum and advanced under direct visualization to about 25 cm where a large, near obstructing mass was noted. This had a malignant appearance. It was biopsied. That area could not be traversed with the adult colonoscope. Small amount of bleeding was noted from the biopsy site, but this stopped right away. 7 mm pedunculated polyp was noted at 15 cm. This was removed using hot snare and retrieved for pathology. Ink was injected into the submucosa at 20 cm (5 cm distal to the mass). The endoscope was then withdrawn and the procedure terminated.   Findings / Comments: - Mass at 25 cm. Biopsied. - Polyp removed as above.    04/07/2017 Initial Diagnosis   Cancer of sigmoid colon ([place])   04/07/2017 Pathology Results   Diagnosis Colon, segmental resection for tumor, Sigmoid - INVASIVE COLORECTAL ADENOCARCINOMA, 2.5 CM. - CARCINOMA INVADES MUSCULARIS PROPRIA. - MARGINS NOT INVOLVED. - METASTATIC CARCINOMA IN TWO OF EIGHTEEN LYMPH NODES (2/18) - PROXIMAL AND DISTAL ANASTOMOTIC RINGS FREE OF TUMOR.  Specimen: Sigmoid colon with proximal and distal anastomotic rings. Procedure: Resection. Tumor site: Sigmoid colon. Specimen integrity: Intact. Macroscopic intactness of mesorectum: N/A Macroscopic  tumor perforation: No. Invasive tumor: Maximum size: 2.5 cm. Histologic type(s): Colorectal adenocarcinoma. Histologic grade and differentiation: G3 G1: well differentiated/low grade G2: moderately differentiated/low grade G3: poorly differentiated/high grade G4: undifferentiated/high  grade Type of polyp in which invasive carcinoma arose: No residual polyp. Microscopic extension of invasive tumor: Into muscularis propria. Lymph-Vascular invasion: Present. Peri-neural invasion: Present. Tumor deposit(s) (discontinuous extramural extension): No.  Resection margins: Proximal margin: Free of tumor. Distal margin: Free of tumor. Circumferential (radial) (posterior ascending, posterior descending; lateral and posterior mid-rectum; and entire lower 1/3 rectum): N/A Mesenteric margin (sigmoid and transverse): Free of tumor. Distance closest margin (if all above margins negative): 6 cm from mesenteric margin. Treatment effect (neo-adjuvant therapy): No. Additional polyp(s): No. Non-neoplastic findings: N/A Lymph nodes: number examined - 18; number positive: 2 Pathologic Staging: pT2, pN1b, pMX Ancillary studies: MSI by PCR and MMR by IHC. (JDP:gt, 04/11/17)   04/07/2017 Surgery   Laparoscopic sigmoid colectomy per Dr. Barry Dienes   04/19/2017 Imaging   CT chest, abdomen and pelvis with contrast showed no definitive evidence of metastasis, numerous indeterminate subcentimeter hypodense lesions scattered throughout the liver, likely benign.   04/27/2017 Tumor Marker   CEA Pre-op 04/04/17: 1.4 Post-op 04/27/17: 1.27   05/09/2017 Imaging   MRI Abdomen IMPRESSION: 1. No evidence of metastatic colon cancer in the abdomen. 2. Multiple simple hepatic cysts. 3. Known cholelithiasis, not well visualized. No biliary dilatation.     05/12/2017 - 11/2017 Chemotherapy   CAPOX q3weeks with Xeloda 10100m BID, 2 weeks on 1 week off. Starting 05/12/17. Xeloda held on 05/19/17 due to poor toleration. Proceed with single agent Xeloda 10015mBID starting with Cycle 2 on 06/04/17. Started cycle 3 Xeloda 1500 mg BID x14 days/7 days off on 06/25/17. Cycle 7 held on 09/16/17 due to Grade 3 hand foot syndrome. Reduced dose with Cycle 7 Xeloda 1500 mg in the morning and 1000 mg in the evening on 09/30/17.     05/19/2017 Tumor Marker   CEA: 1.35   06/23/2017 Tumor Marker   CEA: 1.31   12/21/2017 Imaging   CT AP W Contrast 12/21/17  IMPRESSION: 1. Surgical changes from a sigmoid colon resection without evidence of recurrent tumor, locoregional adenopathy or metastatic disease. 2. Stable numerous hepatic cysts. 3. Cholelithiasis. 4. Probable healed/healing mid sacral stress fracture.   06/30/2018 Imaging   CT CAP W Contrast 06/30/18  IMPRESSION: 1. No acute findings. No evidence of recurrent or metastatic carcinoma within the chest, abdomen, or pelvis. 2. Cholelithiasis. No radiographic evidence of cholecystitis.     09/20/2018 Procedure   Her 09/20/18 Colonscopy by Dr. BiCoralee Rudhowed a 100m40molyp was notes at 18cm and removed, benign.    07/02/2019 Imaging   CT CAP W Contrast  IMPRESSION: 1. No findings of recurrent malignancy. 2. Other imaging findings of potential clinical significance: Scattered hepatic cysts. Cholelithiasis. Chronic scarring in the left kidney upper pole with adjacent cyst. Prominent stool throughout the colon favors constipation. Old healed fractures of the manubrium, S3 vertebral body, and right inferior pubic ramus. Chronic appearance of perineural cysts, with potential erosions along the pedicles at L3 and L4.   Aortic Atherosclerosis (ICD10-I70.0).   06/30/2020 Imaging   CT CAP  IMPRESSION: 1. Stable CTS of the chest, abdomen and pelvis. No evidence of local recurrence or metastatic disease. 2. Stable incidental findings including hepatic and renal cysts, cholelithiasis and left renal scarring. 3. Aortic Atherosclerosis (ICD10-I70.0).        INTERVAL HISTORY:  Elizabeth Durham here for  a follow up of anemia. She was last seen by me on 10/30/21. She presents to the clinic accompanied by her daughter. She reports she is doing well overall, no new concerns.   All other systems were reviewed with the patient and are negative.  MEDICAL HISTORY:   Past Medical History:  Diagnosis Date   colon ca dx'd 03/2017   Essential hypertension    GERD (gastroesophageal reflux disease)    GI bleed    High cholesterol    Iron deficiency anemia    Pneumonia     SURGICAL HISTORY: Past Surgical History:  Procedure Laterality Date   COLONOSCOPY     LAPAROSCOPIC SIGMOID COLECTOMY N/A 04/07/2017   Procedure: LAPAROSCOPIC SIGMOID COLECTOMY, ERAS PATHWAY, splenic flexure takedown;  Surgeon: [name], MD;  Location: [facility];  Service: General;  Laterality: N/A;   TONSILLECTOMY     TOTAL HIP ARTHROPLASTY Left 2012    I have reviewed the social history and family history with the patient and they are unchanged from previous note.  ALLERGIES:  has No Known Allergies.  MEDICATIONS:  Current Outpatient Medications  Medication Sig Dispense Refill   alendronate (FOSAMAX) 70 MG tablet Take 70 mg by mouth every 7 (seven) days.  11   amLODipine (NORVASC) 5 MG tablet Take 5 mg by mouth daily. for blood pressure  1   atorvastatin (LIPITOR) 10 MG tablet Take 10 mg by mouth daily.  3   ferrous sulfate 325 (65 FE) MG EC tablet Take 1 tablet by mouth daily.     potassium chloride SA (KLOR-CON) 20 MEQ tablet Take 1 tablet (20 mEq total) by mouth 2 (two) times daily. 30 tablet 0   No current facility-administered medications for this visit.    PHYSICAL EXAMINATION: ECOG PERFORMANCE STATUS: 0 - Asymptomatic  Vitals:   04/30/22 1330  BP: 136/82  Pulse: 60  Resp: 18  Temp: 98 F (36.7 C)  SpO2: 96%   Wt Readings from Last 3 Encounters:  04/30/22 102 lb 4.8 oz (46.4 kg)  10/30/21 98 lb 2 oz (44.5 kg)  10/16/21 95 lb 8 oz (43.3 kg)     GENERAL:alert, no distress and comfortable SKIN: skin color, texture, turgor are normal, no rashes or significant lesions EYES: normal, Conjunctiva are pink and non-injected, sclera clear  NECK: supple, thyroid normal size, non-tender, without nodularity LYMPH:  no palpable lymphadenopathy in the cervical,  axillary  LUNGS: clear to auscultation and percussion with normal breathing effort HEART: regular rate & rhythm and no murmurs and no lower extremity edema ABDOMEN:abdomen soft, non-tender and normal bowel sounds Musculoskeletal:no cyanosis of digits and no clubbing  NEURO: alert & oriented x 3 with fluent speech, no focal motor/sensory deficits  LABORATORY DATA:  I have reviewed the data as listed    Latest Ref Rng & Units 04/30/2022    1:05 PM 01/01/2022    2:57 PM 10/30/2021   10:18 AM  CBC  WBC 4.0 - 10.5 K/uL 4.4  3.9  3.0   Hemoglobin 12.0 - 15.0 g/dL 13.2  12.1  11.0   Hematocrit 36.0 - 46.0 % 38.3  34.7  33.8   Platelets 150 - 400 K/uL 180  163  154         Latest Ref Rng & Units 04/30/2022    1:05 PM 01/01/2022    2:57 PM 10/30/2021   10:18 AM  CMP  Glucose 70 - 99 mg/dL 109  95  101   BUN 8 - 23  mg/dL _0 Creatinine 0.44 - 1.00 mg/dL 0.89  1.03  0.87   Sodium 135 - 145 mmol/L 137  138  138   Potassium 3.5 - 5.1 mmol/L 3.9  3.7  3.7   Chloride 98 - 111 mmol/L 103  104  105   CO2 22 - 32 mmol/L _1 Calcium 8.9 - 10.3 mg/dL 10.1  9.8  9.6   Total Protein 6.5 - 8.1 g/dL 7.3  7.1  6.9   Total Bilirubin 0.3 - 1.2 mg/dL 0.6  0.9  0.8   Alkaline Phos 38 - 126 U/L 55  44  46   AST 15 - 41 U/L _2 ALT 0 - 44 U/L _3 RADIOGRAPHIC STUDIES: I have personally reviewed the radiological images as listed and agreed with the findings in the report. No results found.    No orders of the defined types were placed in this encounter.  All questions were answered. The patient knows to call the clinic with any problems, questions or concerns. No barriers to learning was detected. The total time spent in the appointment was 25 minutes.     Truitt Merle, MD 04/30/2022   I, Wilburn Mylar, am acting as scribe for Truitt Merle, MD.   I have reviewed the above documentation for accuracy and completeness, and I agree with the above.

## 2022-06-10 ENCOUNTER — Ambulatory Visit: Payer: Medicare Other | Admitting: Internal Medicine

## 2022-08-30 DIAGNOSIS — E785 Hyperlipidemia, unspecified: Secondary | ICD-10-CM | POA: Diagnosis not present

## 2022-08-30 DIAGNOSIS — M810 Age-related osteoporosis without current pathological fracture: Secondary | ICD-10-CM | POA: Diagnosis not present

## 2022-08-30 DIAGNOSIS — D509 Iron deficiency anemia, unspecified: Secondary | ICD-10-CM | POA: Diagnosis not present

## 2022-08-30 DIAGNOSIS — Z85038 Personal history of other malignant neoplasm of large intestine: Secondary | ICD-10-CM | POA: Diagnosis not present

## 2022-08-30 DIAGNOSIS — Z1231 Encounter for screening mammogram for malignant neoplasm of breast: Secondary | ICD-10-CM | POA: Diagnosis not present

## 2022-08-30 DIAGNOSIS — I10 Essential (primary) hypertension: Secondary | ICD-10-CM | POA: Diagnosis not present

## 2022-09-20 DIAGNOSIS — Z85038 Personal history of other malignant neoplasm of large intestine: Secondary | ICD-10-CM | POA: Diagnosis not present

## 2022-10-19 DIAGNOSIS — Z1231 Encounter for screening mammogram for malignant neoplasm of breast: Secondary | ICD-10-CM | POA: Diagnosis not present

## 2022-10-29 DIAGNOSIS — Z87891 Personal history of nicotine dependence: Secondary | ICD-10-CM | POA: Diagnosis not present

## 2022-10-29 DIAGNOSIS — Z1211 Encounter for screening for malignant neoplasm of colon: Secondary | ICD-10-CM | POA: Diagnosis not present

## 2022-10-29 DIAGNOSIS — E7800 Pure hypercholesterolemia, unspecified: Secondary | ICD-10-CM | POA: Diagnosis not present

## 2022-10-29 DIAGNOSIS — Z91198 Patient's noncompliance with other medical treatment and regimen for other reason: Secondary | ICD-10-CM | POA: Diagnosis not present

## 2022-10-29 DIAGNOSIS — K219 Gastro-esophageal reflux disease without esophagitis: Secondary | ICD-10-CM | POA: Diagnosis not present

## 2022-10-29 DIAGNOSIS — Z800 Family history of malignant neoplasm of digestive organs: Secondary | ICD-10-CM | POA: Diagnosis not present

## 2022-10-29 DIAGNOSIS — Z538 Procedure and treatment not carried out for other reasons: Secondary | ICD-10-CM | POA: Diagnosis not present

## 2022-10-29 DIAGNOSIS — Z85038 Personal history of other malignant neoplasm of large intestine: Secondary | ICD-10-CM | POA: Diagnosis not present

## 2022-10-29 DIAGNOSIS — M1990 Unspecified osteoarthritis, unspecified site: Secondary | ICD-10-CM | POA: Diagnosis not present

## 2022-10-29 DIAGNOSIS — I10 Essential (primary) hypertension: Secondary | ICD-10-CM | POA: Diagnosis not present

## 2023-03-23 DIAGNOSIS — E785 Hyperlipidemia, unspecified: Secondary | ICD-10-CM | POA: Diagnosis not present

## 2023-03-23 DIAGNOSIS — L989 Disorder of the skin and subcutaneous tissue, unspecified: Secondary | ICD-10-CM | POA: Diagnosis not present

## 2023-03-23 DIAGNOSIS — D509 Iron deficiency anemia, unspecified: Secondary | ICD-10-CM | POA: Diagnosis not present

## 2023-03-23 DIAGNOSIS — Z85038 Personal history of other malignant neoplasm of large intestine: Secondary | ICD-10-CM | POA: Diagnosis not present

## 2023-03-23 DIAGNOSIS — M810 Age-related osteoporosis without current pathological fracture: Secondary | ICD-10-CM | POA: Diagnosis not present

## 2023-03-23 DIAGNOSIS — I10 Essential (primary) hypertension: Secondary | ICD-10-CM | POA: Diagnosis not present

## 2023-04-28 DIAGNOSIS — D485 Neoplasm of uncertain behavior of skin: Secondary | ICD-10-CM | POA: Diagnosis not present

## 2023-06-01 DIAGNOSIS — C4402 Squamous cell carcinoma of skin of lip: Secondary | ICD-10-CM | POA: Diagnosis not present

## 2023-08-07 DIAGNOSIS — R070 Pain in throat: Secondary | ICD-10-CM | POA: Diagnosis not present

## 2023-08-07 DIAGNOSIS — R0981 Nasal congestion: Secondary | ICD-10-CM | POA: Diagnosis not present

## 2023-08-07 DIAGNOSIS — R051 Acute cough: Secondary | ICD-10-CM | POA: Diagnosis not present

## 2023-08-07 DIAGNOSIS — R509 Fever, unspecified: Secondary | ICD-10-CM | POA: Diagnosis not present

## 2023-09-21 DIAGNOSIS — M810 Age-related osteoporosis without current pathological fracture: Secondary | ICD-10-CM | POA: Diagnosis not present

## 2023-09-21 DIAGNOSIS — Z139 Encounter for screening, unspecified: Secondary | ICD-10-CM | POA: Diagnosis not present

## 2023-09-21 DIAGNOSIS — Z9181 History of falling: Secondary | ICD-10-CM | POA: Diagnosis not present

## 2023-09-21 DIAGNOSIS — I10 Essential (primary) hypertension: Secondary | ICD-10-CM | POA: Diagnosis not present

## 2023-09-21 DIAGNOSIS — Z85038 Personal history of other malignant neoplasm of large intestine: Secondary | ICD-10-CM | POA: Diagnosis not present

## 2023-09-21 DIAGNOSIS — Z1231 Encounter for screening mammogram for malignant neoplasm of breast: Secondary | ICD-10-CM | POA: Diagnosis not present

## 2023-09-21 DIAGNOSIS — E785 Hyperlipidemia, unspecified: Secondary | ICD-10-CM | POA: Diagnosis not present

## 2023-09-22 DIAGNOSIS — I10 Essential (primary) hypertension: Secondary | ICD-10-CM | POA: Diagnosis not present

## 2023-09-22 DIAGNOSIS — E785 Hyperlipidemia, unspecified: Secondary | ICD-10-CM | POA: Diagnosis not present

## 2023-10-25 DIAGNOSIS — N289 Disorder of kidney and ureter, unspecified: Secondary | ICD-10-CM | POA: Diagnosis not present

## 2023-11-01 DIAGNOSIS — W109XXA Fall (on) (from) unspecified stairs and steps, initial encounter: Secondary | ICD-10-CM | POA: Diagnosis not present

## 2023-11-01 DIAGNOSIS — S72113A Displaced fracture of greater trochanter of unspecified femur, initial encounter for closed fracture: Secondary | ICD-10-CM | POA: Diagnosis not present

## 2023-11-01 DIAGNOSIS — S72112A Displaced fracture of greater trochanter of left femur, initial encounter for closed fracture: Secondary | ICD-10-CM | POA: Diagnosis not present

## 2023-11-01 DIAGNOSIS — S60221A Contusion of right hand, initial encounter: Secondary | ICD-10-CM | POA: Diagnosis not present

## 2023-11-01 DIAGNOSIS — M79641 Pain in right hand: Secondary | ICD-10-CM | POA: Diagnosis not present

## 2023-11-01 DIAGNOSIS — M25552 Pain in left hip: Secondary | ICD-10-CM | POA: Diagnosis not present

## 2023-11-01 DIAGNOSIS — S72115A Nondisplaced fracture of greater trochanter of left femur, initial encounter for closed fracture: Secondary | ICD-10-CM | POA: Diagnosis not present

## 2023-11-01 DIAGNOSIS — S0990XA Unspecified injury of head, initial encounter: Secondary | ICD-10-CM | POA: Diagnosis not present

## 2023-11-01 DIAGNOSIS — S7002XA Contusion of left hip, initial encounter: Secondary | ICD-10-CM | POA: Diagnosis not present

## 2023-11-14 DIAGNOSIS — S72002A Fracture of unspecified part of neck of left femur, initial encounter for closed fracture: Secondary | ICD-10-CM | POA: Diagnosis not present

## 2023-11-14 DIAGNOSIS — M810 Age-related osteoporosis without current pathological fracture: Secondary | ICD-10-CM | POA: Diagnosis not present

## 2023-11-22 DIAGNOSIS — S72111A Displaced fracture of greater trochanter of right femur, initial encounter for closed fracture: Secondary | ICD-10-CM | POA: Diagnosis not present

## 2024-03-23 DIAGNOSIS — H2513 Age-related nuclear cataract, bilateral: Secondary | ICD-10-CM | POA: Diagnosis not present

## 2024-03-28 DIAGNOSIS — E785 Hyperlipidemia, unspecified: Secondary | ICD-10-CM | POA: Diagnosis not present

## 2024-03-28 DIAGNOSIS — Z1231 Encounter for screening mammogram for malignant neoplasm of breast: Secondary | ICD-10-CM | POA: Diagnosis not present

## 2024-03-28 DIAGNOSIS — M810 Age-related osteoporosis without current pathological fracture: Secondary | ICD-10-CM | POA: Diagnosis not present

## 2024-03-28 DIAGNOSIS — Z85038 Personal history of other malignant neoplasm of large intestine: Secondary | ICD-10-CM | POA: Diagnosis not present

## 2024-03-28 DIAGNOSIS — I10 Essential (primary) hypertension: Secondary | ICD-10-CM | POA: Diagnosis not present

## 2024-03-28 DIAGNOSIS — Z2821 Immunization not carried out because of patient refusal: Secondary | ICD-10-CM | POA: Diagnosis not present
